# Patient Record
Sex: Female | Born: 1990
Health system: Southern US, Community
[De-identification: ages and names within clinical notes are randomized; demographics above are authoritative.]

## PROBLEM LIST (undated history)

## (undated) ENCOUNTER — Inpatient Hospital Stay (HOSPITAL_COMMUNITY): Payer: Self-pay

## (undated) DIAGNOSIS — B379 Candidiasis, unspecified: Secondary | ICD-10-CM

## (undated) DIAGNOSIS — E7800 Pure hypercholesterolemia, unspecified: Secondary | ICD-10-CM

## (undated) DIAGNOSIS — R011 Cardiac murmur, unspecified: Secondary | ICD-10-CM

## (undated) DIAGNOSIS — I10 Essential (primary) hypertension: Secondary | ICD-10-CM

## (undated) DIAGNOSIS — D573 Sickle-cell trait: Secondary | ICD-10-CM

## (undated) DIAGNOSIS — IMO0001 Reserved for inherently not codable concepts without codable children: Secondary | ICD-10-CM

## (undated) DIAGNOSIS — M419 Scoliosis, unspecified: Secondary | ICD-10-CM

## (undated) DIAGNOSIS — D649 Anemia, unspecified: Secondary | ICD-10-CM

## (undated) HISTORY — DX: Candidiasis, unspecified: B37.9

## (undated) HISTORY — DX: Scoliosis, unspecified: M41.9

## (undated) HISTORY — PX: NO PAST SURGERIES: SHX2092

## (undated) HISTORY — DX: Reserved for inherently not codable concepts without codable children: IMO0001

---
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
## 2008-02-15 ENCOUNTER — Ambulatory Visit (HOSPITAL_COMMUNITY): Admission: RE | Admit: 2008-02-15 | Discharge: 2008-02-15 | Payer: Self-pay | Admitting: Family Medicine

## 2008-02-18 ENCOUNTER — Ambulatory Visit: Payer: Self-pay | Admitting: Obstetrics and Gynecology

## 2008-02-18 ENCOUNTER — Inpatient Hospital Stay (HOSPITAL_COMMUNITY): Admission: AD | Admit: 2008-02-18 | Discharge: 2008-02-18 | Payer: Self-pay | Admitting: Family Medicine

## 2008-08-20 ENCOUNTER — Inpatient Hospital Stay (HOSPITAL_COMMUNITY): Admission: AD | Admit: 2008-08-20 | Discharge: 2008-08-22 | Payer: Self-pay | Admitting: Obstetrics and Gynecology

## 2009-01-11 ENCOUNTER — Inpatient Hospital Stay (HOSPITAL_COMMUNITY): Admission: AD | Admit: 2009-01-11 | Discharge: 2009-01-11 | Payer: Self-pay | Admitting: Obstetrics and Gynecology

## 2009-03-10 ENCOUNTER — Inpatient Hospital Stay (HOSPITAL_COMMUNITY): Admission: AD | Admit: 2009-03-10 | Discharge: 2009-03-10 | Payer: Self-pay | Admitting: Obstetrics and Gynecology

## 2009-07-18 ENCOUNTER — Inpatient Hospital Stay (HOSPITAL_COMMUNITY): Admission: AD | Admit: 2009-07-18 | Discharge: 2009-07-18 | Payer: Self-pay | Admitting: Obstetrics & Gynecology

## 2009-07-19 ENCOUNTER — Inpatient Hospital Stay (HOSPITAL_COMMUNITY): Admission: AD | Admit: 2009-07-19 | Discharge: 2009-07-20 | Payer: Self-pay | Admitting: Obstetrics and Gynecology

## 2009-08-05 ENCOUNTER — Inpatient Hospital Stay (HOSPITAL_COMMUNITY): Admission: AD | Admit: 2009-08-05 | Discharge: 2009-08-05 | Payer: Self-pay | Admitting: Obstetrics & Gynecology

## 2009-08-05 ENCOUNTER — Emergency Department (HOSPITAL_COMMUNITY): Admission: EM | Admit: 2009-08-05 | Discharge: 2009-08-05 | Payer: Self-pay | Admitting: Emergency Medicine

## 2009-12-19 ENCOUNTER — Inpatient Hospital Stay (HOSPITAL_COMMUNITY): Admission: AD | Admit: 2009-12-19 | Discharge: 2009-12-20 | Payer: Self-pay | Admitting: Obstetrics and Gynecology

## 2009-12-19 ENCOUNTER — Ambulatory Visit: Payer: Self-pay | Admitting: Nurse Practitioner

## 2010-06-20 LAB — URINALYSIS, ROUTINE W REFLEX MICROSCOPIC
Nitrite: NEGATIVE
Specific Gravity, Urine: 1.02 (ref 1.005–1.030)
pH: 7 (ref 5.0–8.0)

## 2010-06-20 LAB — CBC
HCT: 40.8 % (ref 36.0–46.0)
Hemoglobin: 13.2 g/dL (ref 12.0–15.0)
RDW: 12.9 % (ref 11.5–15.5)
WBC: 12.4 10*3/uL — ABNORMAL HIGH (ref 4.0–10.5)

## 2010-06-20 LAB — POCT PREGNANCY, URINE: Preg Test, Ur: NEGATIVE

## 2010-06-20 LAB — URINE MICROSCOPIC-ADD ON

## 2010-06-20 LAB — WET PREP, GENITAL: Yeast Wet Prep HPF POC: NONE SEEN

## 2010-06-26 LAB — URINALYSIS, ROUTINE W REFLEX MICROSCOPIC
Bilirubin Urine: NEGATIVE
Hgb urine dipstick: NEGATIVE
Specific Gravity, Urine: 1.02 (ref 1.005–1.030)
Urobilinogen, UA: 0.2 mg/dL (ref 0.0–1.0)

## 2010-06-26 LAB — WET PREP, GENITAL: Trich, Wet Prep: NONE SEEN

## 2010-06-26 LAB — GC/CHLAMYDIA PROBE AMP, GENITAL: GC Probe Amp, Genital: NEGATIVE

## 2010-07-09 LAB — CBC
HCT: 40.3 % (ref 36.0–46.0)
Hemoglobin: 13.7 g/dL (ref 12.0–15.0)
MCHC: 33.9 g/dL (ref 30.0–36.0)
MCV: 92 fL (ref 78.0–100.0)
RBC: 4.38 MIL/uL (ref 3.87–5.11)

## 2010-07-09 LAB — WET PREP, GENITAL: Clue Cells Wet Prep HPF POC: NONE SEEN

## 2010-07-09 LAB — URINALYSIS, ROUTINE W REFLEX MICROSCOPIC
Glucose, UA: NEGATIVE mg/dL
Hgb urine dipstick: NEGATIVE
Protein, ur: NEGATIVE mg/dL
Specific Gravity, Urine: 1.02 (ref 1.005–1.030)
pH: 7.5 (ref 5.0–8.0)

## 2010-07-09 LAB — DIFFERENTIAL
Basophils Relative: 0 % (ref 0–1)
Eosinophils Absolute: 0.1 10*3/uL (ref 0.0–0.7)
Lymphs Abs: 1.7 10*3/uL (ref 0.7–4.0)
Monocytes Absolute: 0.6 10*3/uL (ref 0.1–1.0)
Monocytes Relative: 10 % (ref 3–12)

## 2010-07-09 LAB — URINE MICROSCOPIC-ADD ON

## 2010-07-09 LAB — URINE CULTURE

## 2010-07-11 LAB — URINALYSIS, ROUTINE W REFLEX MICROSCOPIC
Ketones, ur: NEGATIVE mg/dL
Leukocytes, UA: NEGATIVE
Nitrite: POSITIVE — AB
Specific Gravity, Urine: >1.03 — ABNORMAL HIGH (ref 1.005–1.030)
Urobilinogen, UA: 0.2 mg/dL (ref 0.0–1.0)
pH: 6 (ref 5.0–8.0)

## 2010-07-11 LAB — URINE CULTURE: Colony Count: >=100000

## 2010-07-11 LAB — POCT PREGNANCY, URINE: Preg Test, Ur: NEGATIVE

## 2010-07-11 LAB — URINE MICROSCOPIC-ADD ON: RBC / HPF: NONE SEEN RBC/hpf (ref <3)

## 2010-07-16 LAB — CBC
Hemoglobin: 14.2 g/dL (ref 12.0–15.0)
MCHC: 35.8 g/dL (ref 30.0–36.0)
MCV: 95.7 fL (ref 78.0–100.0)
Platelets: 133 10*3/uL — ABNORMAL LOW (ref 150–400)
Platelets: 162 10*3/uL (ref 150–400)
RBC: 3.23 MIL/uL — ABNORMAL LOW (ref 3.87–5.11)
RDW: 12.7 % (ref 11.5–15.5)
WBC: 14.4 10*3/uL — ABNORMAL HIGH (ref 4.0–10.5)

## 2010-07-16 LAB — RPR
RPR Ser Ql: NONREACTIVE
RPR Ser Ql: NONREACTIVE

## 2010-08-20 NOTE — H&P (Signed)
NAME:  Allison Mathews, Allison Mathews            ACCOUNT NO.:  [id number]   MEDICAL RECORD NO.:  [id number]          PATIENT TYPE:  INP   LOCATION:  9165                          F[facility]ILITY:  [facility]   PHYSICIAN:  Hal Morales, M.D.DATE OF BIRTH:  1990-11-03   DATE OF ADMISSION:  08/20/2008  DATE OF DISCHARGE:                              HISTORY & PHYSICAL   Patient is a 20 year old, single, black female, gravida 2, para 0-0-1-0  at 39-6/7 weeks per an [facility] of Aug 21, 2008.  Presents in active labor.  Denies leakage of fluid.  Reports good fetal movement.  Irregular  contractions since last night before going to bed with a worsening pain  around 5 a.m., was able to sleep some during the night.  She was 2 cm  and 100% effaced in the office at the end of the week.  Followed by CNM  service at [facility].   HISTORY:  Remarkable for:  1. Recurrent UTIs.  2. GBS positive.  3. Mild scoliosis.  4. Questionable support and psychosocial issues.   PRENATAL LABS:  Blood type O+.  Rh antibody screen negative.  Sickle  cell screen negative.  RPR nonreactive.  Rubella titer immune.  Hepatitis surface antigen nonreactive.  HIV nonreactive.  Hepatitis C  was negative.  Urine culture March 18, 2008, was positive for staph.  HSV II is negative.  GC and chlamydia negative.  Quad screen was done  March 08, 2008, and was within normal limits.  Hemoglobin March 18, 2008, 12.5, hematocrit 32.2, platelets were 220.   ALLERGIES:  DENIES MEDICATION OR LATEX ALLERGIES.   MENSTRUAL HISTORY:  Menarche at age 81.  Has irregular cycles, usually  about 60 days in between periods with 5 days of flow when they do come.  Reported an LMP of November 15, 2007, gave her an [facility] of Aug 23, 2008, by  ultrasound, was given due date of Aug 21, 2008.   OBSTETRICAL HISTORY:  Gravida 1.  Spontaneous abortion at 6 to 8 weeks  that was in 2008, passed naturally, no complications.  Gravida 2 is  current pregnancy.   PAST MEDICAL  HISTORY:  For contraception, she has used condoms in the  past.  Prior to this pregnancy, she had never had a Pap smear.  Reports  varicella as a child.  Reported diagnosed with a heart murmur in 2009,  had an echocardiogram around 13 weeks.   GENETIC HISTORY:  Unremarkable.   FAMILY HISTORY:  Paternal aunt, high blood pressure and on medication.  Sister, anemia.  Dad, paternal grandmother, and paternal aunt, diabetic.   SOCIAL HISTORY:  She is a single black female.  She is of [place]  faith.  The father of the baby's name is Barrie Lyme.  Patient has had a  high school education and works part time at [employer].  Father of the baby  is unemployed.  Denied alcohol, tobacco, or illicit drug use.   HISTORY OF PRESENT PREGNANCY:  She had her new OB interview around 69-  1/7 weeks on March 07, 2008.  Returned on March 08, 2008, for her  workup.  She reported a pregravid weight of 115.  She had a normal BMI.  Was treated for a UTI after being seen at [facility].  She did  receive H1N1 on March 08, 2008.  Also treated for a yeast infection.  Had Pap smear on March 13, 2008, Pap was within normal limits.  Had  anatomy ultrasound at 18-3/7 weeks on March 23, 2008, and size was  equal to dates.  Cervix was 3.13 cm.  Anatomy within normal limits and a  posterior placenta.  Did have episode of diarrhea April 03, 2008.  Had her Glucola at 27-3/7 weeks and it was within normal limits, equal  to 106.  Hemoglobin was 12.3.  RPR was negative.  She had some  intermittent contractions around 28 weeks.  She had a fetal fibronectin  that was negative at that time.  Gonorrhea and chlamydia cultures were  negative and her cervix was closed and long.  Had an ultrasound at 32  and 3 for decreased weight gain.  AFI was 40th percentile, weight was 4  pounds, and cervix was 3.62.  had some issues with her appetite in 3rd  trimester.  She did measure size equal to dates.  Her prenatal record   available ends at 34-3/7 weeks.  Her weight gain at that point was 17  pounds overall.  GBS is positive in 3rd trimester.   OBJECTIVE:  VITAL SIGNS:  On admission, 128/76, heart rate 85,  respirations 20, temperature 98.4.  Fetal heart rate 140, reactive,  occasional variable.  TOCO.  Uterine contractions every 1 to 3 minutes.  Moderate on palpation.   PHYSICAL EXAM:  GENERAL:  She did have some labored breathing but was  coping well with her contractions, was alert and oriented x3.  HEENT:  Within normal limits and grossly intact.  CARDIOVASCULAR:  Regular rate and rhythm.  LUNGS:  Clear to auscultation bilaterally.  ABDOMEN:  Soft, nontender, and gravid.  Cervix per an RN exam was 8,  100%, -1 to 0 station.  EXTREMITIES:  No edema, no clonus, and DTRs were 1 to 2+.   IMPRESSION:  1. Intrauterine pregnancy at 39-6/7 weeks.  2. GBS positive.  3. Active labor.  4. Desires epidural.   PLAN:  1. Admitted to birthing suites with Dr. Pennie Rushing as attending      physician.  2. Routine L and D orders.  3. Ampicillin IV per GBS protocol for advanced dilatation.  4. Epidural as soon as possible.  5. We will perform artificial rupture of membranes when comfortable      after epidural.      Candice Denny Levy, CNM      Hal Morales, M.D.  Electronically Signed    CHS/MEDQ  D:  08/20/2008  T:  08/20/2008  Job:  [id number]

## 2010-08-20 NOTE — Op Note (Signed)
NAME:  Allison Mathews, Allison Mathews            ACCOUNT NO.:  [id number]   MEDICAL RECORD NO.:  [id number]          PATIENT TYPE:  INP   LOCATION:  9106                          [facility]  [facility]   PHYSICIAN:  Hal Morales, M.D.DATE OF BIRTH:  [birth year]   DATE OF PROCEDURE:  08/20/2008  DATE OF DISCHARGE:                               OPERATIVE REPORT   PREOPERATIVE DIAGNOSES:  Intrauterine pregnancy at term, prolonged  second stage of labor, terminal bradycardia.   POSTOPERATIVE DIAGNOSES:  Intrauterine pregnancy at term, prolonged  second stage of labor, terminal bradycardia, double foot umbilical cord.   OPERATION:  Vacuum-assisted vaginal delivery, repair of second-degree  labial laceration.   ANESTHESIA:  Epidural and local.   ESTIMATED BLOOD LOSS:  Less than 500 mL.   COMPLICATIONS:  None.   FINDINGS:  The patient was delivered of a female infant whose name is  [name], weighing 6 pounds 8 ounces with Apgars of 8 and 9 and 1 and 5  minutes respectively.   PROCEDURE IN DETAIL:  The patient had been pushing initially for  approximately 1 hour and then requested resting.  She began pushing  again and after about 30 minutes of intermittent variable decelerations  began to have persistent decrease in the fetal heart rate baseline to  the 100-110 baseline with decreases down as low as the 70s.  At this  point, the fetus was in the ROA position and was at +3 station.  A  discussion had been held with the parents, concerning options for  management of either prolonged second stage or nonreassuring fetal heart  rate tracing and that those options included vacuum-assisted vaginal  delivery and cesarean section.  The risks of each had been explained to  them and they had decided that should that situational occur, they  wanted to start with an attempted vacuum-assisted vaginal delivery.  It  was thus advised that the patient be prepared for vacuum and she agreed.  Her Foley catheter had been  in place during her pushing and was removed  just before her delivery.  Once the Foley catheter had been removed with  the patient in lithotomy position, the Kiwi vacuum extractor was placed  over the fetal vertex and with a single pole.  The vertex was brought  over the perineum.  The vacuum was released and the remainder of the  infant delivered with a combination of maternal expulsive efforts and  gentle traction.  There was noted to be a double loop of umbilical cord  tightly around the infant's left button.  This was reduced and the  infant suctioned.  The cord was then clamped and was cut by the father  of the baby.  The infant was placed in the isolette for evaluation by  the attending pediatricians.  The placenta was then delivered with a  combination of maternal expulsive efforts and gentle traction, and given  to the employees of [employer] for cord blood  storage.  There was a small second-degree laceration of the left labium  that was repaired in a running fashion with a suture of 3-0 Vicryl.  An  ice pack was then applied to the perineum.  The mother was left to  recover in the LDR area and the infant was left for initial infant  bonding with expected admission to the full-term nursery.       Hal Morales, M.D.  Electronically Signed     VPH/MEDQ  D:  08/20/2008  T:  08/21/2008  Job:  [id number]

## 2011-01-02 LAB — URINALYSIS, ROUTINE W REFLEX MICROSCOPIC
Bilirubin Urine: NEGATIVE
Nitrite: NEGATIVE
Specific Gravity, Urine: 1.02
Urobilinogen, UA: 0.2
pH: 6

## 2011-01-03 LAB — HCG, SERUM, QUALITATIVE: Preg, Serum: NEGATIVE

## 2011-01-07 LAB — URINE MICROSCOPIC-ADD ON

## 2011-01-07 LAB — URINALYSIS, ROUTINE W REFLEX MICROSCOPIC
Leukocytes, UA: NEGATIVE
Nitrite: POSITIVE — AB
Specific Gravity, Urine: >1.03 — ABNORMAL HIGH
pH: 5.5

## 2011-01-07 LAB — CBC
Hemoglobin: 12.6
Platelets: 203
RDW: 12.7

## 2011-01-07 LAB — URINE CULTURE: Colony Count: >=100000

## 2011-01-13 LAB — HCG, QUANTITATIVE, PREGNANCY: hCG, Beta Chain, Quant, S: 630 — ABNORMAL HIGH

## 2011-01-13 LAB — GC/CHLAMYDIA PROBE AMP, GENITAL
Chlamydia, DNA Probe: NEGATIVE
GC Probe Amp, Genital: NEGATIVE

## 2011-01-13 LAB — WET PREP, GENITAL
Trich, Wet Prep: NONE SEEN
WBC, Wet Prep HPF POC: NONE SEEN
Yeast Wet Prep HPF POC: NONE SEEN

## 2011-01-13 LAB — ABO/RH: ABO/RH(D): O POS

## 2011-01-13 LAB — CBC
MCHC: 35.2
Platelets: 236
RDW: 12.6

## 2011-03-01 ENCOUNTER — Inpatient Hospital Stay (HOSPITAL_COMMUNITY)
Admission: AD | Admit: 2011-03-01 | Discharge: 2011-03-01 | Disposition: A | Discharge status: Home / Self Care | Payer: Self-pay | Source: Ambulatory Visit | Attending: Obstetrics and Gynecology | Admitting: Obstetrics and Gynecology

## 2011-03-01 ENCOUNTER — Encounter (HOSPITAL_COMMUNITY): Payer: Self-pay | Admitting: *Deleted

## 2011-03-01 DIAGNOSIS — M549 Dorsalgia, unspecified: Secondary | ICD-10-CM | POA: Insufficient documentation

## 2011-03-01 DIAGNOSIS — R109 Unspecified abdominal pain: Secondary | ICD-10-CM | POA: Insufficient documentation

## 2011-03-01 DIAGNOSIS — N946 Dysmenorrhea, unspecified: Secondary | ICD-10-CM | POA: Insufficient documentation

## 2011-03-01 HISTORY — DX: Cardiac murmur, unspecified: R01.1

## 2011-03-01 HISTORY — DX: Anemia, unspecified: D64.9

## 2011-03-01 LAB — WET PREP, GENITAL
Clue Cells Wet Prep HPF POC: NONE SEEN
Yeast Wet Prep HPF POC: NONE SEEN

## 2011-03-01 LAB — URINE MICROSCOPIC-ADD ON

## 2011-03-01 LAB — URINALYSIS, ROUTINE W REFLEX MICROSCOPIC
Bilirubin Urine: NEGATIVE
Glucose, UA: NEGATIVE mg/dL
Protein, ur: NEGATIVE mg/dL
Urobilinogen, UA: 0.2 mg/dL (ref 0.0–1.0)

## 2011-03-01 LAB — POCT PREGNANCY, URINE: Preg Test, Ur: NEGATIVE

## 2011-03-01 MED ORDER — IBUPROFEN 400 MG PO TABS
400.0000 mg | ORAL_TABLET | Freq: Once | ORAL | Status: AC
  Administered 2011-03-01: 400 mg via ORAL
  Filled 2011-03-01: qty 1

## 2011-03-01 NOTE — Progress Notes (Signed)
Patient presents wit onset of sharp pains around her back to her stomach since yesterday thinks started her period today was late her period.

## 2011-03-01 NOTE — ED Notes (Signed)
Pamelia Hoit RNP in with pt to discuss plan of care

## 2011-04-08 NOTE — L&D Delivery Note (Signed)
Delivery Note Pt complete at 1754, and with urge to push.  Pushing started at around 1816.  FHT with some variables and brief bradycardia while pushing, but good recovery w/ position changes and moderate variability throughout.  At 7:04 PM a viable female "Allison Mathews" was delivered via Vaginal, Spontaneous Delivery (Presentation: ; Occiput Anterior).  APGAR: 8, 9; weight-not done at time of this note.  Newborn w/ spontaneous, lusty cry as body delivered.     Placenta status: Intact, Spontaneous, Schultz.  Cord: 3 vessels with the following complications: None.  Cord pH: not collected  Anesthesia: Epidural  Episiotomy: None Lacerations: None Suture Repair: n/a Est. Blood Loss (mL): 150  Mom to postpartum.  Baby to nursery-stable. Plans outpt circ and to breastfeed.  [name] H 12/29/2011, 7:30 PM

## 2011-05-23 ENCOUNTER — Inpatient Hospital Stay (HOSPITAL_COMMUNITY)
Admission: AD | Admit: 2011-05-23 | Discharge: 2011-05-24 | Disposition: A | Discharge status: Home / Self Care | Payer: Self-pay | Source: Ambulatory Visit | Attending: Obstetrics & Gynecology | Admitting: Obstetrics & Gynecology

## 2011-05-23 ENCOUNTER — Encounter (HOSPITAL_COMMUNITY): Payer: Self-pay | Admitting: Obstetrics and Gynecology

## 2011-05-23 DIAGNOSIS — O219 Vomiting of pregnancy, unspecified: Secondary | ICD-10-CM

## 2011-05-23 DIAGNOSIS — K3189 Other diseases of stomach and duodenum: Secondary | ICD-10-CM | POA: Insufficient documentation

## 2011-05-23 DIAGNOSIS — O210 Mild hyperemesis gravidarum: Secondary | ICD-10-CM | POA: Insufficient documentation

## 2011-05-23 DIAGNOSIS — Z3490 Encounter for supervision of normal pregnancy, unspecified, unspecified trimester: Secondary | ICD-10-CM

## 2011-05-23 DIAGNOSIS — Z1389 Encounter for screening for other disorder: Secondary | ICD-10-CM

## 2011-05-23 DIAGNOSIS — K30 Functional dyspepsia: Secondary | ICD-10-CM

## 2011-05-23 DIAGNOSIS — R1013 Epigastric pain: Secondary | ICD-10-CM | POA: Insufficient documentation

## 2011-05-23 LAB — POCT PREGNANCY, URINE: Preg Test, Ur: POSITIVE — AB

## 2011-05-23 MED ORDER — FAMOTIDINE 20 MG PO TABS
20.0000 mg | ORAL_TABLET | Freq: Once | ORAL | Status: AC
  Administered 2011-05-24: 20 mg via ORAL
  Filled 2011-05-23: qty 1

## 2011-05-23 NOTE — Progress Notes (Signed)
Pt  States, " I've had nausea and acid reflux for a couple of weeks. I missed my period  And had a positive HPT."

## 2011-05-23 NOTE — ED Provider Notes (Signed)
Chief Complaint:  Nausea and Possible Pregnancy   Allison Mathews is  21 y.o. G2P1001.  Patient's last menstrual period was 03/31/2011.Marland Kitchen  Her pregnancy status is positive. [redacted]w[redacted]d by LMP  She presents complaining of Nausea and Possible Pregnancy  Reports nausea and heartburn x 1 week. +UPT at home uncertain what to take. Desires confirmation of pregnancy and medication for nausea. Denies vaginal bleeding, discharge, abd pain, or urinary symptoms  Obstetrical/Gynecological History: OB History    Grav Para Term Preterm Abortions TAB SAB Ect Mult Living   2 1 1       1       Past Medical History: Past Medical History  Diagnosis Date  . Anemia   . Heart murmur     Past Surgical History: Past Surgical History  Procedure Date  . No past surgeries     Family History: Family History  Problem Relation Age of Onset  . Anesthesia problems Neg Hx   . Hypotension Neg Hx   . Malignant hyperthermia Neg Hx   . Pseudochol deficiency Neg Hx   . Diabetes Father   . Hypertension Paternal Aunt   . Diabetes Paternal Grandmother     Social History: History  Substance Use Topics  . Smoking status: Never Smoker   . Smokeless tobacco: Never Used  . Alcohol Use: No    Allergies: No Known Allergies  No prescriptions prior to admission    Review of Systems - Negative except what has been reviewed in HPI  Physical Exam   Blood pressure 124/76, pulse 56, temperature 98.7 F (37.1 C), temperature source Oral, resp. rate 16, height 4\' 11"  (1.499 m), weight 138 lb (62.596 kg), last menstrual period 03/31/2011.  General: General appearance - alert, well appearing, and in no distress, oriented to person, place, and time and normal appearing weight Mental status - alert, oriented to person, place, and time, normal mood, behavior, speech, dress, motor activity, and thought processes, affect appropriate to mood Abdomen - soft, nontender, nondistended, no masses or organomegaly no CVA  tenderness Focused Gynecological Exam: examination not indicated  Labs: Recent Results (from the past 24 hour(s))  POCT PREGNANCY, URINE   Collection Time   05/23/11 11:20 PM      Component Value Range   Preg Test, Ur POSITIVE (*) NEGATIVE    Imaging Studies:  Bedside US: IUP + yolk sac, + embryo with cardiac activity. CRL: [redacted]w[redacted]d. Images archived c/w LMP dating.    Assessment: IUP Nausea Indigestion  Plan: Discharge home Pregnancy verification letter, MCD info, and provider list given Rx Phenergan and pepcid sent to pharmacy Precautions reviewed  Monty Spicher E. 05/23/2011,11:57 PM

## 2011-05-24 ENCOUNTER — Encounter (HOSPITAL_COMMUNITY): Payer: Self-pay | Admitting: Physician Assistant

## 2011-05-24 MED ORDER — PROMETHAZINE HCL 25 MG PO TABS: 25.0000 mg | ORAL_TABLET | Freq: Four times a day (QID) | ORAL | Status: DC | PRN

## 2011-05-24 MED ORDER — FAMOTIDINE 20 MG PO TABS: 20.0000 mg | ORAL_TABLET | Freq: Two times a day (BID) | ORAL | Status: DC

## 2011-05-24 NOTE — Discharge Instructions (Signed)
Indigestion  Indigestion is an upset stomach. It can happen when you eat too much, too fast, or when you eat a type of food your body does not react well to. HOME CARE  Sit upright when you have indigestion.   Avoid foods that have caused indigestion in the past.   Eat small meals.   Do not eat within 3 hours of going to bed.   Avoid wearing clothes that are tight around your chest or belly (abdomen).   Stop smoking if you smoke.   Limit stress.   Eat a healthy diet. Avoid spicy and fatty foods, peppermint, tomato products, and citrus or fruit juices.   Limit alcohol and caffeine.   Avoid aspirin and other anti-inflammatory drugs.  GET HELP RIGHT AWAY IF:  You have chest pressure.   You have pain that spreads into your neck, arms, back, or upper belly.   You have trouble swallowing or breathing.   You keep throwing up (vomiting) or have blood in your poop (stool).   You have a temperature by mouth above 102 F (38.9 C), not controlled by medicine.   You become dizzy or pass out (faint).  MAKE SURE YOU:  Understand these instructions.   Will watch your condition.   Will get help right away if you are not doing well or get worse.  Document Released: 04/26/2010 Document Revised: 10/07/2010 Document Reviewed: 04/26/2010 [facility] Patient Information 2012 [place], [place].Morning Sickness Morning sickness is when you feel sick to your stomach (nauseous) during pregnancy. This nauseous feeling may or may not come with throwing up (vomiting). It often occurs in the morning, but can be a problem any time of day. While morning sickness is unpleasant, it is usually harmless unless you develop severe and continual vomiting (hyperemesis gravidarum). This condition requires more intense treatment. CAUSES  The cause of morning sickness is not completely known but seems to be related to a sudden increase of two hormones:   Human chorionic gonadotropin (hCG).   Estrogen hormone.    These are elevated in the first part of the pregnancy. TREATMENT  Do not use any medicines (prescription, over-the-counter, or herbal) for morning sickness without first talking to your caregiver. Some patients are helped by the following:  Vitamin B6 (25mg  every 8 hours) or vitamin B6 shots.   An antihistamine called doxylamine (10mg  every 8 hours).   The herbal medication ginger.  HOME CARE INSTRUCTIONS   Taking multivitamins before getting pregnant can prevent or decrease the severity of morning sickness in most women.   Eat a piece of dry toast or unsalted crackers before getting out of bed in the morning.   Eat 5 or 6 small meals a day.   Eat dry and bland foods (rice, baked potato).   Do not drink liquids with your meals. Drink liquids between meals.   Avoid greasy, fatty, and spicy foods.   Get someone to cook for you if the smell of any food causes nausea and vomiting.   Avoid vitamin pills with iron because iron can cause nausea.   Snack on protein foods between meals if you are hungry.   Eat unsweetened gelatins for deserts.   Wear an acupressure wristband (worn for sea sickness) may be helpful.   Acupuncture may be helpful.   Do not smoke.   Get a humidifier to keep the air in your house free of odors.  SEEK MEDICAL CARE IF:   Your home remedies are not working and you need medication.  You feel dizzy or lightheaded.   You are losing weight.   You need help with your diet.  SEEK IMMEDIATE MEDICAL CARE IF:   You have persistent and uncontrolled nausea and vomiting.   You pass out (faint).   You have a fever.  MAKE SURE YOU:   Understand these instructions.   Will watch your condition.   Will get help right away if you are not doing well or get worse.  Document Released: 05/15/2006 Document Revised: 12/04/2010 Document Reviewed: 03/12/2007 [employer] Patient Information 2012 [place].   ________________________________________     To schedule your Maternity Eligibility Appointment, please call [phone].  When you arrive for your appointment you must bring the following items or information listed below.  Your appointment will be rescheduled if you do not have these items or are 15 minutes late. If currently receiving Medicaid, you MUST bring: 1. Medicaid Card 2. Social Security Card 3. Picture ID 4. Proof of Pregnancy 5. Verification of current address if the address on Medicaid card is incorrect "postmarked mail" If not receiving Medicaid, you MUST bring: 1. Social Security Card 2. Picture ID 3. Birth Certificate (if available) Passport or *Green Card 4. Proof of Pregnancy 5. Verification of current address "postmarked mail" for each income presented. 6. Verification of insurance coverage, if any 7. Check stubs from each employer for the previous month (if unable to present check stub  for each week, we will accept check stub for the first and last week ill the same month.) If you can't locate check stubs, you must bring a letter from the employer(s) and it must have the following information on letterhead, typed, in English: o name of company o company telephone number o how long been with the company, if less than one month o how much person earns per hour o how many hours per week work o the gross pay the person earned for the previous month If you are 23 years old or less, you do not have to bring proof of income unless you work or live with the father of the baby and at that time we will need proof of income from you and/or the father of the baby. Green Card recipients are eligible for Medicaid for Pregnant Women (MPW)   Prenatal Care Providers [facility]    [facility]  [phone]     Phone: [phone]          [facility]                      [facility]  @[place]     Phone: [phone]  Phone: [phone]         [facility]     Phone: [phone]  Phone: [phone]           [facility] [place]                hone: [phone]  Phone: [phone]         [facility]      Phone: [phone]  Phone: [phone]         [facility]                Phone: [phone]  [facility]   Phone:[phone]    [facility] [address] [place])  Phone: [phone] [facility]   Phone: [phone]

## 2011-06-28 NOTE — ED Provider Notes (Signed)
Medical Screening exam and patient care preformed by advanced practice provider.  Agree with the above management.  

## 2011-07-03 ENCOUNTER — Encounter (HOSPITAL_COMMUNITY): Payer: Self-pay | Admitting: *Deleted

## 2011-07-03 ENCOUNTER — Inpatient Hospital Stay (HOSPITAL_COMMUNITY)
Admission: AD | Admit: 2011-07-03 | Discharge: 2011-07-03 | Disposition: A | Discharge status: Home / Self Care | Payer: Medicaid Other | Source: Ambulatory Visit | Attending: Family Medicine | Admitting: Family Medicine

## 2011-07-03 DIAGNOSIS — R1013 Epigastric pain: Secondary | ICD-10-CM | POA: Insufficient documentation

## 2011-07-03 DIAGNOSIS — O99891 Other specified diseases and conditions complicating pregnancy: Secondary | ICD-10-CM | POA: Insufficient documentation

## 2011-07-03 DIAGNOSIS — R0602 Shortness of breath: Secondary | ICD-10-CM | POA: Insufficient documentation

## 2011-07-03 DIAGNOSIS — J069 Acute upper respiratory infection, unspecified: Secondary | ICD-10-CM | POA: Insufficient documentation

## 2011-07-03 DIAGNOSIS — O219 Vomiting of pregnancy, unspecified: Secondary | ICD-10-CM

## 2011-07-03 HISTORY — DX: Sickle-cell trait: D57.3

## 2011-07-03 LAB — URINALYSIS, ROUTINE W REFLEX MICROSCOPIC
Bilirubin Urine: NEGATIVE
Glucose, UA: NEGATIVE mg/dL
Hgb urine dipstick: NEGATIVE
Protein, ur: NEGATIVE mg/dL
Specific Gravity, Urine: 1.025 (ref 1.005–1.030)
Urobilinogen, UA: 0.2 mg/dL (ref 0.0–1.0)

## 2011-07-03 MED ORDER — IPRATROPIUM BROMIDE 0.02 % IN SOLN
0.5000 mg | Freq: Once | RESPIRATORY_TRACT | Status: AC
  Administered 2011-07-03: 0.5 mg via RESPIRATORY_TRACT
  Filled 2011-07-03: qty 2.5

## 2011-07-03 MED ORDER — ONDANSETRON 8 MG PO TBDP: 8.0000 mg | ORAL_TABLET | Freq: Three times a day (TID) | ORAL | Status: AC | PRN

## 2011-07-03 MED ORDER — RANITIDINE HCL 150 MG PO CAPS: 150.0000 mg | ORAL_CAPSULE | Freq: Every day | ORAL | Status: DC

## 2011-07-03 MED ORDER — ALBUTEROL SULFATE (5 MG/ML) 0.5% IN NEBU
2.5000 mg | INHALATION_SOLUTION | Freq: Once | RESPIRATORY_TRACT | Status: AC
  Administered 2011-07-03: 2.5 mg via RESPIRATORY_TRACT
  Filled 2011-07-03: qty 0.5

## 2011-07-03 NOTE — MAU Note (Signed)
Denies urinary symptoms

## 2011-07-03 NOTE — MAU Provider Note (Signed)
Chart reviewed and agree with management and plan.  

## 2011-07-03 NOTE — MAU Note (Signed)
Pt brought back directly.  No apparent distress.  Breathing with mouth closed. Did not need to pause when speaking.  Cramping for about 2 wks, upper abd.  Breathing problem since beginning of preg, hx of allergies, thinks it might be asthma.

## 2011-07-03 NOTE — MAU Provider Note (Signed)
History     CSN: [id number]  Arrival date & time 07/03/11  4098   None     Chief Complaint  Patient presents with  . Shortness of Breath   HPI Allison Mathews is a 21 y.o. female @ [redacted]w[redacted]d gestation who presents to MAU for feeling like wheezing.  No history of asthma but has allergies and afraid she may be developing asthma. She states that SOB comes and goes as random and is worse after eating and when lying down to sleep for approximately the past 2 weeks. SOB is accompanied by my pressure in lower sternal/epigastric area which is improved when patient lifts breasts off abdomen with hands.  In addition patient c/o of intermittent diffuse abdominal cramping.  Reports hx of heart murmur she states was previously evaluated by cardiology without any indicated intervention.  She denies CP, leg pain or swelling, dysuria, hematuria, vaginal bleeding or discharge.  Previously prescribed phenergen and pepcid for indigestion by MAU provided but decided to discontinue use 2 weeks ago because she felt that medications made her drowsy.  Past Medical History  Diagnosis Date  . Anemia   . Heart murmur   . Sickle cell trait     Past Surgical History  Procedure Date  . No past surgeries     Family History  Problem Relation Age of Onset  . Anesthesia problems Neg Hx   . Hypotension Neg Hx   . Malignant hyperthermia Neg Hx   . Pseudochol deficiency Neg Hx   . Diabetes Father   . Hypertension Paternal Aunt   . Diabetes Paternal Grandmother     History  Substance Use Topics  . Smoking status: Never Smoker   . Smokeless tobacco: Never Used  . Alcohol Use: No    OB History    Grav Para Term Preterm Abortions TAB SAB Ect Mult Living   2 1 1       1       Review of Systems  Constitutional: Positive for fatigue (patient reports mild decrease in energy level). Negative for fever, chills, diaphoresis, activity change and appetite change.  HENT: Negative for congestion, facial swelling,  rhinorrhea, neck pain and neck stiffness.   Eyes: Negative for visual disturbance.  Respiratory: Positive for chest tightness (mild lower sternal/epigastric intermittent pressure), shortness of breath and wheezing. Negative for apnea, cough, choking and stridor.   Cardiovascular: Negative for chest pain, palpitations and leg swelling.  Gastrointestinal: Negative for abdominal distention.  Genitourinary: Positive for frequency. Negative for dysuria, urgency, hematuria, flank pain, vaginal bleeding, vaginal discharge, difficulty urinating, vaginal pain and pelvic pain.  Musculoskeletal: Negative for joint swelling and gait problem.  Neurological: Negative for dizziness, tremors, seizures, syncope, facial asymmetry, speech difficulty, weakness, light-headedness, numbness and headaches.  Hematological: Negative for adenopathy.  Psychiatric/Behavioral: Negative for hallucinations, behavioral problems, confusion, dysphoric mood, decreased concentration and agitation.    Allergies  Review of patient's allergies indicates no known allergies.  Home Medications   Current Outpatient Rx  Name Route Sig Dispense Refill  . PROMETHAZINE HCL 25 MG PO TABS Oral Take 1 tablet (25 mg total) by mouth every 6 (six) hours as needed for nausea. 30 tablet 3    BP 127/65  Pulse 75  Temp(Src) 98.1 F (36.7 C) (Oral)  Resp 18  Ht 4\' 11"  (1.499 m)  Wt 137 lb (62.143 kg)  BMI 27.67 kg/m2  SpO2 100%  LMP 03/31/2011  Physical Exam  Constitutional: She is oriented to person, place, and time.  She appears well-developed and well-nourished. No distress.  HENT:  Head: Normocephalic and atraumatic.  Eyes: Pupils are equal, round, and reactive to light.  Neck: Normal range of motion. Neck supple. No JVD present. No tracheal deviation present.  Cardiovascular: Normal rate, regular rhythm and normal heart sounds.   Pulmonary/Chest: No stridor. No respiratory distress (shallow respirations, breath sounds diminished  bilaterally, pt able to speak full sentences, no acessory muscle use, pt laughing when using peak flow meter). She has no wheezes. She has no rales. She exhibits no tenderness.  Abdominal: Soft. Bowel sounds are normal. She exhibits no distension. There is tenderness (mild epigastric tenderness). There is no rebound and no guarding.  Musculoskeletal: Normal range of motion.  Lymphadenopathy:    She has no cervical adenopathy.  Neurological: She is alert and oriented to person, place, and time.  Skin: Skin is warm and dry. She is not diaphoretic.  Psychiatric: She has a normal mood and affect. Her behavior is normal. Judgment and thought content normal.   Doppler FHT 163  Results for orders placed during the hospital encounter of 07/03/11 (from the past 24 hour(s))  URINALYSIS, ROUTINE W REFLEX MICROSCOPIC     Status: Abnormal   Collection Time   07/03/11  9:20 AM      Component Value Range   Color, Urine YELLOW  YELLOW    APPearance CLEAR  CLEAR    Specific Gravity, Urine 1.025  1.005 - 1.030    pH 6.0  5.0 - 8.0    Glucose, UA NEGATIVE  NEGATIVE (mg/dL)   Hgb urine dipstick NEGATIVE  NEGATIVE    Bilirubin Urine NEGATIVE  NEGATIVE    Ketones, ur 15 (*) NEGATIVE (mg/dL)   Protein, ur NEGATIVE  NEGATIVE (mg/dL)   Urobilinogen, UA 0.2  0.0 - 1.0 (mg/dL)   Nitrite NEGATIVE  NEGATIVE    Leukocytes, UA NEGATIVE  NEGATIVE    ED Course  Procedures UA  MDM  Plans prenatal care with [facility].  Assessment: Epigastric pain   URI  Plan:  Albuterol/atrovent tx and patient states helped a lot   Peak Flow 400   Rx Zofran   Rx Zantac   Follow up with [facility] as planned   Return here as needed.

## 2011-07-03 NOTE — Discharge Instructions (Signed)
Morning Sickness Morning sickness is when you feel sick to your stomach (nauseous) during pregnancy. You may feel sick to your stomach and throw up (vomit). You may feel sick in the morning, but you can feel this way any time of day. Some women feel very sick to their stomach and cannot stop throwing up (hyperemesis gravidarum). HOME CARE  Take multivitamins as told by your doctor. Taking multivitamins before getting pregnant can stop or lessen the harshness of morning sickness.   Eat dry toast or unsalted crackers before getting out of bed.   Eat 5 to 6 small meals a day.   Eat dry and bland foods like rice and baked potatoes.   Do not drink liquids with meals. Drink between meals.   Do not eat greasy, fatty, or spicy foods.   Have someone cook for you if the smell of food causes you to feel sick or throw up.   Do not take vitamins with iron, or as told by your doctor.   Eat protein when you need a snack (nuts, yogurt, cheese).   Eat unsweetened gelatins for dessert.   Wear a bracelet used for sea sickness (acupressure wristband).   Go to a doctor that puts thin needles into certain body points (acupuncture) to improve how you feel.   Do not smoke.   Use a humidifier to keep the air in your house free of odors.  GET HELP RIGHT AWAY IF:   You feel very sick to your stomach and cannot stop throwing up.   You pass out (faint).   You have a fever.   You need medicine to feel better.   You feel dizzy or lightheaded.   You are losing weight.   You need help knowing what to eat and what not to eat.  MAKE SURE YOU:   Understand these instructions.   Will watch your condition.   Will get help right away if you are not doing well or get worse.  Document Released: 05/01/2004 Document Revised: 03/13/2011 Document Reviewed: 06/21/2009 [facility] Patient Information 2012 [place], [place].Upper Respiratory Infection, Adult An upper respiratory infection (URI) is also sometimes  known as the common cold. The upper respiratory tract includes the nose, sinuses, throat, trachea, and bronchi. Bronchi are the airways leading to the lungs. Most people improve within 1 week, but symptoms can last up to 2 weeks. A residual cough may last even longer.  CAUSES Many different viruses can infect the tissues lining the upper respiratory tract. The tissues become irritated and inflamed and often become very moist. Mucus production is also common. A cold is contagious. You can easily spread the virus to others by oral contact. This includes kissing, sharing a glass, coughing, or sneezing. Touching your mouth or nose and then touching a surface, which is then touched by another person, can also spread the virus. SYMPTOMS  Symptoms typically develop 1 to 3 days after you come in contact with a cold virus. Symptoms vary from person to person. They may include:  Runny nose.   Sneezing.   Nasal congestion.   Sinus irritation.   Sore throat.   Loss of voice (laryngitis).   Cough.   Fatigue.   Muscle aches.   Loss of appetite.   Headache.   Low-grade fever.  DIAGNOSIS  You might diagnose your own cold based on familiar symptoms, since most people get a cold 2 to 3 times a year. Your caregiver can confirm this based on your exam. Most importantly, your  caregiver can check that your symptoms are not due to another disease such as strep throat, sinusitis, pneumonia, asthma, or epiglottitis. Blood tests, throat tests, and X-rays are not necessary to diagnose a common cold, but they may sometimes be helpful in excluding other more serious diseases. Your caregiver will decide if any further tests are required. RISKS AND COMPLICATIONS  You may be at risk for a more severe case of the common cold if you smoke cigarettes, have chronic heart disease (such as heart failure) or lung disease (such as asthma), or if you have a weakened immune system. The very young and very old are also at risk  for more serious infections. Bacterial sinusitis, middle ear infections, and bacterial pneumonia can complicate the common cold. The common cold can worsen asthma and chronic obstructive pulmonary disease (COPD). Sometimes, these complications can require emergency medical care and may be life-threatening. PREVENTION  The best way to protect against getting a cold is to practice good hygiene. Avoid oral or hand contact with people with cold symptoms. Wash your hands often if contact occurs. There is no clear evidence that vitamin C, vitamin E, echinacea, or exercise reduces the chance of developing a cold. However, it is always recommended to get plenty of rest and practice good nutrition. TREATMENT  Treatment is directed at relieving symptoms. There is no cure. Antibiotics are not effective, because the infection is caused by a virus, not by bacteria. Treatment may include:  Increased fluid intake. Sports drinks offer valuable electrolytes, sugars, and fluids.   Breathing heated mist or steam (vaporizer or shower).   Eating chicken soup or other clear broths, and maintaining good nutrition.   Getting plenty of rest.   Using gargles or lozenges for comfort.   Controlling fevers with ibuprofen or acetaminophen as directed by your caregiver.   Increasing usage of your inhaler if you have asthma.  Zinc gel and zinc lozenges, taken in the first 24 hours of the common cold, can shorten the duration and lessen the severity of symptoms. Pain medicines may help with fever, muscle aches, and throat pain. A variety of non-prescription medicines are available to treat congestion and runny nose. Your caregiver can make recommendations and may suggest nasal or lung inhalers for other symptoms.  HOME CARE INSTRUCTIONS   Only take over-the-counter or prescription medicines for pain, discomfort, or fever as directed by your caregiver.   Use a warm mist humidifier or inhale steam from a shower to increase air  moisture. This may keep secretions moist and make it easier to breathe.   Drink enough water and fluids to keep your urine clear or pale yellow.   Rest as needed.   Return to work when your temperature has returned to normal or as your caregiver advises. You may need to stay home longer to avoid infecting others. You can also use a face mask and careful hand washing to prevent spread of the virus.  SEEK MEDICAL CARE IF:   After the first few days, you feel you are getting worse rather than better.   You need your caregiver's advice about medicines to control symptoms.   You develop chills, worsening shortness of breath, or brown or red sputum. These may be signs of pneumonia.   You develop yellow or brown nasal discharge or pain in the face, especially when you bend forward. These may be signs of sinusitis.   You develop a fever, swollen neck glands, pain with swallowing, or white areas in the back  of your throat. These may be signs of strep throat.  SEEK IMMEDIATE MEDICAL CARE IF:   You have a fever.   You develop severe or persistent headache, ear pain, sinus pain, or chest pain.   You develop wheezing, a prolonged cough, cough up blood, or have a change in your usual mucus (if you have chronic lung disease).   You develop sore muscles or a stiff neck.  Document Released: 09/17/2000 Document Revised: 03/13/2011 Document Reviewed: 07/26/2010 [facility] Patient Information 2012 [place], [place].Upper Respiratory Infection, Adult An upper respiratory infection (URI) is also sometimes known as the common cold. The upper respiratory tract includes the nose, sinuses, throat, trachea, and bronchi. Bronchi are the airways leading to the lungs. Most people improve within 1 week, but symptoms can last up to 2 weeks. A residual cough may last even longer.  CAUSES Many different viruses can infect the tissues lining the upper respiratory tract. The tissues become irritated and inflamed and often  become very moist. Mucus production is also common. A cold is contagious. You can easily spread the virus to others by oral contact. This includes kissing, sharing a glass, coughing, or sneezing. Touching your mouth or nose and then touching a surface, which is then touched by another person, can also spread the virus. SYMPTOMS  Symptoms typically develop 1 to 3 days after you come in contact with a cold virus. Symptoms vary from person to person. They may include:  Runny nose.   Sneezing.   Nasal congestion.   Sinus irritation.   Sore throat.   Loss of voice (laryngitis).   Cough.   Fatigue.   Muscle aches.   Loss of appetite.   Headache.   Low-grade fever.  DIAGNOSIS  You might diagnose your own cold based on familiar symptoms, since most people get a cold 2 to 3 times a year. Your caregiver can confirm this based on your exam. Most importantly, your caregiver can check that your symptoms are not due to another disease such as strep throat, sinusitis, pneumonia, asthma, or epiglottitis. Blood tests, throat tests, and X-rays are not necessary to diagnose a common cold, but they may sometimes be helpful in excluding other more serious diseases. Your caregiver will decide if any further tests are required. RISKS AND COMPLICATIONS  You may be at risk for a more severe case of the common cold if you smoke cigarettes, have chronic heart disease (such as heart failure) or lung disease (such as asthma), or if you have a weakened immune system. The very young and very old are also at risk for more serious infections. Bacterial sinusitis, middle ear infections, and bacterial pneumonia can complicate the common cold. The common cold can worsen asthma and chronic obstructive pulmonary disease (COPD). Sometimes, these complications can require emergency medical care and may be life-threatening. PREVENTION  The best way to protect against getting a cold is to practice good hygiene. Avoid oral or  hand contact with people with cold symptoms. Wash your hands often if contact occurs. There is no clear evidence that vitamin C, vitamin E, echinacea, or exercise reduces the chance of developing a cold. However, it is always recommended to get plenty of rest and practice good nutrition. TREATMENT  Treatment is directed at relieving symptoms. There is no cure. Antibiotics are not effective, because the infection is caused by a virus, not by bacteria. Treatment may include:  Increased fluid intake. Sports drinks offer valuable electrolytes, sugars, and fluids.   Breathing heated mist or  steam (vaporizer or shower).   Eating chicken soup or other clear broths, and maintaining good nutrition.   Getting plenty of rest.   Using gargles or lozenges for comfort.   Controlling fevers with ibuprofen or acetaminophen as directed by your caregiver.   Increasing usage of your inhaler if you have asthma.  Zinc gel and zinc lozenges, taken in the first 24 hours of the common cold, can shorten the duration and lessen the severity of symptoms. Pain medicines may help with fever, muscle aches, and throat pain. A variety of non-prescription medicines are available to treat congestion and runny nose. Your caregiver can make recommendations and may suggest nasal or lung inhalers for other symptoms.  HOME CARE INSTRUCTIONS   Only take over-the-counter or prescription medicines for pain, discomfort, or fever as directed by your caregiver.   Use a warm mist humidifier or inhale steam from a shower to increase air moisture. This may keep secretions moist and make it easier to breathe.   Drink enough water and fluids to keep your urine clear or pale yellow.   Rest as needed.   Return to work when your temperature has returned to normal or as your caregiver advises. You may need to stay home longer to avoid infecting others. You can also use a face mask and careful hand washing to prevent spread of the virus.    SEEK MEDICAL CARE IF:   After the first few days, you feel you are getting worse rather than better.   You need your caregiver's advice about medicines to control symptoms.   You develop chills, worsening shortness of breath, or brown or red sputum. These may be signs of pneumonia.   You develop yellow or brown nasal discharge or pain in the face, especially when you bend forward. These may be signs of sinusitis.   You develop a fever, swollen neck glands, pain with swallowing, or white areas in the back of your throat. These may be signs of strep throat.  SEEK IMMEDIATE MEDICAL CARE IF:   You have a fever.   You develop severe or persistent headache, ear pain, sinus pain, or chest pain.   You develop wheezing, a prolonged cough, cough up blood, or have a change in your usual mucus (if you have chronic lung disease).   You develop sore muscles or a stiff neck.  Document Released: 09/17/2000 Document Revised: 03/13/2011 Document Reviewed: 07/26/2010 [facility] Patient Information 2012 [place], [place].

## 2011-07-15 ENCOUNTER — Telehealth: Payer: Self-pay | Admitting: Obstetrics and Gynecology

## 2011-07-16 ENCOUNTER — Ambulatory Visit (INDEPENDENT_AMBULATORY_CARE_PROVIDER_SITE_OTHER): Payer: Medicaid Other | Admitting: Registered Nurse

## 2011-07-16 ENCOUNTER — Encounter: Payer: Self-pay | Admitting: Registered Nurse

## 2011-07-16 VITALS — BP 110/80 | Temp 98.4°F | Wt 136.0 lb

## 2011-07-16 DIAGNOSIS — R112 Nausea with vomiting, unspecified: Secondary | ICD-10-CM

## 2011-07-16 DIAGNOSIS — R197 Diarrhea, unspecified: Secondary | ICD-10-CM | POA: Insufficient documentation

## 2011-07-16 NOTE — Progress Notes (Signed)
Abdomen soft. Non distended. [redacted]w[redacted]d IUD G2P1 with nausea, vomiting, and an episode of diarrhea. Uterus measures approximately 16w. Decreased appetite.   P: Has [place] yet to pick up at Pharmacy per patient. Advised bland foods, info given. Advised increasing H2O intake to 8-10 glassses daily. Also advised small frequent meals. Rev'd signs and sx to report. Has NOB interview 07/28/2011.

## 2011-07-17 NOTE — Telephone Encounter (Signed)
Spoke with pt

## 2011-07-28 ENCOUNTER — Ambulatory Visit (INDEPENDENT_AMBULATORY_CARE_PROVIDER_SITE_OTHER): Payer: Medicaid Other | Admitting: Obstetrics and Gynecology

## 2011-07-28 DIAGNOSIS — Z331 Pregnant state, incidental: Secondary | ICD-10-CM

## 2011-07-28 DIAGNOSIS — M419 Scoliosis, unspecified: Secondary | ICD-10-CM | POA: Insufficient documentation

## 2011-07-28 LAB — POCT URINALYSIS DIPSTICK
Bilirubin, UA: NEGATIVE
Blood, UA: NEGATIVE
Ketones, UA: 1
Leukocytes, UA: NEGATIVE
Protein, UA: NEGATIVE
Spec Grav, UA: 1.025
pH, UA: 5

## 2011-07-29 ENCOUNTER — Ambulatory Visit (INDEPENDENT_AMBULATORY_CARE_PROVIDER_SITE_OTHER): Payer: Medicaid Other | Admitting: Obstetrics and Gynecology

## 2011-07-29 ENCOUNTER — Encounter: Payer: Self-pay | Admitting: Obstetrics and Gynecology

## 2011-07-29 VITALS — BP 110/70 | Ht 60.0 in | Wt 137.0 lb

## 2011-07-29 DIAGNOSIS — Z9109 Other allergy status, other than to drugs and biological substances: Secondary | ICD-10-CM | POA: Insufficient documentation

## 2011-07-29 DIAGNOSIS — J301 Allergic rhinitis due to pollen: Secondary | ICD-10-CM

## 2011-07-29 DIAGNOSIS — Z331 Pregnant state, incidental: Secondary | ICD-10-CM | POA: Insufficient documentation

## 2011-07-29 LAB — PRENATAL PANEL VII
Antibody Screen: NEGATIVE
Eosinophils Absolute: 0.1 10*3/uL (ref 0.0–0.7)
Eosinophils Relative: 1 % (ref 0–5)
HCT: 34.4 % — ABNORMAL LOW (ref 36.0–46.0)
Lymphocytes Relative: 14 % (ref 12–46)
Lymphs Abs: 1.2 10*3/uL (ref 0.7–4.0)
MCH: 31 pg (ref 26.0–34.0)
MCV: 84.7 fL (ref 78.0–100.0)
Monocytes Absolute: 1.2 10*3/uL — ABNORMAL HIGH (ref 0.1–1.0)
Monocytes Relative: 13 % — ABNORMAL HIGH (ref 3–12)
Platelets: 215 10*3/uL (ref 150–400)
RBC: 4.06 MIL/uL (ref 3.87–5.11)
Rh Type: POSITIVE
Rubella: 11.2 IU/mL — ABNORMAL HIGH
WBC: 9 10*3/uL (ref 4.0–10.5)

## 2011-07-29 NOTE — Progress Notes (Signed)
   Subjective:    Allison Mathews is a [id number] [redacted]w[redacted]d being seen today for her first obstetrical visit.  Her obstetrical history is significant for scoliosis, and late to care.. Patient does intend to breast feed. Pregnancy history fully reviewed.  Patient reports pressure.Ceasar Mons Vitals:   07/29/11 1521  BP: 110/70  Weight: 137 lb (62.143 kg)    HISTORY: OB History    Grav Para Term Preterm Abortions TAB SAB Ect Mult Living   3 1 1  1  1   1      # Outc Date GA Lbr Len/2nd Wgt Sex Del Anes PTL Lv   1 TRM 5/10 [redacted]w[redacted]d 12:00 6lb9oz(2.977kg) M VAC EPI No Yes   Comments: NO COMPLICATIONS, GBS POS   2 SAB            3 CUR              Past Medical History  Diagnosis Date  . Heart murmur   . Sickle cell trait   . Anemia     FeSO4 taken  . Scoliosis   . Yeast infection     NOT FREQUENT   Past Surgical History  Procedure Date  . No past surgeries    Family History  Problem Relation Age of Onset  . Anesthesia problems Neg Hx   . Hypotension Neg Hx   . Malignant hyperthermia Neg Hx   . Pseudochol deficiency Neg Hx   . Diabetes Father   . Hypertension Paternal Aunt   . Diabetes Paternal Grandmother      Exam    Uterus:  Fundal Height: 18 cm  Pelvic Exam:    Perineum: No Hemorrhoids   Vulva: normal   Vagina:  normal mucosa   pH: 4.5   Cervix: no lesions   Adnexa: normal adnexa   Bony Pelvis: gynecoid  System: Breast:  normal appearance, no masses or tenderness   Skin: normal coloration and turgor, no rashes    Neurologic: normal   Extremities: normal strength, tone, and muscle mass   HEENT PERRLA and thyroid without masses   Mouth/Teeth mucous membranes moist, pharynx normal without lesions   Neck no masses   Cardiovascular: regular rate and rhythm, 2/6 SEM   Respiratory:  appears well, vitals normal, no respiratory distress, acyanotic, normal RR, ear and throat exam is normal   Abdomen: Gravid, FHT 156.   Urinary: urethral meatus normal   Wet Prep:  neg   Assessment:    Pregnancy: G3P1011 Patient Active Problem List  Diagnoses  . Nausea & vomiting  . Diarrhea  . Scoliosis  . Pregnant state, incidental  . Pollen allergies  Pt does not have sickle trait.      Plan:    GC, Chl sent. Initial labs drawn. Prenatal vitamins. Problem list reviewed and updated. Genetic Screening discussed Quad Screen: ordered.  Ultrasound discussed; fetal survey: ordered.  Follow up in 2 weeks. 60% of 50 min visit spent on counseling and coordination of care.  Fish, cats, soft cheese discussed.  Ob procedures reviewed.  Janine Limbo 07/29/2011

## 2011-07-29 NOTE — Progress Notes (Signed)
Pt was having Increased amount of pain "cramping"  and pressure she was informed by a nurse yesterday of things to do to help with the pain. Pt declines Flu shot  Pt wants Quad screen. Pt states no other complaints. Pt brought recorded blood sugars to today's visit a copy was made. I did not see blood sugars (AVS).

## 2011-07-30 LAB — GC/CHLAMYDIA PROBE AMP, GENITAL: GC Probe Amp, Genital: NEGATIVE

## 2011-07-30 LAB — AFP, QUAD SCREEN
AFP: 36.1 IU/mL
Curr Gest Age: 17.1 wks.days
INH: 242.6 pg/mL
Interpretation-AFP: NEGATIVE
MoM for INH: 1.3
Osb Risk: 1:36400 {titer}
Trisomy 18 (Edward) Syndrome Interp.: 1:28300 {titer}
uE3 Value: 0.6 ng/mL

## 2011-07-30 LAB — CULTURE, OB URINE

## 2011-07-30 LAB — HEMOGLOBINOPATHY EVALUATION
Hemoglobin Other: 0 %
Hgb F Quant: 0.2 % (ref 0.0–2.0)
Hgb S Quant: 0 %

## 2011-07-30 NOTE — Progress Notes (Signed)
Quick Note:  Please pull patient's chart and put on my desk. Mark chart "DO NOT REMOVE FROM DR Amarii Bordas'S DESK". Send me confirmation that the chart is on my desk. Thank you. ______ 

## 2011-07-31 ENCOUNTER — Telehealth: Payer: Self-pay

## 2011-08-04 ENCOUNTER — Telehealth: Payer: Self-pay

## 2011-08-04 LAB — HGB ELECTROPHORESIS REFLEXED REPORT
Hemoglobin A2 - HGBRFX: 3.3 % (ref 1.8–3.5)
Hemoglobin Elect C: 41.5 % — ABNORMAL HIGH
Hemoglobin F - HGBRFX: 1.6 % (ref <2.0)

## 2011-08-04 NOTE — Telephone Encounter (Signed)
LMTC  ld 

## 2011-08-04 NOTE — Telephone Encounter (Signed)
LMTC @ 11:00  ld 

## 2011-08-04 NOTE — Telephone Encounter (Signed)
Message copied by Larwance Rote on Mon Aug 04, 2011  5:00 PM ------      Message from: Silverio Lay      Created: Wed Jul 30, 2011  3:40 PM       Please call in prescription for Septra DS BID for 7 days  #14 / 0 RF      Will need TOC

## 2011-08-05 NOTE — Telephone Encounter (Signed)
LMTC @5 ld 

## 2011-08-12 NOTE — Telephone Encounter (Signed)
appt tom./given to chk in

## 2011-08-13 ENCOUNTER — Ambulatory Visit (INDEPENDENT_AMBULATORY_CARE_PROVIDER_SITE_OTHER): Payer: Medicaid Other

## 2011-08-13 ENCOUNTER — Ambulatory Visit: Payer: Medicaid Other

## 2011-08-13 VITALS — BP 110/70 | Wt 140.0 lb

## 2011-08-13 DIAGNOSIS — O2390 Unspecified genitourinary tract infection in pregnancy, unspecified trimester: Secondary | ICD-10-CM

## 2011-08-13 DIAGNOSIS — O2342 Unspecified infection of urinary tract in pregnancy, second trimester: Secondary | ICD-10-CM

## 2011-08-13 DIAGNOSIS — N390 Urinary tract infection, site not specified: Secondary | ICD-10-CM | POA: Insufficient documentation

## 2011-08-13 DIAGNOSIS — Z331 Pregnant state, incidental: Secondary | ICD-10-CM

## 2011-08-13 MED ORDER — SULFAMETHOXAZOLE-TRIMETHOPRIM 800-160 MG PO TABS: 1.0000 | ORAL_TABLET | Freq: Two times a day (BID) | ORAL | Status: AC

## 2011-08-13 NOTE — Progress Notes (Signed)
Pt is not diabetic, so unsure r/e CMA's note r/e CBG's.  Rev'd PNL's and positive urine cx.  Per c/w dr. Su Hilt, will treat w/ Bactrim b/c resistant to Keflex and intermediate for Macrobid.  N/v resolved.  Husband had to leave and wanted to reschedule anatomy when he is present--scheduled for 5/10.  Son w/ her at appt.  No other c/o's except about wait time to be seen.

## 2011-08-13 NOTE — Progress Notes (Signed)
CBG's not done per pt Anatomy r/s'd for 5/10 @10am  U/S ran behind due to an emergency work in, husband had to leave & pt doesn't want u/s done without husband Pt will stay for visit today

## 2011-08-15 ENCOUNTER — Ambulatory Visit (INDEPENDENT_AMBULATORY_CARE_PROVIDER_SITE_OTHER): Payer: Medicaid Other

## 2011-08-15 DIAGNOSIS — Z331 Pregnant state, incidental: Secondary | ICD-10-CM

## 2011-08-15 LAB — US OB COMP + 14 WK

## 2011-09-08 ENCOUNTER — Encounter: Payer: Self-pay | Admitting: Obstetrics and Gynecology

## 2011-09-10 ENCOUNTER — Ambulatory Visit (INDEPENDENT_AMBULATORY_CARE_PROVIDER_SITE_OTHER): Payer: Medicaid Other | Admitting: Obstetrics and Gynecology

## 2011-09-10 ENCOUNTER — Encounter: Payer: Self-pay | Admitting: Obstetrics and Gynecology

## 2011-09-10 VITALS — BP 110/60 | Ht 60.0 in | Wt 143.0 lb

## 2011-09-10 DIAGNOSIS — N390 Urinary tract infection, site not specified: Secondary | ICD-10-CM

## 2011-09-10 DIAGNOSIS — Z331 Pregnant state, incidental: Secondary | ICD-10-CM

## 2011-09-10 LAB — POCT WET PREP (WET MOUNT)
Clue Cells Wet Prep Whiff POC: NEGATIVE
Trichomonas Wet Prep HPF POC: NEGATIVE
pH: 4.5

## 2011-09-10 MED ORDER — TERCONAZOLE 0.4 % VA CREA: TOPICAL_CREAM | VAGINAL | Status: DC

## 2011-09-10 NOTE — Progress Notes (Signed)
C/o possible yeast infection. States that vaginal area is very irritated. "feels swollen". Test of cure urine culture done today Results for orders placed in visit on 09/10/11  POCT WET PREP (WET MOUNT)      Component Value Range   Source Wet Prep POC       WBC, Wet Prep HPF POC       Bacteria Wet Prep HPF POC       BACTERIA WET PREP MORPHOLOGY POC       Clue Cells Wet Prep HPF POC None     CLUE CELLS WET PREP WHIFF POC Negative Whiff     Yeast Wet Prep HPF POC Many     KOH Wet Prep POC       Trichomonas Wet Prep HPF POC neg     pH 4.5     Impression: Yeast vaginitis Recommendation: Terazol 7

## 2011-09-11 LAB — URINE CULTURE
Colony Count: NO GROWTH
Organism ID, Bacteria: NO GROWTH

## 2011-10-06 ENCOUNTER — Other Ambulatory Visit: Payer: Medicaid Other

## 2011-10-06 ENCOUNTER — Encounter: Payer: Self-pay | Admitting: Obstetrics and Gynecology

## 2011-10-06 ENCOUNTER — Ambulatory Visit (INDEPENDENT_AMBULATORY_CARE_PROVIDER_SITE_OTHER): Payer: Medicaid Other | Admitting: Obstetrics and Gynecology

## 2011-10-06 VITALS — BP 110/58 | Wt 142.0 lb

## 2011-10-06 DIAGNOSIS — Z331 Pregnant state, incidental: Secondary | ICD-10-CM

## 2011-10-06 NOTE — Progress Notes (Signed)
Pt had honey smacks cereal @ 10 this morning for breakfast Blood glucose- 110 Glucose drink not given C/O of sore throat x 2 days & earache x 1 month Pt's father just passed and has been very stressed

## 2011-10-06 NOTE — Progress Notes (Signed)
Glucosuria noted.  Glucola next visit. Return office in 3 weeks. Throat clear.  Nose congested.  History of pollen allergy.   Try Sudafed or Claritin. Dr. Stefano Gaul

## 2011-10-07 ENCOUNTER — Encounter (HOSPITAL_COMMUNITY): Payer: Self-pay | Admitting: *Deleted

## 2011-10-07 ENCOUNTER — Inpatient Hospital Stay (HOSPITAL_COMMUNITY)
Admission: AD | Admit: 2011-10-07 | Discharge: 2011-10-07 | Disposition: A | Discharge status: Home / Self Care | Payer: Medicaid Other | Source: Ambulatory Visit | Attending: Obstetrics and Gynecology | Admitting: Obstetrics and Gynecology

## 2011-10-07 ENCOUNTER — Other Ambulatory Visit: Payer: Self-pay | Admitting: Obstetrics and Gynecology

## 2011-10-07 ENCOUNTER — Telehealth: Payer: Self-pay | Admitting: Obstetrics and Gynecology

## 2011-10-07 DIAGNOSIS — O4700 False labor before 37 completed weeks of gestation, unspecified trimester: Secondary | ICD-10-CM

## 2011-10-07 DIAGNOSIS — O479 False labor, unspecified: Secondary | ICD-10-CM

## 2011-10-07 DIAGNOSIS — N390 Urinary tract infection, site not specified: Secondary | ICD-10-CM

## 2011-10-07 LAB — WET PREP, GENITAL
Trich, Wet Prep: NONE SEEN
Yeast Wet Prep HPF POC: NONE SEEN

## 2011-10-07 LAB — URINALYSIS, ROUTINE W REFLEX MICROSCOPIC
Bilirubin Urine: NEGATIVE
Glucose, UA: NEGATIVE mg/dL
Hgb urine dipstick: NEGATIVE
Ketones, ur: >80 mg/dL — AB
Protein, ur: NEGATIVE mg/dL

## 2011-10-07 MED ORDER — NIFEDIPINE 10 MG PO CAPS: 10.0000 mg | ORAL_CAPSULE | Freq: Four times a day (QID) | ORAL | Status: DC | PRN

## 2011-10-07 MED ORDER — PRENATAL MULTIVITAMIN CH: 1.0000 | ORAL_TABLET | Freq: Every day | ORAL | Status: DC

## 2011-10-07 MED ORDER — BETAMETHASONE SOD PHOS & ACET 6 (3-3) MG/ML IJ SUSP
12.0000 mg | Freq: Once | INTRAMUSCULAR | Status: AC
  Administered 2011-10-07: 12 mg via INTRAMUSCULAR
  Filled 2011-10-07: qty 2

## 2011-10-07 NOTE — Telephone Encounter (Signed)
TRIAGE

## 2011-10-07 NOTE — Progress Notes (Signed)
History   g3p1 presents with c/o of uc while at work, feeling few contractions now that I am here, denies srom, vag bleeding, with +FM, no hx of PTL. Pregnancy hx Late prenatal care  Chief Complaint  Patient presents with  . Preterm contractions    @SFHPI @  OB History    Grav Para Term Preterm Abortions TAB SAB Ect Mult Living   3 1 1  1  1   1       Past Medical History  Diagnosis Date  . Heart murmur   . Sickle cell trait   . Anemia     FeSO4 taken  . Scoliosis   . Yeast infection     NOT FREQUENT    Past Surgical History  Procedure Date  . No past surgeries     Family History  Problem Relation Age of Onset  . Anesthesia problems Neg Hx   . Hypotension Neg Hx   . Malignant hyperthermia Neg Hx   . Pseudochol deficiency Neg Hx   . Diabetes Father   . Hypertension Paternal Aunt   . Diabetes Paternal Grandmother     History  Substance Use Topics  . Smoking status: Never Smoker   . Smokeless tobacco: Never Used  . Alcohol Use: No    Allergies: No Known Allergies  Prescriptions prior to admission  Medication Sig Dispense Refill  . Prenatal Vit-Fe Fumarate-FA (PRENATAL MULTIVITAMIN) TABS Take 1 tablet by mouth daily.      Marland Kitchen loratadine (CLARITIN) 10 MG tablet Take 10 mg by mouth as needed.        @ROS @ Physical Exam  Calm, no distress, HEENT WNL exception nasal turbinates edematous no exudate, lungs clear bilaterally, AP RRR, abd soft nt,no masses, not tympanic bowel sounds active, abdomen nontender, Normal hair distrubition mons pubis,  EGBUS WNL,  sterile speculum exam  vagina pink, moist normal rugae,  normal appearing cervix without discharge or lesions, cervical discharge present - white No adnexal masses or tenderness thick none digital exam LTC  edema to lower extremities Pending: GC/CHL  WET PREP  FFN GBS A 27 17 week IUP    Preterm contractions  Blood pressure 112/55, pulse 84, temperature 98.7 F (37.1 C), temperature source Oral,  resp. rate 16, height 4' 11.5" (1.511 m), weight 143 lb 6 oz (65.034 kg), last menstrual period 03/31/2011. P encouraged po fluids, frequent voids, Lavera Guise, CNM

## 2011-10-07 NOTE — MAU Note (Signed)
Pt states contractions started around at 1900 yesterday, continues today, Pt states ctx's seem to be slowing down some. Denies bleeding or vag d/c changes. Also lost voice partially yesterday.

## 2011-10-07 NOTE — Telephone Encounter (Signed)
TC from pt. States is having cramping,back pain,sharp abd pain and contractions every few minutes since last PM.  No improvement with increase in water.  Decreased fetal movement. No vag leakage.  Pt also has laryngitis.  Per VL pt to MAU.  MK notified.

## 2011-10-07 NOTE — Progress Notes (Signed)
Results for orders placed during the hospital encounter of 10/07/11 (from the past 24 hour(s))  URINALYSIS, ROUTINE W REFLEX MICROSCOPIC     Status: Abnormal   Collection Time   10/07/11 10:05 AM      Component Value Range   Color, Urine YELLOW  YELLOW   APPearance CLEAR  CLEAR   Specific Gravity, Urine 1.015  1.005 - 1.030   pH 6.0  5.0 - 8.0   Glucose, UA NEGATIVE  NEGATIVE mg/dL   Hgb urine dipstick NEGATIVE  NEGATIVE   Bilirubin Urine NEGATIVE  NEGATIVE   Ketones, ur >80 (*) NEGATIVE mg/dL   Protein, ur NEGATIVE  NEGATIVE mg/dL   Urobilinogen, UA 0.2  0.0 - 1.0 mg/dL   Nitrite NEGATIVE  NEGATIVE   Leukocytes, UA NEGATIVE  NEGATIVE  FETAL FIBRONECTIN     Status: Abnormal   Collection Time   10/07/11 10:45 AM      Component Value Range   Fetal Fibronectin POSITIVE (*) NEGATIVE  WET PREP, GENITAL     Status: Abnormal   Collection Time   10/07/11 10:45 AM      Component Value Range   Yeast Wet Prep HPF POC NONE SEEN  NONE SEEN   Trich, Wet Prep NONE SEEN  NONE SEEN   Clue Cells Wet Prep HPF POC NONE SEEN  NONE SEEN   WBC, Wet Prep HPF POC MANY (*) NONE SEEN  A +FFN risk for PTL     uc resolved P RX procardia prn discussed, ketonuria increase calories, f/o 2nd dose BMZ in 24 hours. Reviewed s/s preterm labor, srom, vag bleeding,daily kick counts to report, encouraged 8 water daily and frequent voids. F/o office 1 week, out of work until office f/o limit activity, collaboration with Dr. Stefano Gaul per telephone with assessment as documented. Discharge home. Lavera Guise, CNM

## 2011-10-08 ENCOUNTER — Inpatient Hospital Stay (HOSPITAL_COMMUNITY)
Admission: AD | Admit: 2011-10-08 | Discharge: 2011-10-08 | Disposition: A | Discharge status: Home / Self Care | Payer: Medicaid Other | Source: Ambulatory Visit | Attending: Obstetrics and Gynecology | Admitting: Obstetrics and Gynecology

## 2011-10-08 DIAGNOSIS — O4700 False labor before 37 completed weeks of gestation, unspecified trimester: Secondary | ICD-10-CM | POA: Insufficient documentation

## 2011-10-08 LAB — GC/CHLAMYDIA PROBE AMP, GENITAL: Chlamydia, DNA Probe: NEGATIVE

## 2011-10-08 MED ORDER — BETAMETHASONE SOD PHOS & ACET 6 (3-3) MG/ML IJ SUSP
12.0000 mg | Freq: Once | INTRAMUSCULAR | Status: AC
  Administered 2011-10-08: 12 mg via INTRAMUSCULAR
  Filled 2011-10-08: qty 2

## 2011-10-09 LAB — URINE CULTURE
Colony Count: NO GROWTH
Culture: NO GROWTH

## 2011-10-10 LAB — CULTURE, BETA STREP (GROUP B ONLY)

## 2011-10-11 ENCOUNTER — Encounter (HOSPITAL_COMMUNITY): Payer: Self-pay | Admitting: *Deleted

## 2011-10-11 ENCOUNTER — Inpatient Hospital Stay (HOSPITAL_COMMUNITY)
Admission: AD | Admit: 2011-10-11 | Discharge: 2011-10-11 | Disposition: A | Discharge status: Home / Self Care | Payer: Medicaid Other | Source: Ambulatory Visit | Attending: Obstetrics and Gynecology | Admitting: Obstetrics and Gynecology

## 2011-10-11 ENCOUNTER — Telehealth: Payer: Self-pay | Admitting: Obstetrics and Gynecology

## 2011-10-11 DIAGNOSIS — O4700 False labor before 37 completed weeks of gestation, unspecified trimester: Secondary | ICD-10-CM | POA: Insufficient documentation

## 2011-10-11 DIAGNOSIS — O99891 Other specified diseases and conditions complicating pregnancy: Secondary | ICD-10-CM | POA: Insufficient documentation

## 2011-10-11 DIAGNOSIS — J329 Chronic sinusitis, unspecified: Secondary | ICD-10-CM | POA: Insufficient documentation

## 2011-10-11 DIAGNOSIS — R05 Cough: Secondary | ICD-10-CM | POA: Insufficient documentation

## 2011-10-11 DIAGNOSIS — Z331 Pregnant state, incidental: Secondary | ICD-10-CM

## 2011-10-11 DIAGNOSIS — R059 Cough, unspecified: Secondary | ICD-10-CM | POA: Insufficient documentation

## 2011-10-11 DIAGNOSIS — J069 Acute upper respiratory infection, unspecified: Secondary | ICD-10-CM | POA: Insufficient documentation

## 2011-10-11 MED ORDER — AZITHROMYCIN 250 MG PO TABS: ORAL_TABLET | ORAL | Status: AC

## 2011-10-11 NOTE — MAU Provider Note (Signed)
History     CSN: [id number]  Arrival date and time: 10/11/11 1509   First Provider Initiated Contact with Patient 10/11/11 1559      Chief Complaint  Patient presents with  . Nasal Congestion  . Contractions  . Cough   HPI Comments: Pt is a G3P1 at [redacted]w[redacted]d that presents w c/o cough and congestion w green phlegm. She states she's has "cold symptoms" for about a week, but cough and phlegm started yesterday. She denies any fever, no sob, no wheezing. She denies any pain, ctx, VB or LOF, GFM.    Cough Pertinent negatives include no chills, ear pain, fever, sore throat, shortness of breath or wheezing.      Past Medical History  Diagnosis Date  . Heart murmur   . Sickle cell trait   . Anemia     FeSO4 taken  . Scoliosis   . Yeast infection     NOT FREQUENT    Past Surgical History  Procedure Date  . No past surgeries     Family History  Problem Relation Age of Onset  . Anesthesia problems Neg Hx   . Hypotension Neg Hx   . Malignant hyperthermia Neg Hx   . Pseudochol deficiency Neg Hx   . Diabetes Father   . Hypertension Paternal Aunt   . Diabetes Paternal Grandmother     History  Substance Use Topics  . Smoking status: Never Smoker   . Smokeless tobacco: Never Used  . Alcohol Use: No    Allergies: No Known Allergies  No prescriptions prior to admission    Review of Systems  Constitutional: Negative for fever, chills and malaise/fatigue.  HENT: Positive for congestion. Negative for ear pain, sore throat and ear discharge.   Respiratory: Positive for cough and sputum production. Negative for shortness of breath and wheezing.   All other systems reviewed and are negative.   Physical Exam   Blood pressure 110/64, pulse 85, temperature 97.3 F (36.3 C), temperature source Oral, resp. rate 20, height 5' (1.524 m), weight 142 lb 3.2 oz (64.501 kg), last menstrual period 03/31/2011.  Physical Exam  Nursing note and vitals reviewed. Constitutional: She is  oriented to person, place, and time. She appears well-developed and well-nourished. No distress.  HENT:  Head: Normocephalic.  Nose: Right sinus exhibits maxillary sinus tenderness. Left sinus exhibits maxillary sinus tenderness.  Mouth/Throat: Mucous membranes are normal. Posterior oropharyngeal erythema present.       Inflamed nasal turbinates  Neck: Normal range of motion.  Cardiovascular: Normal rate, regular rhythm and normal heart sounds.   Respiratory: Effort normal and breath sounds normal.  GI: Soft. Bowel sounds are normal.       AGA Visible fetal movements Uterus NT,   Genitourinary:       Deferred   Musculoskeletal: Normal range of motion. She exhibits no edema.  Lymphadenopathy:       Head (right side): Submandibular adenopathy present.       Head (left side): Submandibular adenopathy present.  Neurological: She is alert and oriented to person, place, and time.  Skin: Skin is warm and dry.  Psychiatric: She has a normal mood and affect. Her behavior is normal.   FHR 150 reactive for GA toco quiet   MAU Course  Procedures    Assessment and Plan  IUP at [redacted]w[redacted]d URI vs. Sinus infection/inflammation  D/c home rx: zpack rec sudafed/mucinex Inc PO hydration Pt has appt Monday Call if fever/aches/chills/SOB  Sarah Zerby M 10/11/2011, 8:12 PM

## 2011-10-11 NOTE — Telephone Encounter (Signed)
TC from pt c/o abd pain ?ctx, denies any VB or LOF, GFM, has rx for procardia and states she took it about 30 min ago, instructed to come to MAU.

## 2011-10-11 NOTE — MAU Note (Signed)
Spitting up green mucus since yesterday has sinus infection (from MAU) on Tuesday, irregular contractions , taking something for PTL

## 2011-10-11 NOTE — MAU Note (Signed)
Almond Lint CNM in with patient

## 2011-10-12 ENCOUNTER — Inpatient Hospital Stay (HOSPITAL_COMMUNITY)
Admission: AD | Admit: 2011-10-12 | Discharge: 2011-10-12 | Disposition: A | Discharge status: Home / Self Care | Payer: Medicaid Other | Source: Ambulatory Visit | Attending: Obstetrics and Gynecology | Admitting: Obstetrics and Gynecology

## 2011-10-12 ENCOUNTER — Encounter (HOSPITAL_COMMUNITY): Payer: Self-pay

## 2011-10-12 DIAGNOSIS — O4700 False labor before 37 completed weeks of gestation, unspecified trimester: Secondary | ICD-10-CM | POA: Insufficient documentation

## 2011-10-12 DIAGNOSIS — R109 Unspecified abdominal pain: Secondary | ICD-10-CM

## 2011-10-12 DIAGNOSIS — IMO0001 Reserved for inherently not codable concepts without codable children: Secondary | ICD-10-CM | POA: Diagnosis present

## 2011-10-12 DIAGNOSIS — K5900 Constipation, unspecified: Secondary | ICD-10-CM | POA: Diagnosis present

## 2011-10-12 DIAGNOSIS — N390 Urinary tract infection, site not specified: Secondary | ICD-10-CM

## 2011-10-12 DIAGNOSIS — Z331 Pregnant state, incidental: Secondary | ICD-10-CM

## 2011-10-12 LAB — WET PREP, GENITAL
Clue Cells Wet Prep HPF POC: NONE SEEN
Trich, Wet Prep: NONE SEEN

## 2011-10-12 LAB — URINALYSIS, ROUTINE W REFLEX MICROSCOPIC
Bilirubin Urine: NEGATIVE
Hgb urine dipstick: NEGATIVE
Ketones, ur: NEGATIVE mg/dL
Nitrite: NEGATIVE
Specific Gravity, Urine: 1.015 (ref 1.005–1.030)
pH: 7 (ref 5.0–8.0)

## 2011-10-12 MED ORDER — PANTOPRAZOLE SODIUM 40 MG PO TBEC
40.0000 mg | DELAYED_RELEASE_TABLET | Freq: Every day | ORAL | Status: DC
  Administered 2011-10-12: 40 mg via ORAL
  Filled 2011-10-12: qty 1

## 2011-10-12 MED ORDER — PANTOPRAZOLE SODIUM 40 MG PO TBEC: 40.0000 mg | DELAYED_RELEASE_TABLET | Freq: Every day | ORAL | Status: DC

## 2011-10-12 MED ORDER — SIMETHICONE 80 MG PO CHEW
80.0000 mg | CHEWABLE_TABLET | Freq: Once | ORAL | Status: AC
  Administered 2011-10-12: 80 mg via ORAL
  Filled 2011-10-12: qty 1

## 2011-10-12 MED ORDER — PROMETHAZINE HCL 25 MG PO TABS
25.0000 mg | ORAL_TABLET | Freq: Once | ORAL | Status: AC
  Administered 2011-10-12: 25 mg via ORAL
  Filled 2011-10-12: qty 1

## 2011-10-12 MED ORDER — PANTOPRAZOLE SODIUM 40 MG PO TBEC
40.0000 mg | DELAYED_RELEASE_TABLET | Freq: Every day | ORAL | Status: DC
  Filled 2011-10-12: qty 1

## 2011-10-12 MED ORDER — NIFEDIPINE 10 MG PO CAPS
10.0000 mg | ORAL_CAPSULE | Freq: Once | ORAL | Status: AC
  Administered 2011-10-12: 10 mg via ORAL
  Filled 2011-10-12: qty 1

## 2011-10-12 NOTE — Progress Notes (Addendum)
Feeling much better now, wants to go home. Thinks her pain was gas--has received Procardia x 1 for mild irritability, Protonix for reflex, simethicone for gas, Phenergan for nausea.  FHR with reactive segments, + FM. Occasional mild irritability.  Will observe till 8:30 to allow for my assessment and evaluation of response to meds. Anticipate sending patient home with instructions to continue Procardia, other measures for digestive issues.  Nigel Bridgeman, CNM, [place] 10/12/11 8:15am

## 2011-10-12 NOTE — MAU Provider Note (Signed)
History     CSN: [id number]  Arrival date and time: 10/12/11 0609   None     Chief Complaint  Patient presents with  . Contractions   HPI Comments: Pt is a 21yo G3P1 at [redacted]w[redacted]d that arrives unannounced c/o ?ctx/abd pain, (points to midepigastric region). Called me at 10pm w c/o "stomach pain" and instructed to come to MAU at that time. Was seen by me earlier in the day on 10-11-11 for c/o congestion and cough, did not have pain at that time. Has been on procardia q6h for +FFN on 7-3, rcvd BMZ x2 at that time. States she last took procardia about midnight. Reports +BM last evening, tho c/o "constipation" States she last ate about 2 hours ago, c/o some nausea.  Denies any VB, or leaking fluid, +FM.       Past Medical History  Diagnosis Date  . Heart murmur   . Sickle cell trait   . Anemia     FeSO4 taken  . Scoliosis   . Yeast infection     NOT FREQUENT    Past Surgical History  Procedure Date  . No past surgeries     Family History  Problem Relation Age of Onset  . Anesthesia problems Neg Hx   . Hypotension Neg Hx   . Malignant hyperthermia Neg Hx   . Pseudochol deficiency Neg Hx   . Diabetes Father   . Hypertension Paternal Aunt   . Diabetes Paternal Grandmother     History  Substance Use Topics  . Smoking status: Never Smoker   . Smokeless tobacco: Never Used  . Alcohol Use: No    Allergies: No Known Allergies  Prescriptions prior to admission  Medication Sig Dispense Refill  . azithromycin (ZITHROMAX Z-PAK) 250 MG tablet Take 2 tablets today, and 1 tablet daily for 4 days, complete all medication even if you feel better  6 each  0  . loratadine (CLARITIN) 10 MG tablet Take 10 mg by mouth as needed.      Marland Kitchen NIFEdipine (PROCARDIA) 10 MG capsule Take 1 capsule (10 mg total) by mouth every 6 (six) hours as needed.  90 capsule  1  . Prenatal Vit-Fe Fumarate-FA (PRENATAL MULTIVITAMIN) TABS Take 1 tablet by mouth daily.  30 tablet  11    Review of Systems    Respiratory: Positive for cough and sputum production.   Gastrointestinal: Positive for nausea, abdominal pain and constipation. Negative for vomiting and diarrhea.  Genitourinary: Positive for frequency. Negative for dysuria.  Musculoskeletal: Positive for back pain.  All other systems reviewed and are negative.   Physical Exam   Blood pressure 117/59, pulse 66, temperature 98.7 F (37.1 C), temperature source Oral, resp. rate 18, height 5' (1.524 m), weight 144 lb 6 oz (65.488 kg), last menstrual period 03/31/2011.  Physical Exam  Nursing note and vitals reviewed. Constitutional: She is oriented to person, place, and time. She appears well-developed and well-nourished.       Pt is grimacing   HENT:  Head: Normocephalic.  Neck: Normal range of motion.  Cardiovascular: Normal rate, regular rhythm and normal heart sounds.   Respiratory: Effort normal and breath sounds normal.  GI: Soft. Bowel sounds are normal. She exhibits no distension. There is no tenderness. There is no rebound and no guarding.       AGA  Genitourinary: Vaginal discharge found.       sm amt white discharge in vault,  cx - cl/th/high   Musculoskeletal: Normal range  of motion. She exhibits no edema.  Neurological: She is alert and oriented to person, place, and time.  Skin: Skin is warm and dry.  Psychiatric: She has a normal mood and affect. Her behavior is normal.   FHR 150 ?variable decel, mod variable, non-reactive at this time toco - irreg ctx,   MAU Course  Procedures    Assessment and Plan  IUP at [redacted]w[redacted]d  Preterm ctx without cervical change ?gas or ?reflux  Wet prep sent UA pending  Will order dose of procardia  Offer simethicone , phenergan and protonix  PO hydrate CTO FHR and ctx pattern   Hatim Homann M 10/12/2011, 7:13 AM

## 2011-10-12 NOTE — MAU Note (Addendum)
Patient is in with c/o constant contractions since 0200am. She denies any vaginal bleeding, lof or discharge. She reports good fetal movement. Patient states that she last took her procardia 10mg  at midnight. (1 tablet q6h prn contractions per patient)

## 2011-10-13 ENCOUNTER — Ambulatory Visit (INDEPENDENT_AMBULATORY_CARE_PROVIDER_SITE_OTHER): Payer: Medicaid Other | Admitting: Obstetrics and Gynecology

## 2011-10-13 DIAGNOSIS — Z331 Pregnant state, incidental: Secondary | ICD-10-CM

## 2011-10-13 DIAGNOSIS — N390 Urinary tract infection, site not specified: Secondary | ICD-10-CM

## 2011-10-13 NOTE — Progress Notes (Signed)
Pt stated no issues today/ Glucola given today @ 4:03 draw at 5:03

## 2011-10-13 NOTE — Progress Notes (Signed)
FFN + 10/07/11: Betamethasone   Currently on bed rest. Using Procardia every 6 hours: still contracting but not as strong, up to 6 in an hour No discharge. No bleeding. No LOF Glucola today Maintain bed rest and OOW. Plan repeat FFN 10/21/11

## 2011-10-13 NOTE — Patient Instructions (Signed)
Out of work note until further notice: preterm labor

## 2011-10-14 LAB — CBC
Hemoglobin: 12.7 g/dL (ref 12.0–15.0)
Platelets: 206 10*3/uL (ref 150–400)
RBC: 4.19 MIL/uL (ref 3.87–5.11)
WBC: 14.3 10*3/uL — ABNORMAL HIGH (ref 4.0–10.5)

## 2011-10-14 LAB — RPR

## 2011-10-16 LAB — GLUCOSE TOLERANCE, 1 HOUR (50G) W/O FASTING: Glucose, 1 Hour GTT: 126 mg/dL (ref 70–140)

## 2011-10-21 ENCOUNTER — Ambulatory Visit (INDEPENDENT_AMBULATORY_CARE_PROVIDER_SITE_OTHER): Payer: Medicaid Other | Admitting: Obstetrics and Gynecology

## 2011-10-21 ENCOUNTER — Encounter: Payer: Self-pay | Admitting: Obstetrics and Gynecology

## 2011-10-21 VITALS — BP 110/52 | Wt 145.0 lb

## 2011-10-21 DIAGNOSIS — O4700 False labor before 37 completed weeks of gestation, unspecified trimester: Secondary | ICD-10-CM

## 2011-10-21 DIAGNOSIS — O479 False labor, unspecified: Secondary | ICD-10-CM

## 2011-10-21 NOTE — Progress Notes (Signed)
A/P Glucola 126, hemoglobin 12.6  and RPR NR  Fetal kick counts reviewed All patients questions answered Return in two weekpt declined cervix exam Continue Prenatal vitamins

## 2011-10-21 NOTE — Progress Notes (Signed)
Pt. Stated no issues today.  

## 2011-10-28 ENCOUNTER — Encounter: Payer: Medicaid Other | Admitting: Obstetrics and Gynecology

## 2011-10-28 ENCOUNTER — Other Ambulatory Visit: Payer: Medicaid Other

## 2011-11-03 ENCOUNTER — Ambulatory Visit (INDEPENDENT_AMBULATORY_CARE_PROVIDER_SITE_OTHER): Payer: Medicaid Other

## 2011-11-03 ENCOUNTER — Other Ambulatory Visit: Payer: Self-pay | Admitting: Obstetrics and Gynecology

## 2011-11-03 ENCOUNTER — Encounter: Payer: Self-pay | Admitting: Obstetrics and Gynecology

## 2011-11-03 ENCOUNTER — Ambulatory Visit (INDEPENDENT_AMBULATORY_CARE_PROVIDER_SITE_OTHER): Payer: Medicaid Other | Admitting: Obstetrics and Gynecology

## 2011-11-03 VITALS — BP 104/60 | Wt 148.0 lb

## 2011-11-03 DIAGNOSIS — Z3490 Encounter for supervision of normal pregnancy, unspecified, unspecified trimester: Secondary | ICD-10-CM

## 2011-11-03 DIAGNOSIS — Z331 Pregnant state, incidental: Secondary | ICD-10-CM

## 2011-11-03 DIAGNOSIS — O4700 False labor before 37 completed weeks of gestation, unspecified trimester: Secondary | ICD-10-CM

## 2011-11-03 DIAGNOSIS — O26879 Cervical shortening, unspecified trimester: Secondary | ICD-10-CM

## 2011-11-03 DIAGNOSIS — O479 False labor, unspecified: Secondary | ICD-10-CM

## 2011-11-03 NOTE — Progress Notes (Signed)
No complaints Ultrasound shows:   SIUP  S=D     EFW=3LB 12OZ       [place] EDD: 01/05/2012            AFI: 17.5            Cervical length: 2.5 cm            Placenta localization: posterior            Fetal presentation: Vertex                   Anatomy survey is normal           Gender : female REC: Continue modified BR and prn procardia till 36 weeks

## 2011-11-04 LAB — US OB FOLLOW UP

## 2011-11-04 LAB — US OB TRANSVAGINAL

## 2011-11-09 ENCOUNTER — Encounter (HOSPITAL_COMMUNITY): Payer: Self-pay | Admitting: Obstetrics and Gynecology

## 2011-11-09 ENCOUNTER — Inpatient Hospital Stay (HOSPITAL_COMMUNITY)
Admission: AD | Admit: 2011-11-09 | Discharge: 2011-11-09 | Disposition: A | Discharge status: Home / Self Care | Payer: Medicaid Other | Source: Ambulatory Visit | Attending: Obstetrics and Gynecology | Admitting: Obstetrics and Gynecology

## 2011-11-09 DIAGNOSIS — IMO0001 Reserved for inherently not codable concepts without codable children: Secondary | ICD-10-CM

## 2011-11-09 DIAGNOSIS — R1031 Right lower quadrant pain: Secondary | ICD-10-CM | POA: Insufficient documentation

## 2011-11-09 DIAGNOSIS — N390 Urinary tract infection, site not specified: Secondary | ICD-10-CM

## 2011-11-09 DIAGNOSIS — K5900 Constipation, unspecified: Secondary | ICD-10-CM

## 2011-11-09 DIAGNOSIS — N949 Unspecified condition associated with female genital organs and menstrual cycle: Secondary | ICD-10-CM | POA: Insufficient documentation

## 2011-11-09 DIAGNOSIS — Z331 Pregnant state, incidental: Secondary | ICD-10-CM

## 2011-11-09 DIAGNOSIS — M79609 Pain in unspecified limb: Secondary | ICD-10-CM | POA: Insufficient documentation

## 2011-11-09 DIAGNOSIS — R109 Unspecified abdominal pain: Secondary | ICD-10-CM

## 2011-11-09 DIAGNOSIS — O99891 Other specified diseases and conditions complicating pregnancy: Secondary | ICD-10-CM | POA: Insufficient documentation

## 2011-11-09 LAB — URINALYSIS, ROUTINE W REFLEX MICROSCOPIC
Glucose, UA: NEGATIVE mg/dL
Ketones, ur: NEGATIVE mg/dL
Leukocytes, UA: NEGATIVE
Protein, ur: NEGATIVE mg/dL
Urobilinogen, UA: 0.2 mg/dL (ref 0.0–1.0)

## 2011-11-09 LAB — WET PREP, GENITAL: Trich, Wet Prep: NONE SEEN

## 2011-11-09 NOTE — MAU Note (Signed)
Pt reports having abd cramping and pelvic pressure for a few days has not gone away. Denies vag discharge or bleeding. Reports good fetal movment

## 2011-11-09 NOTE — MAU Provider Note (Signed)
History   21 yo G3P1011 at 62 6/7 weeks presented unannounced c/o lower pelvic pressure, extending into legs.  On Procardia 10 mg po q 6 hr, taking regularly, with last dose at noon. Able to differentiate contractions from this reported discomfort.  Denies leaking, bleeding, dysuria, recent IC, or any other issues.  Hx of +FFN 10/07/11, with betamethasone given at that time. Cervical length on [place] 11/03/11 = 2.5 cm.  Patient Active Problem List  Diagnosis  . Nausea & vomiting  . Diarrhea  . Scoliosis  . Pregnant state, incidental  . Pollen allergies  . UTI (lower urinary tract infection)  . Reflux  . Constipation  . Preterm labor     Chief Complaint  Patient presents with  . Abdominal Cramping     OB History    Grav Para Term Preterm Abortions TAB SAB Ect Mult Living   3 1 1  1  1   1       Past Medical History  Diagnosis Date  . Heart murmur   . Sickle cell trait   . Anemia     FeSO4 taken  . Scoliosis   . Yeast infection     NOT FREQUENT    Past Surgical History  Procedure Date  . No past surgeries     Family History  Problem Relation Age of Onset  . Anesthesia problems Neg Hx   . Hypotension Neg Hx   . Malignant hyperthermia Neg Hx   . Pseudochol deficiency Neg Hx   . Diabetes Father   . Hypertension Paternal Aunt   . Diabetes Paternal Grandmother     History  Substance Use Topics  . Smoking status: Never Smoker   . Smokeless tobacco: Never Used  . Alcohol Use: No    Allergies: No Known Allergies  Prescriptions prior to admission  Medication Sig Dispense Refill  . calcium carbonate (TUMS - DOSED IN MG ELEMENTAL CALCIUM) 500 MG chewable tablet Chew 1 tablet by mouth daily as needed. For heartburn      . loratadine (CLARITIN) 10 MG tablet Take 10 mg by mouth as needed.      Marland Kitchen NIFEdipine (PROCARDIA) 10 MG capsule Take 10 mg by mouth every 6 (six) hours as needed. For contractions      . Prenatal Vit-Fe Fumarate-FA (PRENATAL MULTIVITAMIN) TABS Take  1 tablet by mouth daily.  30 tablet  11     Physical Exam   Blood pressure 129/63, pulse 103, temperature 98.3 F (36.8 C), temperature source Oral, resp. rate 18, height 5' (1.524 m), weight 147 lb (66.679 kg), last menstrual period 03/31/2011.  Chest clear Heart RRR without murmur Abd gravid, NT Pelvic--small amount white d/c, cervix closed, 50%, vtx, -2. Lower uterine segment still has good thickness.  FHR reactive, no decels UCs--occasional, very mild.  Results for orders placed during the hospital encounter of 11/09/11 (from the past 24 hour(s))  URINALYSIS, ROUTINE W REFLEX MICROSCOPIC     Status: Normal   Collection Time   11/09/11 12:35 PM      Component Value Range   Color, Urine YELLOW  YELLOW   APPearance CLEAR  CLEAR   Specific Gravity, Urine 1.015  1.005 - 1.030   pH 6.5  5.0 - 8.0   Glucose, UA NEGATIVE  NEGATIVE mg/dL   Hgb urine dipstick NEGATIVE  NEGATIVE   Bilirubin Urine NEGATIVE  NEGATIVE   Ketones, ur NEGATIVE  NEGATIVE mg/dL   Protein, ur NEGATIVE  NEGATIVE mg/dL   Urobilinogen,  UA 0.2  0.0 - 1.0 mg/dL   Nitrite NEGATIVE  NEGATIVE   Leukocytes, UA NEGATIVE  NEGATIVE     ED Course  IUP at 31 6/7 weeks 3rd trimester discomfort/RLP Cervix stable Wet prep, GC, chlamydia, GBS done  Plan: D/C home to continue modified BR and Procardia. Keep scheduled appointment at [facility] on 11/17/11. Will f/u with patient if wet prep needs treatment.   Nigel Bridgeman CNM, [place] 11/09/2011 1:42 PM

## 2011-11-10 LAB — GC/CHLAMYDIA PROBE AMP, GENITAL: GC Probe Amp, Genital: NEGATIVE

## 2011-11-11 LAB — CULTURE, BETA STREP (GROUP B ONLY)

## 2011-11-17 ENCOUNTER — Ambulatory Visit (INDEPENDENT_AMBULATORY_CARE_PROVIDER_SITE_OTHER): Payer: Medicaid Other | Admitting: Obstetrics and Gynecology

## 2011-11-17 ENCOUNTER — Encounter: Payer: Self-pay | Admitting: Obstetrics and Gynecology

## 2011-11-17 VITALS — BP 98/58 | Wt 149.0 lb

## 2011-11-17 DIAGNOSIS — Z331 Pregnant state, incidental: Secondary | ICD-10-CM

## 2011-11-17 DIAGNOSIS — Z3490 Encounter for supervision of normal pregnancy, unspecified, unspecified trimester: Secondary | ICD-10-CM

## 2011-11-17 NOTE — Progress Notes (Signed)
Recent hospital visit due to increased pressure

## 2011-11-17 NOTE — Progress Notes (Signed)
[redacted]w[redacted]d C/o pressure and BH Ctx. Patient has been evaluated for threatened PTL. Taking Procardia 10mg s po every 6 hrs scheduled. Declined vaginal examination. Advised Re trying a Pregnancy Belt for uterine support. ROB x 2 weeks

## 2011-11-26 ENCOUNTER — Inpatient Hospital Stay (HOSPITAL_COMMUNITY)
Admission: AD | Admit: 2011-11-26 | Discharge: 2011-11-27 | Disposition: A | Discharge status: Home / Self Care | Payer: Medicaid Other | Source: Ambulatory Visit | Attending: Obstetrics and Gynecology | Admitting: Obstetrics and Gynecology

## 2011-11-26 DIAGNOSIS — K5900 Constipation, unspecified: Secondary | ICD-10-CM

## 2011-11-26 DIAGNOSIS — K219 Gastro-esophageal reflux disease without esophagitis: Secondary | ICD-10-CM | POA: Insufficient documentation

## 2011-11-26 DIAGNOSIS — O99891 Other specified diseases and conditions complicating pregnancy: Secondary | ICD-10-CM | POA: Insufficient documentation

## 2011-11-26 DIAGNOSIS — IMO0001 Reserved for inherently not codable concepts without codable children: Secondary | ICD-10-CM

## 2011-11-26 DIAGNOSIS — O2390 Unspecified genitourinary tract infection in pregnancy, unspecified trimester: Secondary | ICD-10-CM | POA: Insufficient documentation

## 2011-11-26 DIAGNOSIS — N390 Urinary tract infection, site not specified: Secondary | ICD-10-CM | POA: Insufficient documentation

## 2011-11-26 DIAGNOSIS — O4700 False labor before 37 completed weeks of gestation, unspecified trimester: Secondary | ICD-10-CM | POA: Insufficient documentation

## 2011-11-26 NOTE — MAU Note (Signed)
Pt G3 P1 at 34.2wks having cramping, pelvic pressure and  Contractions.  Pt had diarrhea and vomiting earlier this evening took antidiarrhea medication at home.  Diarrhea stopped, continues to feel nausea.

## 2011-11-27 ENCOUNTER — Encounter (HOSPITAL_COMMUNITY): Payer: Self-pay | Admitting: *Deleted

## 2011-11-27 DIAGNOSIS — Z331 Pregnant state, incidental: Secondary | ICD-10-CM

## 2011-11-27 DIAGNOSIS — R197 Diarrhea, unspecified: Secondary | ICD-10-CM

## 2011-11-27 DIAGNOSIS — R112 Nausea with vomiting, unspecified: Secondary | ICD-10-CM

## 2011-11-27 LAB — CBC WITH DIFFERENTIAL/PLATELET
Basophils Absolute: 0 10*3/uL (ref 0.0–0.1)
Basophils Relative: 0 % (ref 0–1)
Eosinophils Absolute: 0 10*3/uL (ref 0.0–0.7)
Eosinophils Relative: 0 % (ref 0–5)
HCT: 36.7 % (ref 36.0–46.0)
Hemoglobin: 13.5 g/dL (ref 12.0–15.0)
Lymphocytes Relative: 7 % — ABNORMAL LOW (ref 12–46)
Lymphs Abs: 0.8 10*3/uL (ref 0.7–4.0)
MCH: 31.1 pg (ref 26.0–34.0)
MCHC: 36.8 g/dL — ABNORMAL HIGH (ref 30.0–36.0)
MCV: 84.6 fL (ref 78.0–100.0)
Monocytes Absolute: 0.8 10*3/uL (ref 0.1–1.0)
Monocytes Relative: 7 % (ref 3–12)
Neutro Abs: 10.1 10*3/uL — ABNORMAL HIGH (ref 1.7–7.7)
Neutrophils Relative %: 86 % — ABNORMAL HIGH (ref 43–77)
Platelets: 185 10*3/uL (ref 150–400)
RBC: 4.34 MIL/uL (ref 3.87–5.11)
RDW: 13.2 % (ref 11.5–15.5)
WBC: 11.7 10*3/uL — ABNORMAL HIGH (ref 4.0–10.5)

## 2011-11-27 LAB — COMPREHENSIVE METABOLIC PANEL
AST: 22 U/L (ref 0–37)
Albumin: 3.4 g/dL — ABNORMAL LOW (ref 3.5–5.2)
Calcium: 9 mg/dL (ref 8.4–10.5)
Creatinine, Ser: 0.45 mg/dL — ABNORMAL LOW (ref 0.50–1.10)
Total Protein: 6.4 g/dL (ref 6.0–8.3)

## 2011-11-27 LAB — URINALYSIS, ROUTINE W REFLEX MICROSCOPIC
Glucose, UA: NEGATIVE mg/dL
Hgb urine dipstick: NEGATIVE
Ketones, ur: >80 mg/dL — AB
Leukocytes, UA: NEGATIVE
Protein, ur: NEGATIVE mg/dL
Urobilinogen, UA: 0.2 mg/dL (ref 0.0–1.0)

## 2011-11-27 MED ORDER — DEXTROSE 5 % IN LACTATED RINGERS IV BOLUS
1000.0000 mL | Freq: Once | INTRAVENOUS | Status: AC
  Administered 2011-11-27: 1000 mL via INTRAVENOUS

## 2011-11-27 MED ORDER — LACTATED RINGERS IV SOLN: INTRAVENOUS | Status: DC

## 2011-11-27 NOTE — MAU Note (Signed)
Pt states she has been having pain and nausea and vomiting

## 2011-11-27 NOTE — Progress Notes (Signed)
History  Allison Mathews is a 21 y.o. G3P1011 at [redacted]w[redacted]d   Subjective:  c/o of diarrhea and not feeling well for 2 days, vomit x one at 1930, took OTC antidiarrhea meds and phenergan and resolved. Called with c/o of pressure and instructed to come to [facility] for evaluation, no vomit or diarrhea since 1930, with +FM denies srom, vag bleeding, or uc.  Chief Complaint  Patient presents with  . Labor Eval   @SFHPI @  Vitals:  Blood pressure 119/64, pulse 93, temperature 98.4 F (36.9 C), temperature source Oral, resp. rate 18, height 5' (1.524 m), weight 148 lb 3.2 oz (67.223 kg), last menstrual period 03/31/2011. OB History    Grav Para Term Preterm Abortions TAB SAB Ect Mult Living   3 1 1  1  1   1       Past Medical History  Diagnosis Date  . Heart murmur   . Sickle cell trait   . Anemia     FeSO4 taken  . Scoliosis   . Yeast infection     NOT FREQUENT    Past Surgical History  Procedure Date  . No past surgeries     Family History  Problem Relation Age of Onset  . Anesthesia problems Neg Hx   . Hypotension Neg Hx   . Malignant hyperthermia Neg Hx   . Pseudochol deficiency Neg Hx   . Diabetes Father   . Hypertension Paternal Aunt   . Diabetes Paternal Grandmother     History  Substance Use Topics  . Smoking status: Never Smoker   . Smokeless tobacco: Never Used  . Alcohol Use: No    Allergies: No Known Allergies  Prescriptions prior to admission  Medication Sig Dispense Refill  . calcium carbonate (TUMS - DOSED IN MG ELEMENTAL CALCIUM) 500 MG chewable tablet Chew 1 tablet by mouth daily as needed. For heartburn      . loratadine (CLARITIN) 10 MG tablet Take 10 mg by mouth as needed.      Marland Kitchen NIFEdipine (PROCARDIA) 10 MG capsule Take 10 mg by mouth every 6 (six) hours as needed. For contractions      . Prenatal Vit-Fe Fumarate-FA (PRENATAL MULTIVITAMIN) TABS Take 1 tablet by mouth daily.  30 tablet  11    @ROS @ Physical Exam   Blood pressure 119/64, pulse 93,  temperature 98.4 F (36.9 C), temperature source Oral, resp. rate 18, height 5' (1.524 m), weight 148 lb 3.2 oz (67.223 kg), last menstrual period 03/31/2011.  @PHYSEXAMBYAGE2 @ Labs:  No results found for this or any previous visit (from the past 24 hour(s)).  Idication reviewed/display:[id number]}  ASSESSMENT: Patient Active Problem List  Diagnosis  . Nausea & vomiting  . Diarrhea  . Scoliosis  . Pregnant state, incidental  . Pollen allergies  . UTI (lower urinary tract infection)  . Reflux  . Constipation  . Preterm labor    Results for orders placed during the hospital encounter of 11/26/11 (from the past 24 hour(s))  URINALYSIS, ROUTINE W REFLEX MICROSCOPIC     Status: Abnormal   Collection Time   11/26/11 11:55 PM      Component Value Range   Color, Urine YELLOW  YELLOW   APPearance CLEAR  CLEAR   Specific Gravity, Urine >1.030 (*) 1.005 - 1.030   pH 5.5  5.0 - 8.0   Glucose, UA NEGATIVE  NEGATIVE mg/dL   Hgb urine dipstick NEGATIVE  NEGATIVE   Bilirubin Urine NEGATIVE  NEGATIVE   Ketones, ur >  80 (*) NEGATIVE mg/dL   Protein, ur NEGATIVE  NEGATIVE mg/dL   Urobilinogen, UA 0.2  0.0 - 1.0 mg/dL   Nitrite NEGATIVE  NEGATIVE   Leukocytes, UA NEGATIVE  NEGATIVE  CBC WITH DIFFERENTIAL     Status: Abnormal   Collection Time   11/27/11 12:30 AM      Component Value Range   WBC 11.7 (*) 4.0 - 10.5 K/uL   RBC 4.34  3.87 - 5.11 MIL/uL   Hemoglobin 13.5  12.0 - 15.0 g/dL   HCT 96.0  45.4 - 09.8 %   MCV 84.6  78.0 - 100.0 fL   MCH 31.1  26.0 - 34.0 pg   MCHC 36.8 (*) 30.0 - 36.0 g/dL   RDW 11.9  14.7 - 82.9 %   Platelets 185  150 - 400 K/uL   Neutrophils Relative 86 (*) 43 - 77 %   Neutro Abs 10.1 (*) 1.7 - 7.7 K/uL   Lymphocytes Relative 7 (*) 12 - 46 %   Lymphs Abs 0.8  0.7 - 4.0 K/uL   Monocytes Relative 7  3 - 12 %   Monocytes Absolute 0.8  0.1 - 1.0 K/uL   Eosinophils Relative 0  0 - 5 %   Eosinophils Absolute 0.0  0.0 - 0.7 K/uL   Basophils Relative 0  0 - 1 %    Basophils Absolute 0.0  0.0 - 0.1 K/uL  COMPREHENSIVE METABOLIC PANEL     Status: Abnormal   Collection Time   11/27/11 12:30 AM      Component Value Range   Sodium 135  135 - 145 mEq/L   Potassium 3.5  3.5 - 5.1 mEq/L   Chloride 100  96 - 112 mEq/L   CO2 22  19 - 32 mEq/L   Glucose, Bld 98  70 - 99 mg/dL   BUN 6  6 - 23 mg/dL   Creatinine, Ser 5.62 (*) 0.50 - 1.10 mg/dL   Calcium 9.0  8.4 - 13.0 mg/dL   Total Protein 6.4  6.0 - 8.3 g/dL   Albumin 3.4 (*) 3.5 - 5.2 g/dL   AST 22  0 - 37 U/L   ALT 21  0 - 35 U/L   Alkaline Phosphatase 80  39 - 117 U/L   Total Bilirubin 0.5  0.3 - 1.2 mg/dL   GFR calc non Af Amer >90  >90 mL/min   GFR calc Af Amer >90  >90 mL/min  Calm, no distress, HEENT WNL grossly, lungs clear bilaterally, AP RRR, abd soft nt,no masses, not tympanic bowel sounds hypoactive, abdomen nontender, FHTs category 1 UC slight irritability Vag 1 50 ballotable posterior intact on admission ED Course  Assessment/Plan [redacted]w[redacted]d Ketonuria IV bolus, clear liquid diet x 24 hour, no caffiene, sprite or ginger ale over ice, the [employer], bananas, rice, applesauce toast. Lavera Guise, CNM Addendum: 0130 labs reviewed with Dr. Kirt Boys per telephone plan home after hydration, restrict actiivity, pelvic rest, f/o office next week. Lavera Guise, CNM Addendum: 0330 ketones resolved tolerating po without N,V Lavera Guise, CNM

## 2011-12-02 ENCOUNTER — Encounter: Payer: Medicaid Other | Admitting: Obstetrics and Gynecology

## 2011-12-02 ENCOUNTER — Ambulatory Visit (INDEPENDENT_AMBULATORY_CARE_PROVIDER_SITE_OTHER): Payer: Medicaid Other | Admitting: Obstetrics and Gynecology

## 2011-12-02 VITALS — BP 98/58 | Wt 150.0 lb

## 2011-12-02 DIAGNOSIS — Z331 Pregnant state, incidental: Secondary | ICD-10-CM

## 2011-12-02 NOTE — Progress Notes (Signed)
Wants cx check today.

## 2011-12-02 NOTE — Progress Notes (Signed)
[redacted]w[redacted]d No complaints

## 2011-12-02 NOTE — Progress Notes (Signed)
Pt stated she thinks she lost her mucous  plug the other day . Pt also stated she thinks she is getting sick ?Marland Kitchen No other issues today .

## 2011-12-12 ENCOUNTER — Ambulatory Visit (INDEPENDENT_AMBULATORY_CARE_PROVIDER_SITE_OTHER): Payer: Medicaid Other | Admitting: Obstetrics and Gynecology

## 2011-12-12 VITALS — BP 100/60 | Wt 150.0 lb

## 2011-12-12 DIAGNOSIS — Z331 Pregnant state, incidental: Secondary | ICD-10-CM

## 2011-12-12 NOTE — Progress Notes (Signed)
[redacted]w[redacted]d Fatigue. More contractions. No change in Discharge. GBS/GC/Chlamydia done

## 2011-12-12 NOTE — Addendum Note (Signed)
Addended by: Larwance Rote on: 12/12/2011 02:35 PM   Modules accepted: Orders

## 2011-12-12 NOTE — Addendum Note (Signed)
Addended by: Larwance Rote on: 12/12/2011 02:20 PM   Modules accepted: Orders

## 2011-12-13 LAB — GC/CHLAMYDIA PROBE AMP, GENITAL: Chlamydia, DNA Probe: NEGATIVE

## 2011-12-14 LAB — STREP B DNA PROBE: GBSP: NEGATIVE

## 2011-12-16 ENCOUNTER — Encounter (HOSPITAL_COMMUNITY): Payer: Self-pay | Admitting: *Deleted

## 2011-12-16 ENCOUNTER — Inpatient Hospital Stay (HOSPITAL_COMMUNITY)
Admission: AD | Admit: 2011-12-16 | Discharge: 2011-12-16 | Disposition: A | Discharge status: Home / Self Care | Payer: Medicaid Other | Source: Ambulatory Visit | Attending: Obstetrics and Gynecology | Admitting: Obstetrics and Gynecology

## 2011-12-16 DIAGNOSIS — O479 False labor, unspecified: Secondary | ICD-10-CM | POA: Insufficient documentation

## 2011-12-16 NOTE — MAU Note (Signed)
Pt presents with complaints of contractions since last pm and states more frequent today. Denies any bleeding, States some vaginal discharge.

## 2011-12-18 ENCOUNTER — Telehealth: Payer: Self-pay | Admitting: Obstetrics and Gynecology

## 2011-12-18 NOTE — Telephone Encounter (Signed)
Reviewed s/s uc, srom, vag bleeding, daily fetal kick counts to report, comfort measures. Encouragged 8 water daily and frequent voids. Nahima Ales, CNM   

## 2011-12-19 ENCOUNTER — Encounter: Payer: Medicaid Other | Admitting: Obstetrics and Gynecology

## 2011-12-19 ENCOUNTER — Inpatient Hospital Stay (HOSPITAL_COMMUNITY)
Admission: AD | Admit: 2011-12-19 | Discharge: 2011-12-19 | Disposition: A | Discharge status: Home / Self Care | Payer: Medicaid Other | Source: Ambulatory Visit | Attending: Obstetrics and Gynecology | Admitting: Obstetrics and Gynecology

## 2011-12-19 ENCOUNTER — Telehealth: Payer: Self-pay | Admitting: Obstetrics and Gynecology

## 2011-12-19 ENCOUNTER — Encounter (HOSPITAL_COMMUNITY): Payer: Self-pay | Admitting: *Deleted

## 2011-12-19 DIAGNOSIS — N390 Urinary tract infection, site not specified: Secondary | ICD-10-CM

## 2011-12-19 DIAGNOSIS — O479 False labor, unspecified: Secondary | ICD-10-CM

## 2011-12-19 DIAGNOSIS — K5900 Constipation, unspecified: Secondary | ICD-10-CM

## 2011-12-19 DIAGNOSIS — O99891 Other specified diseases and conditions complicating pregnancy: Secondary | ICD-10-CM | POA: Insufficient documentation

## 2011-12-19 DIAGNOSIS — IMO0001 Reserved for inherently not codable concepts without codable children: Secondary | ICD-10-CM

## 2011-12-19 LAB — AMNISURE RUPTURE OF MEMBRANE (ROM) NOT AT ARMC: Amnisure ROM: NEGATIVE

## 2011-12-19 NOTE — Telephone Encounter (Signed)
If needing to wear a pad because panties are wet, trickling, dripping, gushing needs to be seen earlier, has f/o at 130 pm plan to keep appointment and report to provider. Reviewed s/s uc, srom, vag bleeding, daily fetal kick counts to report, comfort measures. Encouragged 8 water daily and frequent voids. Lavera Guise, CNM

## 2011-12-19 NOTE — MAU Note (Signed)
History   21 yo G3P1011 at 37 4/7 weeks presented unannounced c/o ? Leaking and contractions.  Denies bleeding, reports UCs "the same" as earlier this week.  Pregnancy remarkable for: Patient Active Problem List  Diagnosis  . Nausea & vomiting  . Diarrhea  . Scoliosis  . Pregnant state, incidental  . Pollen allergies  . UTI (lower urinary tract infection)  . Reflux  . Constipation  . Preterm labor     Chief Complaint  Patient presents with  . Labor Eval     OB History    Grav Para Term Preterm Abortions TAB SAB Ect Mult Living   3 1 1  1  1   1       Past Medical History  Diagnosis Date  . Heart murmur   . Sickle cell trait   . Anemia     FeSO4 taken  . Scoliosis   . Yeast infection     NOT FREQUENT    Past Surgical History  Procedure Date  . No past surgeries     Family History  Problem Relation Age of Onset  . Anesthesia problems Neg Hx   . Hypotension Neg Hx   . Malignant hyperthermia Neg Hx   . Pseudochol deficiency Neg Hx   . Diabetes Father   . Hypertension Paternal Aunt   . Diabetes Paternal Grandmother     History  Substance Use Topics  . Smoking status: Never Smoker   . Smokeless tobacco: Never Used  . Alcohol Use: No    Allergies: No Known Allergies  Prescriptions prior to admission  Medication Sig Dispense Refill  . loratadine (CLARITIN) 10 MG tablet Take 10 mg by mouth daily as needed. For allergies      . Prenatal Vit-Fe Fumarate-FA (PRENATAL MULTIVITAMIN) TABS Take 1 tablet by mouth daily.  30 tablet  11     Physical Exam   Blood pressure 113/64, pulse 69, temperature 97.7 F (36.5 C), temperature source Oral, resp. rate 16, height 4\' 11"  (1.499 m), weight 150 lb (68.04 kg), last menstrual period 03/31/2011.  Chest clear Heart RRR without murmur Abd gravid, NT Pelvic--done by RN, cervix still 3 cm, 80%, vtx, -1 No evidence leaking. Ext WNL  FHR reactive, no decels UCs sporadic, mild  Amnisure negative  ED Course    IUP at 37 4/7 weeks No evidence SROM or labor Patient "ready to go home"  D/C home with labor precautions. Had appt at [facility] scheduled for today--will have office call patient to schedule ROB for next week.  Nigel Bridgeman, CNM    Kelce Bouton CNM, [place] 12/19/2011 11:39 AM

## 2011-12-19 NOTE — MAU Note (Signed)
Patient states she is having irregular contractions, denies bleeding or leaking. States the baby has not moved much today.

## 2011-12-19 NOTE — MAU Note (Signed)
?   SROM @ 0640;

## 2011-12-23 ENCOUNTER — Telehealth: Payer: Self-pay | Admitting: Obstetrics and Gynecology

## 2011-12-24 ENCOUNTER — Ambulatory Visit (INDEPENDENT_AMBULATORY_CARE_PROVIDER_SITE_OTHER): Payer: Medicaid Other | Admitting: Obstetrics and Gynecology

## 2011-12-24 VITALS — BP 98/52 | Wt 151.0 lb

## 2011-12-24 DIAGNOSIS — Z331 Pregnant state, incidental: Secondary | ICD-10-CM

## 2011-12-24 NOTE — Progress Notes (Signed)
Pt stated been having contraction's every 7 minutes apart for the past 3 days. Pt wants cervix check. Pt stated no other issues.

## 2011-12-24 NOTE — Progress Notes (Signed)
[redacted]w[redacted]d GFM Irregular contractions but more frequent with pressure No bleeding No LOF

## 2011-12-26 ENCOUNTER — Telehealth: Payer: Self-pay | Admitting: Obstetrics and Gynecology

## 2011-12-26 NOTE — Telephone Encounter (Signed)
Pt called, is 38w 4d, states has been having bldg 3 days ago, then stopped and started again, also states has been dizzy and weak having some sharp lower abd pain. Pt does have +fm.  Pt admits to bldg to be pink streaks of blood mixed in a mucusy d/c, also admits to not eating and drinking bc does not want to at times.  Pt informed bldg is the bloody show which can happen over time, does not happen all at once also advised pt she needs to push fluids and eat often, small meals q 2-3 hours.  Informed pt if bldg becomes heavy, bright red or any other concerns to call back.  Next appt is on Tuesday 12/30/11, pt voices understanding.

## 2011-12-27 ENCOUNTER — Inpatient Hospital Stay (HOSPITAL_COMMUNITY)
Admission: AD | Admit: 2011-12-27 | Discharge: 2011-12-27 | Disposition: A | Discharge status: Home / Self Care | Payer: Medicaid Other | Source: Ambulatory Visit | Attending: Obstetrics and Gynecology | Admitting: Obstetrics and Gynecology

## 2011-12-27 ENCOUNTER — Telehealth: Payer: Self-pay | Admitting: Obstetrics and Gynecology

## 2011-12-27 DIAGNOSIS — O479 False labor, unspecified: Secondary | ICD-10-CM

## 2011-12-27 NOTE — Progress Notes (Signed)
History  Allison Mathews is a 21 y.o. G3P1011 at [redacted]w[redacted]d   Subjective:  Allison Mathews  21 y.o. [redacted]w[redacted]d  C/o of uc, denies srom, with vag bleeding with wiping only, with +FM, no N,V,D, constipation, or UTI s/s.    Chief Complaint  Patient presents with  . Contractions  . Vaginal Bleeding   @SFHPI @  Vitals:  Blood pressure 116/67, pulse 64, temperature 98.1 F (36.7 C), temperature source Oral, resp. rate 18, last menstrual period 03/31/2011. OB History    Grav Para Term Preterm Abortions TAB SAB Ect Mult Living   3 1 1  1  1   1       Past Medical History  Diagnosis Date  . Heart murmur   . Sickle cell trait   . Anemia     FeSO4 taken  . Scoliosis   . Yeast infection     NOT FREQUENT    Past Surgical History  Procedure Date  . No past surgeries     Family History  Problem Relation Age of Onset  . Anesthesia problems Neg Hx   . Hypotension Neg Hx   . Malignant hyperthermia Neg Hx   . Pseudochol deficiency Neg Hx   . Diabetes Father   . Hypertension Paternal Aunt   . Diabetes Paternal Grandmother     History  Substance Use Topics  . Smoking status: Never Smoker   . Smokeless tobacco: Never Used  . Alcohol Use: No    Allergies: No Known Allergies  Prescriptions prior to admission  Medication Sig Dispense Refill  . loratadine (CLARITIN) 10 MG tablet Take 10 mg by mouth daily as needed. For allergies      . Prenatal Vit-Fe Fumarate-FA (PRENATAL MULTIVITAMIN) TABS Take 1 tablet by mouth daily.        @ROS @ Physical Exam  Calm, quiet abdomen nontender, Normal hair distrubition mons pubis,  EGBUS WNL, sterile speculum exam,  vagina pink, moist normal rugae,  Scant red to brown discharge no srom Vag 3-4 100 -1 VTX Intact reassessed after ambulation, unchanged. fhts 130s LTV min to mod uc q 4-6 mild  Blood pressure 116/67, pulse 64, temperature 98.1 F (36.7 C), temperature source Oral, resp. rate 18, last menstrual period 03/31/2011.  I have  reviewed the patient's current medications.  ASSESSMENT: Patient Active Problem List  Diagnosis  . Nausea & vomiting  . Diarrhea  . Scoliosis  . Pregnant state, incidental  . Pollen allergies  . UTI (lower urinary tract infection)  . Reflux  . Constipation  . Preterm labor  A [redacted]w[redacted]d    Neg OCT P Reviewed s/s uc, srom, vag bleeding, daily fetal kick counts to report, comfort measures. Encouraged 8 water daily and frequent voids. Lavera Guise, CNM

## 2011-12-27 NOTE — MAU Note (Signed)
"  I've been having UC's since 0630 every 5 mins.  Then they became every 3 mins at 0700.  They seem to be spacing out.  I talked to [facility] and she told me to come on in.  I just didn't want to take any chances.  I'm bleeding a lot.  There was blood every time I wiped while I was in the BR here."

## 2011-12-27 NOTE — Telephone Encounter (Signed)
TC from pt reports vag bleeding, sm amt, mucousy, no LOF, irregular ctx, but getting stronger, +FM. Pt states she's not had anything to drink since last night. Instructed to push PO fluids, and come to MAU for evaluation

## 2011-12-29 ENCOUNTER — Encounter (HOSPITAL_COMMUNITY): Payer: Self-pay | Admitting: *Deleted

## 2011-12-29 ENCOUNTER — Other Ambulatory Visit: Payer: Self-pay

## 2011-12-29 ENCOUNTER — Inpatient Hospital Stay (HOSPITAL_COMMUNITY): Payer: Medicaid Other | Admitting: Anesthesiology

## 2011-12-29 ENCOUNTER — Encounter: Payer: Self-pay | Admitting: Obstetrics and Gynecology

## 2011-12-29 ENCOUNTER — Telehealth: Payer: Self-pay | Admitting: Obstetrics and Gynecology

## 2011-12-29 ENCOUNTER — Inpatient Hospital Stay (HOSPITAL_COMMUNITY)
Admission: AD | Admit: 2011-12-29 | Discharge: 2011-12-31 | DRG: 775 | Disposition: A | Discharge status: Home / Self Care | Payer: Medicaid Other | Source: Ambulatory Visit | Attending: Obstetrics and Gynecology | Admitting: Obstetrics and Gynecology

## 2011-12-29 ENCOUNTER — Encounter (HOSPITAL_COMMUNITY): Payer: Self-pay | Admitting: Anesthesiology

## 2011-12-29 ENCOUNTER — Ambulatory Visit (INDEPENDENT_AMBULATORY_CARE_PROVIDER_SITE_OTHER): Payer: Medicaid Other | Admitting: Obstetrics and Gynecology

## 2011-12-29 VITALS — BP 100/58 | Wt 152.0 lb

## 2011-12-29 DIAGNOSIS — K5900 Constipation, unspecified: Secondary | ICD-10-CM

## 2011-12-29 DIAGNOSIS — IMO0001 Reserved for inherently not codable concepts without codable children: Secondary | ICD-10-CM

## 2011-12-29 DIAGNOSIS — Z331 Pregnant state, incidental: Secondary | ICD-10-CM

## 2011-12-29 DIAGNOSIS — N390 Urinary tract infection, site not specified: Secondary | ICD-10-CM

## 2011-12-29 HISTORY — DX: Reserved for inherently not codable concepts without codable children: IMO0001

## 2011-12-29 LAB — CBC
Hemoglobin: 13 g/dL (ref 12.0–15.0)
MCH: 30.8 pg (ref 26.0–34.0)
MCHC: 36 g/dL (ref 30.0–36.0)
MCV: 85.5 fL (ref 78.0–100.0)
RBC: 4.22 MIL/uL (ref 3.87–5.11)

## 2011-12-29 MED ORDER — SIMETHICONE 80 MG PO CHEW: 80.0000 mg | CHEWABLE_TABLET | ORAL | Status: DC | PRN

## 2011-12-29 MED ORDER — LACTATED RINGERS IV SOLN
INTRAVENOUS | Status: DC
  Administered 2011-12-29: 125 mL/h via INTRAVENOUS

## 2011-12-29 MED ORDER — ONDANSETRON HCL 4 MG PO TABS: 4.0000 mg | ORAL_TABLET | ORAL | Status: DC | PRN

## 2011-12-29 MED ORDER — ACETAMINOPHEN 325 MG PO TABS
650.0000 mg | ORAL_TABLET | ORAL | Status: DC | PRN
  Filled 2011-12-29: qty 1

## 2011-12-29 MED ORDER — ONDANSETRON HCL 4 MG/2ML IJ SOLN: 4.0000 mg | Freq: Four times a day (QID) | INTRAMUSCULAR | Status: DC | PRN

## 2011-12-29 MED ORDER — PHENYLEPHRINE 40 MCG/ML (10ML) SYRINGE FOR IV PUSH (FOR BLOOD PRESSURE SUPPORT)
80.0000 ug | PREFILLED_SYRINGE | INTRAVENOUS | Status: DC | PRN
  Filled 2011-12-29: qty 5

## 2011-12-29 MED ORDER — IBUPROFEN 600 MG PO TABS
600.0000 mg | ORAL_TABLET | Freq: Four times a day (QID) | ORAL | Status: DC
  Administered 2011-12-30 – 2011-12-31 (×6): 600 mg via ORAL
  Filled 2011-12-29 (×7): qty 1

## 2011-12-29 MED ORDER — HYDROXYZINE HCL 50 MG PO TABS: 50.0000 mg | ORAL_TABLET | Freq: Four times a day (QID) | ORAL | Status: DC | PRN

## 2011-12-29 MED ORDER — OXYCODONE-ACETAMINOPHEN 5-325 MG PO TABS
1.0000 | ORAL_TABLET | ORAL | Status: DC | PRN
  Administered 2011-12-30 – 2011-12-31 (×6): 1 via ORAL
  Filled 2011-12-29 (×6): qty 1

## 2011-12-29 MED ORDER — DIPHENHYDRAMINE HCL 50 MG/ML IJ SOLN: 12.5000 mg | INTRAMUSCULAR | Status: DC | PRN

## 2011-12-29 MED ORDER — ZOLPIDEM TARTRATE 5 MG PO TABS: 5.0000 mg | ORAL_TABLET | Freq: Every evening | ORAL | Status: DC | PRN

## 2011-12-29 MED ORDER — OXYCODONE-ACETAMINOPHEN 5-325 MG PO TABS: 1.0000 | ORAL_TABLET | ORAL | Status: DC | PRN

## 2011-12-29 MED ORDER — FENTANYL 2.5 MCG/ML BUPIVACAINE 1/10 % EPIDURAL INFUSION (WH - ANES)
14.0000 mL/h | INTRAMUSCULAR | Status: DC
  Administered 2011-12-29: 14 mL/h via EPIDURAL
  Filled 2011-12-29 (×2): qty 60

## 2011-12-29 MED ORDER — HYDROXYZINE HCL 50 MG/ML IM SOLN: 50.0000 mg | Freq: Four times a day (QID) | INTRAMUSCULAR | Status: DC | PRN

## 2011-12-29 MED ORDER — DIBUCAINE 1 % RE OINT: 1.0000 "application " | TOPICAL_OINTMENT | RECTAL | Status: DC | PRN

## 2011-12-29 MED ORDER — SODIUM BICARBONATE 8.4 % IV SOLN
INTRAVENOUS | Status: DC | PRN
  Administered 2011-12-29: 3 mL via EPIDURAL

## 2011-12-29 MED ORDER — PRENATAL MULTIVITAMIN CH
1.0000 | ORAL_TABLET | Freq: Every day | ORAL | Status: DC
  Administered 2011-12-30 – 2011-12-31 (×2): 1 via ORAL
  Filled 2011-12-29 (×2): qty 1

## 2011-12-29 MED ORDER — BENZOCAINE-MENTHOL 20-0.5 % EX AERO: 1.0000 "application " | INHALATION_SPRAY | CUTANEOUS | Status: DC | PRN

## 2011-12-29 MED ORDER — EPHEDRINE 5 MG/ML INJ: 10.0000 mg | INTRAVENOUS | Status: DC | PRN

## 2011-12-29 MED ORDER — SENNOSIDES-DOCUSATE SODIUM 8.6-50 MG PO TABS
2.0000 | ORAL_TABLET | Freq: Every day | ORAL | Status: DC
  Administered 2011-12-30: 2 via ORAL

## 2011-12-29 MED ORDER — LANOLIN HYDROUS EX OINT: TOPICAL_OINTMENT | CUTANEOUS | Status: DC | PRN

## 2011-12-29 MED ORDER — IBUPROFEN 600 MG PO TABS
600.0000 mg | ORAL_TABLET | Freq: Four times a day (QID) | ORAL | Status: DC | PRN
  Administered 2011-12-29: 600 mg via ORAL
  Filled 2011-12-29: qty 1

## 2011-12-29 MED ORDER — MAGNESIUM HYDROXIDE 400 MG/5ML PO SUSP: 30.0000 mL | ORAL | Status: DC | PRN

## 2011-12-29 MED ORDER — EPHEDRINE 5 MG/ML INJ
10.0000 mg | INTRAVENOUS | Status: DC | PRN
  Filled 2011-12-29: qty 4

## 2011-12-29 MED ORDER — LACTATED RINGERS IV SOLN
500.0000 mL | Freq: Once | INTRAVENOUS | Status: AC
  Administered 2011-12-29: 500 mL via INTRAVENOUS

## 2011-12-29 MED ORDER — FENTANYL CITRATE 0.05 MG/ML IJ SOLN: 100.0000 ug | INTRAMUSCULAR | Status: DC | PRN

## 2011-12-29 MED ORDER — METHYLERGONOVINE MALEATE 0.2 MG/ML IJ SOLN: 0.2000 mg | INTRAMUSCULAR | Status: DC | PRN

## 2011-12-29 MED ORDER — FENTANYL 2.5 MCG/ML BUPIVACAINE 1/10 % EPIDURAL INFUSION (WH - ANES)
INTRAMUSCULAR | Status: DC | PRN
  Administered 2011-12-29: 12 mL/h via EPIDURAL

## 2011-12-29 MED ORDER — CITRIC ACID-SODIUM CITRATE 334-500 MG/5ML PO SOLN: 30.0000 mL | ORAL | Status: DC | PRN

## 2011-12-29 MED ORDER — TETANUS-DIPHTH-ACELL PERTUSSIS 5-2.5-18.5 LF-MCG/0.5 IM SUSP: 0.5000 mL | Freq: Once | INTRAMUSCULAR | Status: DC

## 2011-12-29 MED ORDER — LIDOCAINE HCL (PF) 1 % IJ SOLN: 30.0000 mL | INTRAMUSCULAR | Status: DC | PRN

## 2011-12-29 MED ORDER — WITCH HAZEL-GLYCERIN EX PADS: 1.0000 "application " | MEDICATED_PAD | CUTANEOUS | Status: DC | PRN

## 2011-12-29 MED ORDER — DIPHENHYDRAMINE HCL 25 MG PO CAPS: 25.0000 mg | ORAL_CAPSULE | Freq: Four times a day (QID) | ORAL | Status: DC | PRN

## 2011-12-29 MED ORDER — METHYLERGONOVINE MALEATE 0.2 MG PO TABS: 0.2000 mg | ORAL_TABLET | ORAL | Status: DC | PRN

## 2011-12-29 MED ORDER — LACTATED RINGERS IV SOLN: 500.0000 mL | INTRAVENOUS | Status: DC | PRN

## 2011-12-29 MED ORDER — ONDANSETRON HCL 4 MG/2ML IJ SOLN: 4.0000 mg | INTRAMUSCULAR | Status: DC | PRN

## 2011-12-29 MED ORDER — OXYTOCIN BOLUS FROM INFUSION
500.0000 mL | Freq: Once | INTRAVENOUS | Status: AC
  Administered 2011-12-29: 500 mL via INTRAVENOUS
  Filled 2011-12-29: qty 500

## 2011-12-29 MED ORDER — PHENYLEPHRINE 40 MCG/ML (10ML) SYRINGE FOR IV PUSH (FOR BLOOD PRESSURE SUPPORT): 80.0000 ug | PREFILLED_SYRINGE | INTRAVENOUS | Status: DC | PRN

## 2011-12-29 MED ORDER — OXYTOCIN 40 UNITS IN LACTATED RINGERS INFUSION - SIMPLE MED
62.5000 mL/h | Freq: Once | INTRAVENOUS | Status: DC
  Filled 2011-12-29: qty 1000

## 2011-12-29 NOTE — Progress Notes (Signed)
[redacted]w[redacted]d NST reactive, Ctx 3-4 min Cx=7cm, BBOW Bloody show Sent to [facility] for admission, CNM notified

## 2011-12-29 NOTE — H&P (Signed)
Allison Mathews is a 21 y.o.black female presenting from office as direct admit at 39 weeks for active labor.  Reports ctxs irregular throughout the night, that kept waking her up, but worsened this morning.  She desires an epidural.  Denies LOF or VB.  Decreased FM at office and had NST there before her cx was checked.  Denies UTI or PIH s/s.  No recent illness or fever.  Accompanied by her older son who is 3y.o. And her s.o.  Only po intake today is some crackers at office.    Prenatal Course: Pt presented to MAU at [redacted]w[redacted]d w/ N/v/Gerd, but delay in further care until [redacted]w[redacted]d when presented to [facility] for her NOB work-up.  UTI dx'd at that time and pt treated.  Normal anatomy scan and Quad screen.  Treated for yeast vaginitis at [redacted]w[redacted]d with Terazol.  Had a positive FFN at [redacted]w[redacted]d (10/07/11) and received BMZ series and taken OOW and placed on BR; cx at that time closed.  Cervical length measured 2.5 cm in third trimester, and she was Rx'd Procardia po prn. At [redacted]w[redacted]d cx=1.5/50%.  At [redacted]w[redacted]d cx=3/80%/-2.    OB Hx: G1=SAB G2=VE '10 M=6+9 (VPH) G3=current Maternal Medical History:  Reason for admission: Reason for admission: contractions.  Contractions: Onset was 6-12 hours ago.   Frequency: regular.   Perceived severity is moderate.    Fetal activity: Perceived fetal activity is decreased.   Last perceived fetal movement was within the past 12 hours.      OB History    Grav Para Term Preterm Abortions TAB SAB Ect Mult Living   3 1 1  1  1   1      Past Medical History  Diagnosis Date  . Heart murmur   . Sickle cell trait   . Anemia     FeSO4 taken  . Scoliosis   . Yeast infection     NOT FREQUENT  . Active labor 12/29/2011   Past Surgical History  Procedure Date  . No past surgeries    Family History: family history includes Diabetes in her father and paternal grandmother and Hypertension in her paternal aunt.  There is no history of Anesthesia problems, and Hypotension, and Malignant  hyperthermia, and Pseudochol deficiency, . Social History:  reports that she has never smoked. She has never used smokeless tobacco. She reports that she does not drink alcohol or use illicit drugs.Pt was working at [employer] prior to being taken OOW at 27 weeks for positive FFN.     Prenatal Transfer Tool  Maternal Diabetes: No Genetic Screening: Normal Maternal Ultrasounds/Referrals: Normal Fetal Ultrasounds or other Referrals:  None Maternal Substance Abuse:  No Significant Maternal Medications:  None Significant Maternal Lab Results:  Lab values include: Group B Strep negative Other Comments:  Received BMZ course at 27 weeks secondary to Positive FFN, but cx closed until 35 weeks; was put on BR and prn Procardia  Review of Systems  Constitutional: Negative.   HENT: Negative.   Eyes: Negative.   Respiratory: Negative.   Cardiovascular: Negative.   Gastrointestinal: Negative.   Genitourinary: Negative.   Skin: Negative.       Blood pressure 130/84, pulse 70, height 5' (1.524 m), weight 152 lb (68.947 kg), last menstrual period 03/31/2011. Maternal Exam:  Uterine Assessment: Contraction strength is moderate.  Contraction frequency is irregular.  uc's q 4-6 min  Abdomen: Patient reports no abdominal tenderness. Fetal presentation: vertex  Introitus: not evaluated.   Pelvis: adequate for  delivery.   Cervix: not evaluated.   Fetal Exam Fetal Monitor Review: Mode: ultrasound.   Baseline rate: 140.  Variability: moderate (6-25 bpm).   Pattern: no accelerations and no decelerations.    Fetal State Assessment: Category I - tracings are normal.     Physical Exam  Constitutional: She is oriented to person, place, and time. She appears well-developed and well-nourished.       Grimaces w/ ctxs; no moaning   Eyes: Pupils are equal, round, and reactive to light.  Cardiovascular: Normal rate.   Respiratory: Effort normal.  GI: Soft.       gravid  Genitourinary:        Pelvic deferred on admission At office:  7/100/BBOW per Sanda Klein, CNM  Musculoskeletal: She exhibits edema.  Neurological: She is alert and oriented to person, place, and time. She has normal reflexes.  Skin: Skin is warm and dry.  Psychiatric: She has a normal mood and affect. Her behavior is normal. Thought content normal.    Prenatal labs: ABO, Rh: O/POS/-- (04/22 1602) Antibody: NEG (04/22 1602) Rubella: 11.2 (04/22 1602) RPR: NON REAC (07/08 1708)  HBsAg: NEGATIVE (04/22 1602)  HIV: NON REACTIVE (04/22 1602)  GBS: NEGATIVE (09/06 1329)  1hr gtt=110 GC/CT negative on 12/12/11 FFN positive on 10/07/11  Assessment/Plan: 1.  [redacted]w[redacted]d 2. Active labor 3.  GBS neg 4.  Cat I FHT 5.  H/o Vacuum-assisted delivery w/ G1  1.  Admit to [facility] w/ Dr. Estanislado Pandy as attending 2.  Routine L&D orders 3.  Desires epidural 4.  Plan AROM when comfortable s/p epidural 5.  Pitocin/IUPC prn augmentation 6.  C/w MD prn   Dennette Faulconer H 12/29/2011, 1:38 PM

## 2011-12-29 NOTE — Progress Notes (Signed)
Pt c/o feeling uc at 1635 PCEA doses x 2 given.  At 1656 pt c/o "butt feeling numb" and Leg feeling numb"   Explained to pt that in order for epidural to help with pain control she will feel numb

## 2011-12-29 NOTE — Progress Notes (Signed)
RN attempted to coach pt to push.  Pt not certain she can push.  Pt would like epidural turned off.  RN explained to pt that with the epidural off contraction pain would become more intense.  Pt wanting legs to feel less numb.  RN explained to pt that the numbness in legs will take awhile to wear off.  Pt now uncertain if she wants epidural infusion stopped or decreased

## 2011-12-29 NOTE — Progress Notes (Signed)
Placed on  bedpan

## 2011-12-29 NOTE — Anesthesia Preprocedure Evaluation (Signed)

## 2011-12-29 NOTE — Progress Notes (Signed)
H Steelman notified pt feeling urge to push.  RN will check pt and will probably start pushing with pt

## 2011-12-29 NOTE — Progress Notes (Signed)
Requested Allison Mathews to com to room to evaluate  efm

## 2011-12-29 NOTE — Telephone Encounter (Signed)
Pt called, is 39 weeks, states had been having a lot of ctxs last night and could not get any sleep, ctxs were waking pt up out of sleep, says ctxs were 10 min apart, today has been closer to 5 min apart, has had bloody show x 2 wkss, pt also says has not felt baby move, but has not had anything to eat or drink this am.  Pt advised may need to go to MAU, pt asks if she can come into office.  Pt worked in w/ [facility] today, advised to eat and drink before coming in @ 1100 today, pt voices agreement.

## 2011-12-29 NOTE — Progress Notes (Signed)
104w0d PT STATES SHE IS HAVING CONTRACEPTIONS;PT DID NOT TIME. SHE STATES THEY ARE CLOSE TOGETHER. PT STATES SHE FEELS NO MOVEMENT.

## 2011-12-29 NOTE — Anesthesia Procedure Notes (Signed)
Epidural Patient location during procedure: OB  Preanesthetic Checklist Completed: patient identified, site marked, surgical consent, pre-op evaluation, timeout performed, IV checked, risks and benefits discussed and monitors and equipment checked  Epidural Patient position: sitting Prep: site prepped and draped and DuraPrep Patient monitoring: continuous pulse ox and blood pressure Approach: midline Injection technique: LOR air  Needle:  Needle type: Tuohy  Needle gauge: 17 G Needle length: 9 cm and 9 Needle insertion depth: 6 cm Catheter type: closed end flexible Catheter size: 19 Gauge Catheter at skin depth: 12 cm Test dose: negative  Assessment Events: blood not aspirated, injection not painful, no injection resistance, negative IV test and no paresthesia  Additional Notes Dosing of Epidural:  1st dose, through needle ............................................. epi 1:200K + Xylocaine 40 mg  2nd dose, through catheter, after waiting 3 minutes.....epi 1:200K + Xylocaine 40 mg  3rd dose, through catheter after waiting 3 minutes .............................Marcaine   4mg   ( mg Marcaine are expressed as equivilent  cc's medication removed from the 0.1%Bupiv / fentanyl syringe from L&D pump)  ( 2% Xylo charted as a single dose in Epic Meds for ease of charting; actual dosing was fractionated as above, for saftey's sake)  As each dose occurred, patient was free of IV sx; and patient exhibited no evidence of SA injection.  Patient is more comfortable after epidural dosed. Please see RN's note for documentation of vital signs,and FHR which are stable.  Patient reminded not to try to ambulate with numb legs, and that an RN must be present the 1st time she attempts to get up.    

## 2011-12-29 NOTE — Progress Notes (Signed)
Subjective: Comfortable s/p epidural around 1445.  C/o mild HA but is resolving on it's own and declines Tylenol.  Two female visitors at bedside. Objective: BP 100/66  Pulse 72  Temp 98.2 F (36.8 C) (Oral)  Resp 20  Ht 5' (1.524 m)  Wt 152 lb (68.947 kg)  BMI 29.69 kg/m2  SpO2 100%  LMP 03/31/2011      FHT:  FHR: 140 bpm, variability: moderate,  accelerations:  Present,  decelerations:  Absent UC:   regular, every 2-4 minutes SVE:    7/80/-2 w/ BBOW; -1 station after AROM for large amt clear fluid  Labs: Lab Results  Component Value Date   WBC 14.0* 12/29/2011   HGB 13.0 12/29/2011   HCT 36.1 12/29/2011   MCV 85.5 12/29/2011   PLT 170 12/29/2011    Assessment / Plan: 1. 39 weeks 2. spontaneous labor 3. GBS neg   Labor: Progressing normally Preeclampsia:  no signs or symptoms of toxicity Fetal Wellbeing:  Category I Pain Control:  Epidural I/D:  n/a Anticipated MOD:  NSVD 1.  C/w MD prn 2. Pitocin/IUPC prn augmentation 3.  Labor down/push w/ urge  Analiese Krupka H 12/29/2011, 3:37 PM

## 2011-12-30 ENCOUNTER — Encounter: Payer: Medicaid Other | Admitting: Obstetrics and Gynecology

## 2011-12-30 LAB — CBC
HCT: 35 % — ABNORMAL LOW (ref 36.0–46.0)
Hemoglobin: 12.8 g/dL (ref 12.0–15.0)
RDW: 13.6 % (ref 11.5–15.5)
WBC: 15.9 10*3/uL — ABNORMAL HIGH (ref 4.0–10.5)

## 2011-12-30 NOTE — Addendum Note (Signed)
Addendum  created 12/30/11 1302 by Shanon Payor, CRNA   Modules edited:Charges VN, Notes Section

## 2011-12-30 NOTE — Progress Notes (Signed)
UR chart review completed.  

## 2011-12-30 NOTE — Addendum Note (Signed)
Addendum  created 12/30/11 1302 by Renwick Asman M Laker Thompson, CRNA   Modules edited:Charges VN, Notes Section    

## 2011-12-30 NOTE — Anesthesia Postprocedure Evaluation (Signed)
  Anesthesia Post-op Note  Patient: Allison Mathews  Procedure(s) Performed: * No procedures listed *  Patient Location: Mother/Baby  Anesthesia Type: Epidural  Level of Consciousness: awake, alert  and oriented  Airway and Oxygen Therapy: Patient Spontanous Breathing  Post-op Pain: none  Post-op Assessment: Post-op Vital signs reviewed and Patient's Cardiovascular Status Stable  Post-op Vital Signs: Reviewed and stable  Complications: No apparent anesthesia complications

## 2011-12-31 MED ORDER — OXYCODONE-ACETAMINOPHEN 5-325 MG PO TABS: 1.0000 | ORAL_TABLET | ORAL | Status: DC | PRN

## 2011-12-31 MED ORDER — IBUPROFEN 600 MG PO TABS: 600.0000 mg | ORAL_TABLET | Freq: Four times a day (QID) | ORAL | Status: DC

## 2012-01-04 ENCOUNTER — Inpatient Hospital Stay (HOSPITAL_COMMUNITY)
Admission: AD | Admit: 2012-01-04 | Discharge: 2012-01-05 | DRG: 776 | Disposition: A | Discharge status: Hospice | Payer: Medicaid Other | Source: Ambulatory Visit | Attending: Obstetrics and Gynecology | Admitting: Obstetrics and Gynecology

## 2012-01-04 ENCOUNTER — Encounter (HOSPITAL_COMMUNITY): Payer: Self-pay

## 2012-01-04 DIAGNOSIS — R404 Transient alteration of awareness: Secondary | ICD-10-CM

## 2012-01-04 DIAGNOSIS — IMO0002 Reserved for concepts with insufficient information to code with codable children: Principal | ICD-10-CM

## 2012-01-04 DIAGNOSIS — O1495 Unspecified pre-eclampsia, complicating the puerperium: Secondary | ICD-10-CM

## 2012-01-04 DIAGNOSIS — O2650 Maternal hypotension syndrome, unspecified trimester: Secondary | ICD-10-CM | POA: Diagnosis present

## 2012-01-04 DIAGNOSIS — M6281 Muscle weakness (generalized): Secondary | ICD-10-CM

## 2012-01-04 DIAGNOSIS — R200 Anesthesia of skin: Secondary | ICD-10-CM | POA: Diagnosis present

## 2012-01-04 DIAGNOSIS — IMO0001 Reserved for inherently not codable concepts without codable children: Secondary | ICD-10-CM

## 2012-01-04 DIAGNOSIS — R5381 Other malaise: Secondary | ICD-10-CM | POA: Diagnosis present

## 2012-01-04 DIAGNOSIS — E876 Hypokalemia: Secondary | ICD-10-CM | POA: Diagnosis present

## 2012-01-04 DIAGNOSIS — R945 Abnormal results of liver function studies: Secondary | ICD-10-CM

## 2012-01-04 DIAGNOSIS — R209 Unspecified disturbances of skin sensation: Secondary | ICD-10-CM | POA: Diagnosis present

## 2012-01-04 LAB — COMPREHENSIVE METABOLIC PANEL
ALT: 64 U/L — ABNORMAL HIGH (ref 0–35)
ALT: 64 U/L — ABNORMAL HIGH (ref 0–35)
Albumin: 3.1 g/dL — ABNORMAL LOW (ref 3.5–5.2)
Alkaline Phosphatase: 90 U/L (ref 39–117)
Alkaline Phosphatase: 97 U/L (ref 39–117)
GFR calc Af Amer: >90 mL/min (ref >90)
Glucose, Bld: 139 mg/dL — ABNORMAL HIGH (ref 70–99)
Glucose, Bld: 90 mg/dL (ref 70–99)
Potassium: 3.3 mEq/L — ABNORMAL LOW (ref 3.5–5.1)
Potassium: 3.7 mEq/L (ref 3.5–5.1)
Sodium: 139 mEq/L (ref 135–145)
Sodium: 137 mEq/L (ref 135–145)
Total Protein: 5.8 g/dL — ABNORMAL LOW (ref 6.0–8.3)
Total Protein: 5.9 g/dL — ABNORMAL LOW (ref 6.0–8.3)

## 2012-01-04 LAB — CBC WITH DIFFERENTIAL/PLATELET
Basophils Absolute: 0 10*3/uL (ref 0.0–0.1)
Basophils Relative: 0 % (ref 0–1)
Eosinophils Absolute: 0.2 10*3/uL (ref 0.0–0.7)
Eosinophils Relative: 2 % (ref 0–5)
HCT: 35.4 % — ABNORMAL LOW (ref 36.0–46.0)
MCH: 31.1 pg (ref 26.0–34.0)
MCHC: 36.7 g/dL — ABNORMAL HIGH (ref 30.0–36.0)
MCV: 84.7 fL (ref 78.0–100.0)
Monocytes Absolute: 0.8 10*3/uL (ref 0.1–1.0)
Neutro Abs: 8.3 10*3/uL — ABNORMAL HIGH (ref 1.7–7.7)
RDW: 13.2 % (ref 11.5–15.5)

## 2012-01-04 LAB — RAPID URINE DRUG SCREEN, HOSP PERFORMED
Opiates: NOT DETECTED
Tetrahydrocannabinol: NOT DETECTED

## 2012-01-04 LAB — URINALYSIS, ROUTINE W REFLEX MICROSCOPIC
Glucose, UA: NEGATIVE mg/dL
Hgb urine dipstick: NEGATIVE
Specific Gravity, Urine: 1.01 (ref 1.005–1.030)
Urobilinogen, UA: 0.2 mg/dL (ref 0.0–1.0)
pH: 7.5 (ref 5.0–8.0)

## 2012-01-04 MED ORDER — SODIUM CHLORIDE 0.9 % IJ SOLN
3.0000 mL | INTRAMUSCULAR | Status: DC | PRN
  Administered 2012-01-04: 3 mL via INTRAVENOUS

## 2012-01-04 MED ORDER — MAGNESIUM SULFATE 40 G IN LACTATED RINGERS - SIMPLE
2.0000 g/h | INTRAVENOUS | Status: DC
  Administered 2012-01-04: 2 g/h via INTRAVENOUS
  Filled 2012-01-04: qty 500

## 2012-01-04 MED ORDER — LACTATED RINGERS IV SOLN
INTRAVENOUS | Status: DC
  Administered 2012-01-04 (×2): via INTRAVENOUS

## 2012-01-04 MED ORDER — SODIUM CHLORIDE 0.9 % IJ SOLN
3.0000 mL | Freq: Two times a day (BID) | INTRAMUSCULAR | Status: DC
  Administered 2012-01-04 – 2012-01-05 (×2): 3 mL via INTRAVENOUS

## 2012-01-04 MED ORDER — IBUPROFEN 600 MG PO TABS
600.0000 mg | ORAL_TABLET | Freq: Four times a day (QID) | ORAL | Status: DC | PRN
  Administered 2012-01-04: 600 mg via ORAL
  Filled 2012-01-04: qty 1

## 2012-01-04 MED ORDER — MAGNESIUM SULFATE BOLUS VIA INFUSION
4.0000 g | Freq: Once | INTRAVENOUS | Status: AC
  Administered 2012-01-04: 4 g via INTRAVENOUS
  Filled 2012-01-04: qty 500

## 2012-01-04 MED ORDER — SODIUM CHLORIDE 0.9 % IV SOLN: 250.0000 mL | INTRAVENOUS | Status: DC | PRN

## 2012-01-04 MED ORDER — OXYCODONE-ACETAMINOPHEN 5-325 MG PO TABS
1.0000 | ORAL_TABLET | ORAL | Status: DC | PRN
  Administered 2012-01-04 – 2012-01-05 (×3): 1 via ORAL
  Filled 2012-01-04 (×3): qty 1

## 2012-01-04 NOTE — Progress Notes (Addendum)
Lactation Consultation Note  Patient Name: Allison Mathews [id number] Date: 01/04/2012     Maternal Data                   Lactation Tools Discussed/Used    Consult Status    Patient has a 6 day old baby at home that she has been breastfeeding every 1-2 hours.  She has not been able to feed him in several hours because they are separated.  Her breasts are full and uncomfortable.  She is sleepy from Magnesium Sulfate so this LC set up a double electric pump for her and helped her to express 3 ounces of milk.  She mentioned that she has sharp shooting pain in the outer quadrants of the left breast.  There are palpable ducts that may not be emptying sufficiently at this time.  Expect that they will empty over the next several hours with pumping and massage.  This LC spoke with her RN and recommended ibuprofen to help with any inflammation that may be contributing to the ducts not draining.  She offered to call Dr. Pennie Rushing for an ibuprofen order. Mother aware that she needs to pump every 2-3 hours to maintain her MS.  Flange size increased to a "30.  Follow-up tomorrow.  Allison Mathews 01/04/2012, 2:36 PM

## 2012-01-04 NOTE — Progress Notes (Signed)
Dr Pennie Rushing on unit to assess pt. Breast pump ordered and Neurology consult.

## 2012-01-04 NOTE — MAU Note (Signed)
Pt states x2 days legs go numb after breastfeeding. States fell on the floor about an hour ago b/c body went numb and felt heavy.

## 2012-01-04 NOTE — Progress Notes (Signed)
1319 Neurologist at bedside to assess pt.

## 2012-01-04 NOTE — Progress Notes (Signed)
History  Allison Mathews is a 21 y.o.  Mother and sister at bedside Baby is feeding every 1-2 hours and sometimes q 20 min L breast with shooting pains at times. Breastfeeding, my R leg goes numb when I breastfeed x 2 days, yesterday driving and L leg numb too, I barely made it home, hadn't eaten all day until before I fell, ate waffles, fells softly to the floor did not go unconscious, did not have a headache had some spots and blurring to eyes then, I feel numb now but can feel you touch. Took percocet 3 hours ago taking motrin. Lewis is home and saw me fall. I peed when I got here and had a bm but did not with falling.  Denies recent headaches or visual problems.    Chief Complaint  Patient presents with  . Numbness   @SFHPI @  Prior to Admission medications   Medication Sig Start Date End Date Taking? Authorizing Provider  ibuprofen (ADVIL,MOTRIN) 600 MG tablet Take 1 tablet (600 mg total) by mouth every 6 (six) hours. 12/31/11  Yes Malissa Hippo, CNM  loratadine (CLARITIN) 10 MG tablet Take 10 mg by mouth daily as needed. For allergies   Yes Historical Provider, MD  oxyCODONE-acetaminophen (PERCOCET/ROXICET) 5-325 MG per tablet Take 1-2 tablets by mouth every 4 (four) hours as needed (moderate - severe pain). 12/31/11  Yes Malissa Hippo, CNM  Prenatal Vit-Fe Fumarate-FA (PRENATAL MULTIVITAMIN) TABS Take 1 tablet by mouth daily.   Yes Historical Provider, MD    Patient Active Problem List  Diagnosis  . Nausea & vomiting  . Diarrhea  . Scoliosis  . Pregnant state, incidental  . Pollen allergies   Vitals:  Blood pressure 138/69, pulse 60, temperature 99.4 F (37.4 C), temperature source Oral, resp. rate 20, last menstrual period 03/31/2011, SpO2 100.00%, currently breastfeeding. OB History    Grav Para Term Preterm Abortions TAB SAB Ect Mult Living   3 2 2  1  1   2       Past Medical History  Diagnosis Date  . Heart murmur   . Sickle cell trait   . Anemia     FeSO4  taken  . Scoliosis   . Yeast infection     NOT FREQUENT  . Active labor 12/29/2011  . NSVD (normal spontaneous vaginal delivery) 12/29/2011    Past Surgical History  Procedure Date  . No past surgeries     Family History  Problem Relation Age of Onset  . Anesthesia problems Neg Hx   . Hypotension Neg Hx   . Malignant hyperthermia Neg Hx   . Pseudochol deficiency Neg Hx   . Diabetes Father   . Hypertension Paternal Aunt   . Diabetes Paternal Grandmother     History  Substance Use Topics  . Smoking status: Never Smoker   . Smokeless tobacco: Never Used  . Alcohol Use: No    Allergies: No Known Allergies  Prescriptions prior to admission  Medication Sig Dispense Refill  . ibuprofen (ADVIL,MOTRIN) 600 MG tablet Take 1 tablet (600 mg total) by mouth every 6 (six) hours.  30 tablet  2  . loratadine (CLARITIN) 10 MG tablet Take 10 mg by mouth daily as needed. For allergies      . oxyCODONE-acetaminophen (PERCOCET/ROXICET) 5-325 MG per tablet Take 1-2 tablets by mouth every 4 (four) hours as needed (moderate - severe pain).  30 tablet  0  . Prenatal Vit-Fe Fumarate-FA (PRENATAL MULTIVITAMIN) TABS Take 1 tablet  by mouth daily.        @ROS @ Physical Exam   Blood pressure 138/69, pulse 60, temperature 99.4 F (37.4 C), temperature source Oral, resp. rate 20, last menstrual period 03/31/2011, SpO2 100.00%, currently breastfeeding.  @[email or web address] @ Labs:  Recent Results (from the past 24 hour(s))  GLUCOSE, CAPILLARY   Collection Time   01/04/12  2:42 AM      Component Value Range   Glucose-Capillary 151 (*) 70 - 99 mg/dL   ASSESSMENT: Patient Active Problem List  Diagnosis  . Nausea & vomiting  . Diarrhea  . Scoliosis  . Pregnant state, incidental  . Pollen allergies   Physical Examination:  General appearance cheerful and talkative. Physical exam: Calm, no distress, talkative giggling at times, lungs clear bilaterally, AP RRR, breasts soft, nt, abd soft,  nt, bowel  sounds active, Fundal height U -2, no flow DTR +3 no clonus No edema to lower extremities, able to bring knees up while lying in bed, able to lift feet 3 inches off bed to flex and extend feet, to wiggle toes.  Walked in bathroom when up with 2 nurses with steady gait, transferred to room by "steady" took 2 steps back to edge of bed with difficulty Unable to raise knees up when asked or move arms, did bring knees up while repositioning to pull self up in bed able to bend knees to reposition to side. Reaches uo with both arms for paper towel and rubs blood off fingers and wads up towel  HGB 12.8 HCT 35 on 9/24 CBG 151 on admission  ED Course  Assessment/Plan 6 days postpartum Cbc, cmp, ua, rds, bedrest, serial bp. Collaboration with Dr. Pennie Rushing per telephone at 0300. Lavera Guise, CNM

## 2012-01-04 NOTE — Progress Notes (Signed)
Post Partum Day 6 Subjective: c/o headache without visual changes.  No upper abdominal pain  Objective: Blood pressure 134/70, pulse 59, temperature 98.6 F (37 C), temperature source Oral, resp. rate 16, height 5' (1.524 m), weight 141 lb 0.8 oz (63.98 kg), last menstrual period 03/31/2011, SpO2 99.00%, currently breastfeeding.  Physical Exam:  Results for orders placed during the hospital encounter of 01/04/12 (from the past 24 hour(s))  GLUCOSE, CAPILLARY     Status: Abnormal   Collection Time   01/04/12  2:42 AM      Component Value Range   Glucose-Capillary 151 (*) 70 - 99 mg/dL  COMPREHENSIVE METABOLIC PANEL     Status: Abnormal   Collection Time   01/04/12  2:54 AM      Component Value Range   Sodium 139  135 - 145 mEq/L   Potassium 3.3 (*) 3.5 - 5.1 mEq/L   Chloride 103  96 - 112 mEq/L   CO2 25  19 - 32 mEq/L   Glucose, Bld 139 (*) 70 - 99 mg/dL   BUN 7  6 - 23 mg/dL   Creatinine, Ser 1.61  0.50 - 1.10 mg/dL   Calcium 9.3  8.4 - 09.6 mg/dL   Total Protein 5.8 (*) 6.0 - 8.3 g/dL   Albumin 3.0 (*) 3.5 - 5.2 g/dL   AST 43 (*) 0 - 37 U/L   ALT 64 (*) 0 - 35 U/L   Alkaline Phosphatase 90  39 - 117 U/L   Total Bilirubin 0.1 (*) 0.3 - 1.2 mg/dL   GFR calc non Af Amer >90  >90 mL/min   GFR calc Af Amer >90  >90 mL/min  CBC WITH DIFFERENTIAL     Status: Abnormal   Collection Time   01/04/12  2:54 AM      Component Value Range   WBC 11.2 (*) 4.0 - 10.5 K/uL   RBC 4.18  3.87 - 5.11 MIL/uL   Hemoglobin 13.0  12.0 - 15.0 g/dL   HCT 04.5 (*) 40.9 - 81.1 %   MCV 84.7  78.0 - 100.0 fL   MCH 31.1  26.0 - 34.0 pg   MCHC 36.7 (*) 30.0 - 36.0 g/dL   RDW 91.4  78.2 - 95.6 %   Platelets 227  150 - 400 K/uL   Neutrophils Relative 74  43 - 77 %   Neutro Abs 8.3 (*) 1.7 - 7.7 K/uL   Lymphocytes Relative 17  12 - 46 %   Lymphs Abs 1.9  0.7 - 4.0 K/uL   Monocytes Relative 7  3 - 12 %   Monocytes Absolute 0.8  0.1 - 1.0 K/uL   Eosinophils Relative 2  0 - 5 %   Eosinophils Absolute 0.2   0.0 - 0.7 K/uL   Basophils Relative 0  0 - 1 %   Basophils Absolute 0.0  0.0 - 0.1 K/uL  URINALYSIS, ROUTINE W REFLEX MICROSCOPIC     Status: Normal   Collection Time   01/04/12  3:32 AM      Component Value Range   Color, Urine YELLOW  YELLOW   APPearance CLEAR  CLEAR   Specific Gravity, Urine 1.010  1.005 - 1.030   pH 7.5  5.0 - 8.0   Glucose, UA NEGATIVE  NEGATIVE mg/dL   Hgb urine dipstick NEGATIVE  NEGATIVE   Bilirubin Urine NEGATIVE  NEGATIVE   Ketones, ur NEGATIVE  NEGATIVE mg/dL   Protein, ur NEGATIVE  NEGATIVE mg/dL   Urobilinogen,  UA 0.2  0.0 - 1.0 mg/dL   Nitrite NEGATIVE  NEGATIVE   Leukocytes, UA NEGATIVE  NEGATIVE  URINE RAPID DRUG SCREEN (HOSP PERFORMED)     Status: Normal   Collection Time   01/04/12  3:33 AM      Component Value Range   Opiates NONE DETECTED  NONE DETECTED   Cocaine NONE DETECTED  NONE DETECTED   Benzodiazepines NONE DETECTED  NONE DETECTED   Amphetamines NONE DETECTED  NONE DETECTED   Tetrahydrocannabinol NONE DETECTED  NONE DETECTED   Barbiturates NONE DETECTED  NONE DETECTED  MRSA PCR SCREENING     Status: Normal   Collection Time   01/04/12  6:00 AM      Component Value Range   MRSA by PCR NEGATIVE  NEGATIVE  COMPREHENSIVE METABOLIC PANEL     Status: Abnormal   Collection Time   01/04/12 11:35 AM      Component Value Range   Sodium 137  135 - 145 mEq/L   Potassium 3.7  3.5 - 5.1 mEq/L   Chloride 102  96 - 112 mEq/L   CO2 26  19 - 32 mEq/L   Glucose, Bld 90  70 - 99 mg/dL   BUN 5 (*) 6 - 23 mg/dL   Creatinine, Ser 0.34  0.50 - 1.10 mg/dL   Calcium 8.3 (*) 8.4 - 10.5 mg/dL   Total Protein 5.9 (*) 6.0 - 8.3 g/dL   Albumin 3.1 (*) 3.5 - 5.2 g/dL   AST 40 (*) 0 - 37 U/L   ALT 64 (*) 0 - 35 U/L   Alkaline Phosphatase 97  39 - 117 U/L   Total Bilirubin 0.2 (*) 0.3 - 1.2 mg/dL   GFR calc non Af Amer >90  >90 mL/min   GFR calc Af Amer >90  >90 mL/min  MAGNESIUM     Status: Abnormal   Collection Time   01/04/12 11:35 AM      Component  Value Range   Magnesium 4.1 (*) 1.5 - 2.5 mg/dL     Basename 74/25/95 6387  HGB 13.0  HCT 35.4*  Neurology  Consult completed without focal neuro findings and no specific recommendations  Assessment/Plan: Presyncopal episode and lower extremity weakness without focal findings Awaiting completion of 24 hr urine After discussion with neurology, will defer any further magnesium treatment until clear diagnosis of pre-eclampsia is noted.   LOS: 0 days   Amaar Oshita P 01/04/2012, 4:49 PM

## 2012-01-04 NOTE — Consult Note (Addendum)
Reason for Consult:.  Generalized weakness Referring Physician:Dr. Dierdre Forth Date of Consult  CC: Generalized weakness post partum day 6 HPI: This is a 21 y/o RHAAF post partum day 6 who was preeclamptic and after delivery stabilized and discharged home. Patient also ahd a prolong epidural during L and D. Patient has been feeling weak and had NEAR SYNCOPAL, but no loss of consciousness, she felt light headed and dizzy but did not lose consciousness, she denies seizure like activity,no nausea, no vomiting, no headaches, no diplopia, no sob, no chest pain, no anxiety, no palpitations, no bladder or bowel incontinent.   She can recall the whole entire event of light headedness. She has been complaining of back pain with some lower extremity weakness Neurology was consulted for the above complaints.  Patient was also started on IV magnesium this morning but had been d/c prior to my examination  Past Medical History  Diagnosis Date  . Heart murmur   . Sickle cell trait   . Anemia     FeSO4 taken  . Scoliosis   . Yeast infection     NOT FREQUENT  . Active labor 12/29/2011  . NSVD (normal spontaneous vaginal delivery) 12/29/2011    Past Surgical History  Procedure Date  . No past surgeries     Family History  Problem Relation Age of Onset  . Anesthesia problems Neg Hx   . Hypotension Neg Hx   . Malignant hyperthermia Neg Hx   . Pseudochol deficiency Neg Hx   . Diabetes Father   . Hypertension Paternal Aunt   . Diabetes Paternal Grandmother     Social History:  reports that she has never smoked. She has never used smokeless tobacco. She reports that she does not drink alcohol or use illicit drugs.  No Known Allergies  Medications:  Scheduled:   . magnesium  4 g Intravenous Once    ROS: All the 12 point review of systems were reviewed and pertinent are mentioned in the HPI  Physical Examination: Blood pressure 125/72, pulse 76, temperature 99.2 F (37.3 C), temperature  source Oral, resp. rate 20, height 5' (1.524 m), weight 63.98 kg (141 lb 0.8 oz), last menstrual period 03/31/2011, SpO2 98.00%, currently breastfeeding.   General: She is awake, alert and follows commands.  NAD, watching TV  HEENT; EOMI, PEERLA, no nystagmus, no ptosis, no lid lag, no diplopia, no convergence or divergence palsy Neck: Supple, no JVD, no carotid bruit Lungs:CTA Heart: RRR Abdomen: Bowel sounds present Skin: no rash Extremities: NO edema  Neurologic Examination Mental Status: Alert, oriented, thought content appropriate.  Speech fluent without evidence of aphasia.  Able to follow 3 step commands without difficulty. Cranial Nerves: II: visual fields grossly normal, pupils equal, round, reactive to light and accommodation III,IV, VI: ptosis not present, extra-ocular motions intact bilaterally V,VII: smile symmetric, facial light touch sensation normal bilaterally VIII: hearing normal bilaterally IX,X: gag reflex present XI: trapezius strength/neck flexion strength normal bilaterally XII: tongue strength normal  Motor: Right : Upper extremity   5/5    Left:     Upper extremity   4+/5  Lower extremity   4+/5     Lower extremity   4+ /5 Tone and bulk:normal tone throughout; no atrophy noted Sensory: Pinprick and light touch, temperature and vibration intact throughout, bilaterally Deep Tendon Reflexes: 3+ and symmetric throughout  Brisk throughout, Plantars: Right: downgoing   Left: downgoing No clonus Cerebellar: normal finger-to-nose, normal rapid  Gait: patient mentioned she cannot ambulate  No spinal tenderness and or spinal sensory level   Laboratory Studies:   Basic Metabolic Panel:  Lab 01/04/12 5409 01/04/12 0254  NA 137 139  K 3.7 3.3*  CL 102 103  CO2 26 25  GLUCOSE 90 139*  BUN 5* 7  CREATININE 0.64 0.53  CALCIUM 8.3* 9.3  MG 4.1* --  PHOS -- --    Liver Function Tests:  Lab 01/04/12 1135 01/04/12 0254  AST 40* 43*  ALT 64* 64*  ALKPHOS  97 90  BILITOT 0.2* 0.1*  PROT 5.9* 5.8*  ALBUMIN 3.1* 3.0*   No results found for this basename: LIPASE:5,AMYLASE:5 in the last 168 hours No results found for this basename: AMMONIA:3 in the last 168 hours  CBC:  Lab 01/04/12 0254 12/30/11 0505 12/29/11 1324  WBC 11.2* 15.9* 14.0*  NEUTROABS 8.3* -- --  HGB 13.0 12.8 13.0  HCT 35.4* 35.0* 36.1  MCV 84.7 84.5 85.5  PLT 227 167 170    Cardiac Enzymes: No results found for this basename: CKTOTAL:5,CKMB:5,CKMBINDEX:5,TROPONINI:5 in the last 168 hours  BNP: No components found with this basename: POCBNP:5  CBG:  Lab 01/04/12 0242  GLUCAP 151*    Microbiology: Results for orders placed during the hospital encounter of 01/04/12  MRSA PCR SCREENING     Status: Normal   Collection Time   01/04/12  6:00 AM      Component Value Range Status Comment   MRSA by PCR NEGATIVE  NEGATIVE Final     Coagulation Studies: No results found for this basename: LABPROT:5,INR:5 in the last 72 hours  Urinalysis:  Lab 01/04/12 0332  COLORURINE YELLOW  LABSPEC 1.010  PHURINE 7.5  GLUCOSEU NEGATIVE  HGBUR NEGATIVE  BILIRUBINUR NEGATIVE  KETONESUR NEGATIVE  PROTEINUR NEGATIVE  UROBILINOGEN 0.2  NITRITE NEGATIVE  LEUKOCYTESUR NEGATIVE    Lipid Panel:  No results found for this basename: chol, trig, hdl, cholhdl, vldl, ldlcalc    HgbA1C:  No results found for this basename: HGBA1C    Urine Drug Screen:     Component Value Date/Time   LABOPIA NONE DETECTED 01/04/2012 0333   COCAINSCRNUR NONE DETECTED 01/04/2012 0333   LABBENZ NONE DETECTED 01/04/2012 0333   AMPHETMU NONE DETECTED 01/04/2012 0333   THCU NONE DETECTED 01/04/2012 0333   LABBARB NONE DETECTED 01/04/2012 0333    Alcohol Level: No results found for this basename: ETH:2 in the last 168 hours  Other results: EKG: normal EKG, normal sinus rhythm, unchanged from previous tracings.    Assessment/Plan: 21 y/o with preclampsia,  Post partum day 6 now with generalized  weakness. No sensory or reflex defects Patient complaints of back pain, and leg weakness, however, she also is making very poor effort  Differential Diagnosis: 1) Post Epidural  2) Radiculopathy 3) Anesthesia effect  Recommendations: 1) Pain management 2) Patient may benefit from 10 mg of prednisone for 5 days 3) Outpatient EMG and NCS 4) If okay with OBGYN physician.  Patient can have 10 mg of prednisone for 5 days  Graziella Connery V-P Eilleen Kempf., MD., Ph.D.,MS 01/04/2012 2:47 PM

## 2012-01-04 NOTE — Progress Notes (Signed)
Patient ID: Allison Mathews, female   DOB: March 11, 1991, 21 y.o.   MRN: [id number] Subjective: Patient reports no nausea, vomiting and tolerating PO.  She denies headache or visual symptoms. She continues to complain of numbness in her feet and legs, and now complaining that this symptom is bilateral. She views in consistent in her history of gout when these leg symptoms began. She states that they started when her epidural was being put in at one time and then states later that it was only after her delivery. So the onset of symptoms is not at all clear. She also complains of upper back pain but no chest pain. She does have a history of scoliosis. She is now complaining of some generalized weakness she is approximately 4 hours into a magnesium infusion, given preventatively for the possibility of preeclampsia heralded by increases in blood pressure and elevated liver enzymes appear she denies specific abdominal pain or she has a Foley catheter in place.  Objective: I have reviewed patient's vital signs, intake and output, medications and labs.  General: alert, appears stated age, distracted, fatigued, mild distress and slowed mentation.  She seems almost distracted Resp: clear to auscultation bilaterally Cardio: regular rate and rhythm, S1, S2 normal, no murmur, click, rub or gallop GI: soft, non-tender; bowel sounds normal; no masses,  no organomegaly Extremities: extremities normal, atraumatic, no cyanosis or edema Vaginal Bleeding: Normal lochia  I/O last 3 completed shifts: In: 212.5 [I.V.:212.5] Out: 200 [Urine:200] Total I/O In: 375 [I.V.:375] Out: 1200 [Urine:1200]  Results for orders placed during the hospital encounter of 01/04/12 (from the past 24 hour(s))  GLUCOSE, CAPILLARY     Status: Abnormal   Collection Time   01/04/12  2:42 AM      Component Value Range   Glucose-Capillary 151 (*) 70 - 99 mg/dL  COMPREHENSIVE METABOLIC PANEL     Status: Abnormal   Collection Time   01/04/12  2:54 AM      Component Value Range   Sodium 139  135 - 145 mEq/L   Potassium 3.3 (*) 3.5 - 5.1 mEq/L   Chloride 103  96 - 112 mEq/L   CO2 25  19 - 32 mEq/L   Glucose, Bld 139 (*) 70 - 99 mg/dL   BUN 7  6 - 23 mg/dL   Creatinine, Ser 4.09  0.50 - 1.10 mg/dL   Calcium 9.3  8.4 - 81.1 mg/dL   Total Protein 5.8 (*) 6.0 - 8.3 g/dL   Albumin 3.0 (*) 3.5 - 5.2 g/dL   AST 43 (*) 0 - 37 U/L   ALT 64 (*) 0 - 35 U/L   Alkaline Phosphatase 90  39 - 117 U/L   Total Bilirubin 0.1 (*) 0.3 - 1.2 mg/dL   GFR calc non Af Amer >90  >90 mL/min   GFR calc Af Amer >90  >90 mL/min  CBC WITH DIFFERENTIAL     Status: Abnormal   Collection Time   01/04/12  2:54 AM      Component Value Range   WBC 11.2 (*) 4.0 - 10.5 K/uL   RBC 4.18  3.87 - 5.11 MIL/uL   Hemoglobin 13.0  12.0 - 15.0 g/dL   HCT 91.4 (*) 78.2 - 95.6 %   MCV 84.7  78.0 - 100.0 fL   MCH 31.1  26.0 - 34.0 pg   MCHC 36.7 (*) 30.0 - 36.0 g/dL   RDW 21.3  08.6 - 57.8 %   Platelets 227  150 -  400 K/uL   Neutrophils Relative 74  43 - 77 %   Neutro Abs 8.3 (*) 1.7 - 7.7 K/uL   Lymphocytes Relative 17  12 - 46 %   Lymphs Abs 1.9  0.7 - 4.0 K/uL   Monocytes Relative 7  3 - 12 %   Monocytes Absolute 0.8  0.1 - 1.0 K/uL   Eosinophils Relative 2  0 - 5 %   Eosinophils Absolute 0.2  0.0 - 0.7 K/uL   Basophils Relative 0  0 - 1 %   Basophils Absolute 0.0  0.0 - 0.1 K/uL  URINALYSIS, ROUTINE W REFLEX MICROSCOPIC     Status: Normal   Collection Time   01/04/12  3:32 AM      Component Value Range   Color, Urine YELLOW  YELLOW   APPearance CLEAR  CLEAR   Specific Gravity, Urine 1.010  1.005 - 1.030   pH 7.5  5.0 - 8.0   Glucose, UA NEGATIVE  NEGATIVE mg/dL   Hgb urine dipstick NEGATIVE  NEGATIVE   Bilirubin Urine NEGATIVE  NEGATIVE   Ketones, ur NEGATIVE  NEGATIVE mg/dL   Protein, ur NEGATIVE  NEGATIVE mg/dL   Urobilinogen, UA 0.2  0.0 - 1.0 mg/dL   Nitrite NEGATIVE  NEGATIVE   Leukocytes, UA NEGATIVE  NEGATIVE  URINE RAPID DRUG  SCREEN (HOSP PERFORMED)     Status: Normal   Collection Time   01/04/12  3:33 AM      Component Value Range   Opiates NONE DETECTED  NONE DETECTED   Cocaine NONE DETECTED  NONE DETECTED   Benzodiazepines NONE DETECTED  NONE DETECTED   Amphetamines NONE DETECTED  NONE DETECTED   Tetrahydrocannabinol NONE DETECTED  NONE DETECTED   Barbiturates NONE DETECTED  NONE DETECTED  MRSA PCR SCREENING     Status: Normal   Collection Time   01/04/12  6:00 AM      Component Value Range   MRSA by PCR NEGATIVE  NEGATIVE    Assessment/Plan: 6 days post vaginal delivery under epidural anesthesia Subjective bilateral lower extremitiy weakness and numbness Subjective upper extremity weakness Presyncopal episode Variable BPs with some elevated;  and mild liver enzyme elevation.  R/o pre-eclampsia  Plan: 24 hr urine protein Magnesium started initially until 24 hr urine results available, but will d/c to allow neurological evaluation Neurology consult Anesthesia consult Repeat CMP Magnesium level     LOS: 0 days    Abir Craine P 01/04/2012, 11:14 AM

## 2012-01-04 NOTE — Consults (Signed)
CC: Numbness and weakness in buttocks  HPI:  S/P epidural anesthesia on 9/23 patient complains of new numbness in her left buttocks.  She states that earlier she fell down because of weakness but did not lose consciousness.  She does not complain of pain.  She notes that the numbness in her left buttocks gets worse with sitting for prolonged periods of time.  Yesterday driving her car she noticed some right-sided numbness in her buttocks after sitting for a prolonged time which progressed to the point that she had difficulty placing pressure on the brakes.  The transient right-sided numbness resolved immediately after getting out of the car.  Of note, during her epidural anesthetic she complained of "being too numb".  She states that her lower extremity numbness resolved after her epidural was discontinued, but that 2 days later it returned abruptly only in the area of her right buttocks during breast-feeding.  Upon in-depth questioning, it is difficult to discern the nature, frequency, or the linear progression of her symptoms.  She does not complain of fever and the site is not unnaturally tender.  Today, I saw the patient in the ICU.   She is being treated with Magnesium for elevated liver enzymes and preeclampsia so this may have effected her history and physical exam.  ROS:  A twelve system review was performed and was negative other than the above mentioned items.  Physical Exam:  VITAL: Afebrile Vital Signs stable SITE: clean and non-erythematous LUNGS: clear to ascultation bilaterally HEART: regular rate and rhythm  Assessment and Plan:  The patient's symptoms are nonspecific and are common after pregnancy and childbirth.  I have spoken with the patient and discussed her care plan.  Currently we are awaiting a neurology consult. If the patient's symptoms worsen or include and of the following:  new pain radiating down the legs,  bowel  or bladder dysfunction, worsening numbness, motor weakness,  severe pain at the insertion site, or fever,  we would recommend her report to the emergency room and urgent consult with a neurologist, neurosurgeon, or orthopedic surgeon.  Thank you for the call.  If any further questions arise please call [phone].

## 2012-01-04 NOTE — Progress Notes (Signed)
Dr Pennie Rushing continued on unit with Neurology consult ordered, and magnesium d/c'd.  Will continue to monitor closely.

## 2012-01-04 NOTE — Progress Notes (Signed)
Results for orders placed during the hospital encounter of 01/04/12 (from the past 24 hour(s))  GLUCOSE, CAPILLARY     Status: Abnormal   Collection Time   01/04/12  2:42 AM      Component Value Range   Glucose-Capillary 151 (*) 70 - 99 mg/dL  COMPREHENSIVE METABOLIC PANEL     Status: Abnormal   Collection Time   01/04/12  2:54 AM      Component Value Range   Sodium 139  135 - 145 mEq/L   Potassium 3.3 (*) 3.5 - 5.1 mEq/L   Chloride 103  96 - 112 mEq/L   CO2 25  19 - 32 mEq/L   Glucose, Bld 139 (*) 70 - 99 mg/dL   BUN 7  6 - 23 mg/dL   Creatinine, Ser 4.78  0.50 - 1.10 mg/dL   Calcium 9.3  8.4 - 29.5 mg/dL   Total Protein 5.8 (*) 6.0 - 8.3 g/dL   Albumin 3.0 (*) 3.5 - 5.2 g/dL   AST 43 (*) 0 - 37 U/L   ALT 64 (*) 0 - 35 U/L   Alkaline Phosphatase 90  39 - 117 U/L   Total Bilirubin 0.1 (*) 0.3 - 1.2 mg/dL   GFR calc non Af Amer >90  >90 mL/min   GFR calc Af Amer >90  >90 mL/min  CBC WITH DIFFERENTIAL     Status: Abnormal   Collection Time   01/04/12  2:54 AM      Component Value Range   WBC 11.2 (*) 4.0 - 10.5 K/uL   RBC 4.18  3.87 - 5.11 MIL/uL   Hemoglobin 13.0  12.0 - 15.0 g/dL   HCT 62.1 (*) 30.8 - 65.7 %   MCV 84.7  78.0 - 100.0 fL   MCH 31.1  26.0 - 34.0 pg   MCHC 36.7 (*) 30.0 - 36.0 g/dL   RDW 84.6  96.2 - 95.2 %   Platelets 227  150 - 400 K/uL   Neutrophils Relative 74  43 - 77 %   Neutro Abs 8.3 (*) 1.7 - 7.7 K/uL   Lymphocytes Relative 17  12 - 46 %   Lymphs Abs 1.9  0.7 - 4.0 K/uL   Monocytes Relative 7  3 - 12 %   Monocytes Absolute 0.8  0.1 - 1.0 K/uL   Eosinophils Relative 2  0 - 5 %   Eosinophils Absolute 0.2  0.0 - 0.7 K/uL   Basophils Relative 0  0 - 1 %   Basophils Absolute 0.0  0.0 - 0.1 K/uL  URINALYSIS, ROUTINE W REFLEX MICROSCOPIC     Status: Normal   Collection Time   01/04/12  3:32 AM      Component Value Range   Color, Urine YELLOW  YELLOW   APPearance CLEAR  CLEAR   Specific Gravity, Urine 1.010  1.005 - 1.030   pH 7.5  5.0 - 8.0   Glucose, UA NEGATIVE  NEGATIVE mg/dL   Hgb urine dipstick NEGATIVE  NEGATIVE   Bilirubin Urine NEGATIVE  NEGATIVE   Ketones, ur NEGATIVE  NEGATIVE mg/dL   Protein, ur NEGATIVE  NEGATIVE mg/dL   Urobilinogen, UA 0.2  0.0 - 1.0 mg/dL   Nitrite NEGATIVE  NEGATIVE   Leukocytes, UA NEGATIVE  NEGATIVE   Patient Vitals for the past 24 hrs:  BP Temp Temp src Pulse Resp SpO2  01/04/12 0348 137/71 mmHg - - 74  - -  01/04/12 0333 115/55 mmHg - - 56  - -  01/04/12 0318 151/78 mmHg - - 57  - -  01/04/12 0303 130/77 mmHg - - 61  - -  01/04/12 0259 149/76 mmHg - - 57  - -  01/04/12 0256 - - - 60  - 100 %  01/04/12 0232 138/69 mmHg - - 62  - -  01/04/12 0202 133/86 mmHg 99.4 F (37.4 C) Oral 62  20  100 %  A/P 6 days Postpartum pre-eclampsia Muscular skeletal weakness Discussed pt, labs, bps, plan neurology consult tomorrow, Magnesium, foley 24 hour protein, creatinine, AICU admission, discussed pre eclampsia and neuro consult with pt and verbalized understanding, Lewis partner and mother in room, colalboration with Dr. Pennie Rushing per telephone. Lavera Guise, CNM

## 2012-01-05 DIAGNOSIS — E876 Hypokalemia: Secondary | ICD-10-CM

## 2012-01-05 DIAGNOSIS — R209 Unspecified disturbances of skin sensation: Secondary | ICD-10-CM

## 2012-01-05 DIAGNOSIS — R51 Headache: Secondary | ICD-10-CM

## 2012-01-05 LAB — COMPREHENSIVE METABOLIC PANEL
BUN: 6 mg/dL (ref 6–23)
CO2: 24 mEq/L (ref 19–32)
Chloride: 107 mEq/L (ref 96–112)
Creatinine, Ser: 0.57 mg/dL (ref 0.50–1.10)
GFR calc non Af Amer: >90 mL/min (ref >90)
Total Bilirubin: 0.2 mg/dL — ABNORMAL LOW (ref 0.3–1.2)

## 2012-01-05 LAB — CREATININE CLEARANCE, URINE, 24 HOUR
Creatinine, 24H Ur: 1207 mg/d (ref 700–1800)
Urine Total Volume-CRCL: 3625 mL

## 2012-01-05 LAB — PROTEIN, URINE, 24 HOUR
Collection Interval-UPROT: 24 hours
Urine Total Volume-UPROT: 3625 mL

## 2012-01-05 LAB — MAGNESIUM: Magnesium: 2.6 mg/dL — ABNORMAL HIGH (ref 1.5–2.5)

## 2012-01-05 MED ORDER — POTASSIUM CHLORIDE CRYS ER 20 MEQ PO TBCR
20.0000 meq | EXTENDED_RELEASE_TABLET | Freq: Two times a day (BID) | ORAL | Status: DC
  Administered 2012-01-05: 20 meq via ORAL
  Filled 2012-01-05 (×3): qty 1

## 2012-01-05 MED ORDER — OXYCODONE-ACETAMINOPHEN 5-325 MG PO TABS: 1.0000 | ORAL_TABLET | ORAL | Status: DC | PRN

## 2012-01-05 NOTE — Consult Note (Addendum)
Follow up lactation visit with this mom in AICU. Her baby is in the room with her now. She is pumping and bottle feeding. She was just rinsing her pump parts, so I reviewed part care with hr. I encouraged her to call WIC and see if she would be able to get a WIC DEP, She has needed to use a manual pump in between breast feeding, due to large milk supply. She would like help latching the baby to her breast prior to her discharge to home,. She may go home today. She feels she can not breast feed until her iv hep lock is pulled from her right arm. Mom will call when she needs my assistance.I also let mom know she can come in for 2 outpatient lactation visits with Medicaid, as needed.

## 2012-01-05 NOTE — Progress Notes (Signed)
UR chart review completed.  

## 2012-01-05 NOTE — Progress Notes (Signed)
Post Partum Day 7 Subjective: The patient reports that her headaches have improved. She continues to complain of lower extremity numbness. She denies blurred vision and right upper quadrant tenderness.  Objective: Blood pressure 116/62, pulse 65, temperature 97.5 F (36.4 C), temperature source Oral, resp. rate 16, height 5' (1.524 m), weight 139 lb (63.05 kg), SpO2 97.00%, currently breastfeeding.  Physical Exam:  General: alert and no distress Lochia: appropriate Uterine Fundus: firm Incision: NA DVT Evaluation: No evidence of DVT seen on physical exam. Negative Homan's sign. Reflexes normal, no clonus  Abdomen nontender, no masses  CBC    Component Value Date/Time   WBC 11.2* 01/04/2012 0254   RBC 4.18 01/04/2012 0254   HGB 13.0 01/04/2012 0254   HCT 35.4* 01/04/2012 0254   PLT 227 01/04/2012 0254   MCV 84.7 01/04/2012 0254   MCH 31.1 01/04/2012 0254   MCHC 36.7* 01/04/2012 0254   RDW 13.2 01/04/2012 0254   LYMPHSABS 1.9 01/04/2012 0254   MONOABS 0.8 01/04/2012 0254   EOSABS 0.2 01/04/2012 0254   BASOSABS 0.0 01/04/2012 0254    CMP     Component Value Date/Time   NA 141 01/05/2012 0510   K 3.7 01/05/2012 0510   CL 107 01/05/2012 0510   CO2 24 01/05/2012 0510   GLUCOSE 82 01/05/2012 0510   BUN 6 01/05/2012 0510   CREATININE 0.57 01/05/2012 0510   CREATININE 0.57 01/04/2012 0600   CALCIUM 8.6 01/05/2012 0510   PROT 5.9* 01/05/2012 0510   ALBUMIN 2.9* 01/05/2012 0510   AST 31 01/05/2012 0510   ALT 53* 01/05/2012 0510   ALKPHOS 81 01/05/2012 0510   BILITOT 0.2* 01/05/2012 0510   GFRNONAA >90 01/05/2012 0510   GFRAA >90 01/05/2012 0510    Urine creatinine: Normal  24-hour urine protein: Pending  Basename 01/04/12 0254  HGB 13.0  HCT 35.4*    Assessment/Plan: Postpartum day 7 Improved headaches Lower extremity numbness of uncertain etiology. Hypokalemia. Improving liver enzyme values.  We will check the 24-hour urine protein. We will plan discharge to home today. We'll  replace potassium.   LOS: 1 day   Stepen Prins V 01/05/2012, 11:15 AM

## 2012-01-05 NOTE — Discharge Summary (Signed)
  motherObstetric Discharge Summary Reason for Admission: onset of labor Prenatal Procedures: ultrasound Intrapartum Procedures: spontaneous vaginal delivery and epidural  Postpartum Procedures: none Complications-Operative and Postpartum: none   Hospital Course:  Pt was admitted in active labor . neg GBS. She received an epidural. And Progressed to fully dilated, . Delivery was performed by H.Steelman, CNM,  without difficulty. Patient and baby tolerated the procedure without difficulty, with no laceration noted. Infant to FTN. Mother and infant then had an uncomplicated postpartum course, with breast feeding going well. Mom's physical exam was WNL, and she was discharged home in stable condition. Contraception plan was undecided, various methods discussed.  She received adequate benefit from po pain medications.  Discharge Diagnoses: Term Pregnancy-delivered  Discharge Information: Date: 01/05/2012 Activity: pelvic rest Diet: routine Medications:    Medication List     As of 01/05/2012  9:57 PM    START taking these medications         ibuprofen 600 MG tablet   Commonly known as: ADVIL,MOTRIN   Take 1 tablet (600 mg total) by mouth every 6 (six) hours.      oxyCODONE-acetaminophen 5-325 MG per tablet   Commonly known as: PERCOCET/ROXICET   Take 1-2 tablets by mouth every 4 (four) hours as needed (moderate - severe pain).      CONTINUE taking these medications         loratadine 10 MG tablet   Commonly known as: CLARITIN      prenatal multivitamin Tabs          Where to get your medications    These are the prescriptions that you need to pick up. We sent them to a specific pharmacy, so you will need to go there to get them.   [facility] - [place], [place] - [address]    [address] [place] [address]    Phone: [phone]        ibuprofen 600 MG tablet         You may get these medications from any pharmacy.         oxyCODONE-acetaminophen 5-325 MG per tablet           Condition: stable Instructions: refer to routine PP instructions  Discharge to: home Follow-up Information    Follow up with [facility]. In 5 weeks. (call the office to make a postpartum visit in 5-6 weeks )    Contact information:   [address] [place] [address] [phone]         Newborn Data: Live born  Information for the patient's newborn:  Barrie Lyme [[id number]]  female ; APGAR ,8, 9  ; weight ; 6#14oz  Home with mother.  Quintan Saldivar M 01/05/2012, 9:57 PM

## 2012-01-06 NOTE — Discharge Summary (Signed)
Physician Discharge Summary  Patient ID: LILJA SOLAND MRN: [id number] DOB/AGE: 1991-01-19 21 y.o.  Admit date: 01/04/2012 Discharge date: 01/06/2012  Admission Diagnoses: weakness and numbness in lower extremities   Discharge Diagnoses:  Active Problems:  PP numbness/neuro sx   Discharged Condition: stable  Hospital Course: Pt was admitted for observation of neuro status and mag sulfate was started for neuro prophylaxis and elevated LFT's and some variable BP's. 24 urine collection was started. She continued to improve and was able to ambulate in the room. Neurology was in to see pt and did not have any identified diagnosis or specific recommendations. 24 hour urine was neg for protein   Consults: neurology  Significant Diagnostic Studies: labs: PIH labs  and 24 hour urine   Treatments: mag sulfate for approx 4 hours  Discharge Exam: Blood pressure 139/71, pulse 61, temperature 98.4 F (36.9 C), temperature source Oral, resp. rate 16, height 5' (1.524 m), weight 139 lb (63.05 kg), SpO2 99.00%, currently breastfeeding. General appearance: alert and no distress, sitting on couch, nursing baby   Disposition: 50-Hospice/Home     Medication List     As of 01/06/2012 12:18 AM    TAKE these medications         ibuprofen 600 MG tablet   Commonly known as: ADVIL,MOTRIN   Take 1 tablet (600 mg total) by mouth every 6 (six) hours.      loratadine 10 MG tablet   Commonly known as: CLARITIN   Take 10 mg by mouth daily as needed. For allergies      oxyCODONE-acetaminophen 5-325 MG per tablet   Commonly known as: PERCOCET/ROXICET   Take 1-2 tablets by mouth every 4 (four) hours as needed (moderate - severe pain).      oxyCODONE-acetaminophen 5-325 MG per tablet   Commonly known as: PERCOCET/ROXICET   Take 1-2 tablets by mouth every 4 (four) hours as needed.      prenatal multivitamin Tabs   Take 1 tablet by mouth daily.           Follow-up Information    Follow up  with [facility]. In 5 weeks. (If symptoms worsen)    Contact information:   [address] [place] [address] [phone]         Signed: Malissa Hippo 01/06/2012, 12:18 AM

## 2012-01-13 ENCOUNTER — Encounter (HOSPITAL_COMMUNITY): Payer: Self-pay

## 2012-02-10 ENCOUNTER — Ambulatory Visit (INDEPENDENT_AMBULATORY_CARE_PROVIDER_SITE_OTHER): Payer: Medicaid Other | Admitting: Obstetrics and Gynecology

## 2012-02-10 DIAGNOSIS — Z124 Encounter for screening for malignant neoplasm of cervix: Secondary | ICD-10-CM

## 2012-02-10 DIAGNOSIS — R42 Dizziness and giddiness: Secondary | ICD-10-CM

## 2012-02-10 LAB — CBC WITH DIFFERENTIAL/PLATELET
Eosinophils Absolute: 0.1 10*3/uL (ref 0.0–0.7)
Hemoglobin: 14.3 g/dL (ref 12.0–15.0)
Lymphs Abs: 2.2 10*3/uL (ref 0.7–4.0)
MCH: 30.6 pg (ref 26.0–34.0)
Monocytes Relative: 7 % (ref 3–12)
Neutro Abs: 6.1 10*3/uL (ref 1.7–7.7)
Neutrophils Relative %: 67 % (ref 43–77)
RBC: 4.67 MIL/uL (ref 3.87–5.11)

## 2012-02-10 LAB — TSH: TSH: 0.871 u[IU]/mL (ref 0.350–4.500)

## 2012-02-10 MED ORDER — NORETHINDRONE 0.35 MG PO TABS: 1.0000 | ORAL_TABLET | Freq: Every day | ORAL | Status: DC

## 2012-02-10 NOTE — Progress Notes (Signed)
Date of delivery: 12/29/11 Female Name: lewis Vaginal delivery:yes Cesarean section:no Tubal ligation:no GDM:no Breast Feeding:yes Bottle Feeding:no Post-Partum Blues:no Abnormal pap:no Normal GU function: yes Normal GI function:yes Returning to work:yes EPDS: 0  Pt wants to discuss bc options

## 2012-02-10 NOTE — Progress Notes (Signed)
Subjective:     Allison Mathews is a 21 y.o. female who presents for a postpartum visit.  I have fully reviewed the prenatal and intrapartum course.    Patient is not sexually active.  Pt c/o occ dizziness/light-headed, occurs at random times, has been happening last few days, no syncope, usually resolves over time Pt states she's eating frequently, admits to not drinking much water Bleeding stopped about 2wks ago and now has returned Pt is breast feeding and supplementing   The following portions of the patient's history were reviewed and updated as appropriate: allergies, current medications, past family history, past medical history, past social history, past surgical history and problem list.  Review of Systems Pertinent items are noted in HPI.   Objective:    BP 110/70  Ht 5' (1.524 m)  Wt 132 lb (59.875 kg)  BMI 25.78 kg/m2  LMP 02/04/2012  Breastfeeding? Yes  General:  alert, cooperative and no distress     Lungs: clear to auscultation bilaterally  Heart:  regular rate and rhythm, S1, S2 normal, no murmur  Abdomen: soft, non-tender; bowel sounds normal; no masses,  no organomegaly   Vulva:  normal  Vagina: normal vagina  Cervix:  normal  Uterus : normal size, contour, position, consistency, mobility, non-tender  Adnexa:  normal adnexa             Assessment:    likely return of menses started Normal postpartum exam.  Pap smear done at today's visit.  Desires micronor for contraception  EPDS score = 0   Plan:  Will check CBC and TSH Enc frequent meals and increasing water intake If sx's persist, recommend f/u primary care dr rv'd micronor precautions F/u PRN or 1 year    S.Anarely Nicholls, CNM  02/10/2012 5:06 PM

## 2012-02-12 LAB — PAP IG, CT-NG, RFX HPV ASCU: Chlamydia Probe Amp: NEGATIVE

## 2012-02-19 ENCOUNTER — Emergency Department (HOSPITAL_COMMUNITY)
Admission: EM | Admit: 2012-02-19 | Discharge: 2012-02-19 | Disposition: A | Discharge status: Home / Self Care | Payer: Self-pay | Source: Home / Self Care | Attending: Emergency Medicine | Admitting: Emergency Medicine

## 2012-02-19 ENCOUNTER — Encounter (HOSPITAL_COMMUNITY): Payer: Self-pay | Admitting: *Deleted

## 2012-02-19 DIAGNOSIS — R109 Unspecified abdominal pain: Secondary | ICD-10-CM

## 2012-02-19 DIAGNOSIS — M549 Dorsalgia, unspecified: Secondary | ICD-10-CM

## 2012-02-19 DIAGNOSIS — R5381 Other malaise: Secondary | ICD-10-CM

## 2012-02-19 DIAGNOSIS — R42 Dizziness and giddiness: Secondary | ICD-10-CM

## 2012-02-19 LAB — POCT I-STAT, CHEM 8
BUN: 9 mg/dL (ref 6–23)
Chloride: 104 mEq/L (ref 96–112)
Creatinine, Ser: 0.8 mg/dL (ref 0.50–1.10)
Glucose, Bld: 76 mg/dL (ref 70–99)
Hemoglobin: 14.6 g/dL (ref 12.0–15.0)
Potassium: 3.8 mEq/L (ref 3.5–5.1)
Sodium: 141 mEq/L (ref 135–145)

## 2012-02-19 LAB — POCT URINALYSIS DIP (DEVICE)
Bilirubin Urine: NEGATIVE
Glucose, UA: NEGATIVE mg/dL
Hgb urine dipstick: NEGATIVE
Ketones, ur: NEGATIVE mg/dL
Specific Gravity, Urine: 1.02 (ref 1.005–1.030)

## 2012-02-19 NOTE — ED Notes (Signed)
Pt reports that she has been feeling weak and tired with generalized aches since delivering baby sept 24. States that ob office called and reported that tsh level was low (results state that levels were normal).

## 2012-02-19 NOTE — ED Provider Notes (Addendum)
Chief Complaint  Patient presents with  . Generalized Body Aches    History of Present Illness:   Allison Mathews is a 21 year old female who delivered a baby on September 23. She had preeclampsia with her pregnancy. After the delivery, her legs went numb and she passed out. She was hospitalized on September 26 at [facility] hospital but no cause for the symptoms was found. Ever since then she has just not felt right. She's been lightheaded, dizzy, had chills, and a weight loss from 158 down to 128 pounds. She also complains of headache, sore throat, cough, chest pain, palpitations, feeling weak, tired, achy all over. She's had pain in her back and abdomen. Her blood pressure last night was 180/70. She denies fever, rhinorrhea, shortness of breath, nausea, vomiting, diarrhea, urinary, or GYN symptoms. She went to her OB practice and was seen there by a nurse practitioner for her postpartum visit. Because of her symptoms she had a CBC and TSH done. She was called today and she was told that her TSH was low. In reviewing her blood work it appears that her TSH is in the normal range. Her CBC is also normal. Her biggest complaint is the dizziness and lightheadedness. She states that she feels lightheaded, sometimes feels like she's about to faint, and also feels spinning of her head and the surroundings. She denies any focal muscular weakness, diplopia, blurred vision, difficulty with speech, swallowing, ambulation, or paresthesias. She denies any depression or anxiety. She states she is not sleeping very well, getting up to breast-feed her baby every 2 hours.  Review of Systems:  Other than noted above, the patient denies any of the following symptoms. Systemic:  No fever, chills, sweats, fatigue, myalgias, headache, or anorexia. Eye:  No redness, pain or drainage. ENT:  No earache, nasal congestion, rhinorrhea, sinus pressure, or sore throat. Lungs:  No cough, sputum production, wheezing, shortness of breath.    Cardiovascular:  No chest pain, palpitations, or syncope. GI:  No nausea, vomiting, abdominal pain or diarrhea. GU:  No dysuria, frequency, or hematuria. Skin:  No rash or pruritis.  PMFSH:  Past medical history, family history, social history, meds, and allergies were reviewed.   Physical Exam:   Vital signs:  BP 130/81  Pulse 59  Temp 97.1 F (36.2 C) (Oral)  Resp 18  SpO2 99%  LMP 02/04/2012  Breastfeeding? Yes Filed Vitals:   02/19/12 1523 02/19/12 1620 Supine  162211/14/13  Sitting  02/19/12 1624 Standing   BP: 137/85 112/73 136/73 130/81  Pulse: 86 56 58 59  Temp: 97.1 F (36.2 C)     TempSrc: Oral     Resp: 18     SpO2: 99%      General:  Alert, in no distress. Eye:  PERRL, full EOMs.  Lids and conjunctivas were normal. ENT:  TMs and canals were normal, without erythema or inflammation.  Nasal mucosa was clear and uncongested, without drainage.  Mucous membranes were moist.  Pharynx was clear, without exudate or drainage.  There were no oral ulcerations or lesions. Neck:  Supple, no adenopathy, tenderness or mass. Thyroid was normal. Lungs:  No respiratory distress.  Lungs were clear to auscultation, without wheezes, rales or rhonchi.  Breath sounds were clear and equal bilaterally. Heart:  Regular rhythm, without gallops, murmers or rubs. Abdomen:  Soft, flat, and non-tender to palpation.  No hepatosplenomagaly or mass. Neuro exam: Neurological examination: The patient is alert and oriented x3. Speech is clear, fluent, and appropriate. Cranial nerves  are intact. There is no pronator drift and finger to nose was normal. Muscle strength, sensation, and DTRs are normal. Babinskis are downgoing. Station and gait were normal. Romberg sign is negative, patient is able to perform tandem gait well. Skin:  Clear, warm, and dry, without rash or lesions.  Labs:   Results for orders placed during the hospital encounter of 02/19/12  POCT URINALYSIS DIP (DEVICE)      Component  Value Range   Glucose, UA NEGATIVE  NEGATIVE mg/dL   Bilirubin Urine NEGATIVE  NEGATIVE   Ketones, ur NEGATIVE  NEGATIVE mg/dL   Specific Gravity, Urine 1.020  1.005 - 1.030   Hgb urine dipstick NEGATIVE  NEGATIVE   pH 7.0  5.0 - 8.0   Protein, ur NEGATIVE  NEGATIVE mg/dL   Urobilinogen, UA 0.2  0.0 - 1.0 mg/dL   Nitrite NEGATIVE  NEGATIVE   Leukocytes, UA NEGATIVE  NEGATIVE  POCT I-STAT, CHEM 8      Component Value Range   Sodium 141  135 - 145 mEq/L   Potassium 3.8  3.5 - 5.1 mEq/L   Chloride 104  96 - 112 mEq/L   BUN 9  6 - 23 mg/dL   Creatinine, Ser 1.61  0.50 - 1.10 mg/dL   Glucose, Bld 76  70 - 99 mg/dL   Calcium, Ion 0.96 (*) 1.12 - 1.23 mmol/L   TCO2 25  0 - 100 mmol/L   Hemoglobin 14.6  12.0 - 15.0 g/dL   HCT 04.5  40.9 - 81.1 %     Date: 02/19/2012  Rate: 58  Rhythm: normal sinus rhythm  QRS Axis: normal  Intervals: normal  ST/T Wave abnormalities: normal  Conduction Disutrbances:none  Narrative Interpretation: Sinus bradycardia with sinus arrhythmia, otherwise normal EKG.  Old EKG Reviewed: none available  Assessment:  The primary encounter diagnosis was Dizziness. Diagnoses of Malaise and fatigue, Back pain, and Abdominal pain were also pertinent to this visit.  I cannot find an obvious explanation for her symptoms. It's possible that this could be minor postpartum depression or just sleep deprivation. I have recommended that she followup with her primary care physician in a week.  Plan:   1.  The following meds were prescribed:   New Prescriptions   No medications on file   2.  The patient was instructed in symptomatic care and handouts were given. 3.  The patient was told to return if becoming worse in any way, if no better in 3 or 4 days, and given some red flag symptoms that would indicate earlier return.    Reuben Likes, MD 02/19/12 2209  Reuben Likes, MD 02/22/12 [phone]

## 2012-06-24 ENCOUNTER — Emergency Department (HOSPITAL_COMMUNITY): Payer: Self-pay

## 2012-06-24 ENCOUNTER — Emergency Department (HOSPITAL_COMMUNITY)
Admission: EM | Admit: 2012-06-24 | Discharge: 2012-06-24 | Disposition: A | Discharge status: Home / Self Care | Payer: Self-pay | Attending: Emergency Medicine | Admitting: Emergency Medicine

## 2012-06-24 ENCOUNTER — Encounter (HOSPITAL_COMMUNITY): Payer: Self-pay | Admitting: Emergency Medicine

## 2012-06-24 DIAGNOSIS — R059 Cough, unspecified: Secondary | ICD-10-CM | POA: Insufficient documentation

## 2012-06-24 DIAGNOSIS — R011 Cardiac murmur, unspecified: Secondary | ICD-10-CM | POA: Insufficient documentation

## 2012-06-24 DIAGNOSIS — Z8619 Personal history of other infectious and parasitic diseases: Secondary | ICD-10-CM | POA: Insufficient documentation

## 2012-06-24 DIAGNOSIS — Z79899 Other long term (current) drug therapy: Secondary | ICD-10-CM | POA: Insufficient documentation

## 2012-06-24 DIAGNOSIS — Z862 Personal history of diseases of the blood and blood-forming organs and certain disorders involving the immune mechanism: Secondary | ICD-10-CM | POA: Insufficient documentation

## 2012-06-24 DIAGNOSIS — J069 Acute upper respiratory infection, unspecified: Secondary | ICD-10-CM | POA: Insufficient documentation

## 2012-06-24 DIAGNOSIS — Z8739 Personal history of other diseases of the musculoskeletal system and connective tissue: Secondary | ICD-10-CM | POA: Insufficient documentation

## 2012-06-24 DIAGNOSIS — R079 Chest pain, unspecified: Secondary | ICD-10-CM | POA: Insufficient documentation

## 2012-06-24 DIAGNOSIS — J029 Acute pharyngitis, unspecified: Secondary | ICD-10-CM | POA: Insufficient documentation

## 2012-06-24 LAB — URINALYSIS, ROUTINE W REFLEX MICROSCOPIC
Hgb urine dipstick: NEGATIVE
Specific Gravity, Urine: 1.017 (ref 1.005–1.030)
Urobilinogen, UA: 0.2 mg/dL (ref 0.0–1.0)
pH: 7 (ref 5.0–8.0)

## 2012-06-24 LAB — COMPREHENSIVE METABOLIC PANEL
ALT: 11 U/L (ref 0–35)
Alkaline Phosphatase: 90 U/L (ref 39–117)
CO2: 28 mEq/L (ref 19–32)
GFR calc Af Amer: >90 mL/min (ref >90)
GFR calc non Af Amer: >90 mL/min (ref >90)
Glucose, Bld: 96 mg/dL (ref 70–99)
Potassium: 4.3 mEq/L (ref 3.5–5.1)
Sodium: 138 mEq/L (ref 135–145)

## 2012-06-24 LAB — CBC WITH DIFFERENTIAL/PLATELET
Eosinophils Relative: 3 % (ref 0–5)
Hemoglobin: 14.9 g/dL (ref 12.0–15.0)
Lymphocytes Relative: 17 % (ref 12–46)
Lymphs Abs: 1.6 10*3/uL (ref 0.7–4.0)
MCV: 83.8 fL (ref 78.0–100.0)
Monocytes Relative: 14 % — ABNORMAL HIGH (ref 3–12)
Neutrophils Relative %: 66 % (ref 43–77)
Platelets: 224 10*3/uL (ref 150–400)
RBC: 4.81 MIL/uL (ref 3.87–5.11)
WBC: 9.7 10*3/uL (ref 4.0–10.5)

## 2012-06-24 MED ORDER — BENZONATATE 100 MG PO CAPS: 100.0000 mg | ORAL_CAPSULE | Freq: Three times a day (TID) | ORAL | Status: DC

## 2012-06-24 MED ORDER — NAPROXEN 500 MG PO TABS: 500.0000 mg | ORAL_TABLET | Freq: Two times a day (BID) | ORAL | Status: DC

## 2012-06-24 NOTE — ED Provider Notes (Addendum)
History    CSN: [id number] Arrival date & time 06/24/12  1446First MD Initiated Contact with Patient 06/24/12 1848      Chief Complaint  Patient presents with  . Chest Pain    Patient is a 22 y.o. female presenting with chest pain. The history is provided by the patient.  Chest Pain Pain location:  Substernal area Pain quality comment:  Hurting, Pain radiates to:  Does not radiate Pain severity:  Moderate Onset quality:  Gradual Duration:  4 days Timing:  Constant Relieved by:  Nothing Worsened by:  Nothing tried Ineffective treatments:  None tried Associated symptoms: cough   Associated symptoms: no fever and not vomiting   Pt has had uri symptoms as well with sore throat, nasal congestion and cough. No leg swelling.  No history of PE.  Past Medical History  Diagnosis Date  . Heart murmur   . Sickle cell trait   . Anemia     FeSO4 taken  . Scoliosis   . Yeast infection     NOT FREQUENT  . Active labor 12/29/2011  . NSVD (normal spontaneous vaginal delivery) 12/29/2011    Past Surgical History  Procedure Laterality Date  . No past surgeries      Family History  Problem Relation Age of Onset  . Anesthesia problems Neg Hx   . Hypotension Neg Hx   . Malignant hyperthermia Neg Hx   . Pseudochol deficiency Neg Hx   . Diabetes Father   . Hypertension Paternal Aunt   . Diabetes Paternal Grandmother     History  Substance Use Topics  . Smoking status: Never Smoker   . Smokeless tobacco: Never Used  . Alcohol Use: No    OB History   Grav Para Term Preterm Abortions TAB SAB Ect Mult Living   3 2 2  1  1   2       Review of Systems  Constitutional: Negative for fever.  HENT: Positive for sore throat.   Respiratory: Positive for cough.   Cardiovascular: Positive for chest pain.  Gastrointestinal: Negative for vomiting.  Genitourinary: Negative for dysuria.    Allergies  Review of patient's allergies indicates no known allergies.  Home Medications    Current Outpatient Rx  Name  Route  Sig  Dispense  Refill  . ibuprofen (ADVIL,MOTRIN) 600 MG tablet   Oral   Take 1 tablet (600 mg total) by mouth every 6 (six) hours.   30 tablet   2   . loratadine (CLARITIN) 10 MG tablet   Oral   Take 10 mg by mouth daily as needed. For allergies         . Prenatal Vit-Fe Fumarate-FA (PRENATAL MULTIVITAMIN) TABS   Oral   Take 1 tablet by mouth daily.         . benzonatate (TESSALON) 100 MG capsule   Oral   Take 1 capsule (100 mg total) by mouth every 8 (eight) hours.   21 capsule   0   . naproxen (NAPROSYN) 500 MG tablet   Oral   Take 1 tablet (500 mg total) by mouth 2 (two) times daily.   30 tablet   0     BP 135/91  Pulse 70  Temp(Src) 98.1 F (36.7 C) (Oral)  Resp 15  SpO2 100%  LMP 05/24/2012  Physical Exam  Nursing note and vitals reviewed. Constitutional: She appears well-developed and well-nourished. No distress.  HENT:  Head: Normocephalic and atraumatic.  Right Ear: External ear normal.  Left Ear: External ear normal.  Eyes: Conjunctivae are normal. Right eye exhibits no discharge. Left eye exhibits no discharge. No scleral icterus.  Neck: Neck supple. No tracheal deviation present.  Cardiovascular: Normal rate, regular rhythm and intact distal pulses.   Pulmonary/Chest: Effort normal and breath sounds normal. No stridor. No respiratory distress. She has no wheezes. She has no rales.  Abdominal: Soft. Bowel sounds are normal. She exhibits no distension. There is no tenderness. There is no rebound and no guarding.  Musculoskeletal: She exhibits no edema and no tenderness.  Neurological: She is alert. She has normal strength. No sensory deficit. Cranial nerve deficit:  no gross defecits noted. She exhibits normal muscle tone. She displays no seizure activity. Coordination normal.  Skin: Skin is warm and dry. No rash noted.  Psychiatric: She has a normal mood and affect.    ED Course  Procedures (including  critical care time) EKG Rate 90 SINUS RHYTHM ~ normal P axis, V-rate 50- 99 PROBABLE LEFT ATRIAL ABNORMALITY ~ P >67mS, <-0.24mV V1 Labs Reviewed  CBC WITH DIFFERENTIAL - Abnormal; Notable for the following:    MCHC 37.0 (*)    Monocytes Relative 14 (*)    Monocytes Absolute 1.3 (*)    All other components within normal limits  COMPREHENSIVE METABOLIC PANEL - Abnormal; Notable for the following:    Total Bilirubin 0.2 (*)    All other components within normal limits  URINALYSIS, ROUTINE W REFLEX MICROSCOPIC  POCT I-STAT TROPONIN I   Dg Chest 2 View  06/24/2012  *RADIOLOGY REPORT*  Clinical Data: Anterior chest pain and epigastric pain. Congestion.  CHEST - 2 VIEW  Comparison: Report from 03/04/2002  Findings: Mild levoconvex thoracolumbar scoliosis and dextroconvex lower lumbar scoliosis.  Mild cardiomegaly noted without edema. The lungs appear clear.  No pleural effusion.  IMPRESSION:  1.  Mild cardiomegaly, without edema. 2.  Mild thoracolumbar scoliosis.   Original Report Authenticated By: Gaylyn Rong, M.D.     1. Chest pain   2. URI, acute     MDM  Doubt PE, ACS.  Suspect symptoms are related to URI and cough.  Abdomen benign.         Celene Kras, MD 06/24/12 1610  Celene Kras, MD 07/06/12 [phone]

## 2012-06-24 NOTE — ED Notes (Signed)
Per patient states chest discomfort for 4 days, thought it would go away, states abdominal/back pain

## 2013-03-28 ENCOUNTER — Encounter (HOSPITAL_COMMUNITY): Payer: Self-pay | Admitting: Emergency Medicine

## 2013-03-28 ENCOUNTER — Emergency Department (HOSPITAL_COMMUNITY)
Admission: EM | Admit: 2013-03-28 | Discharge: 2013-03-29 | Disposition: A | Discharge status: Home / Self Care | Payer: Medicaid Other | Attending: Emergency Medicine | Admitting: Emergency Medicine

## 2013-03-28 DIAGNOSIS — R0602 Shortness of breath: Secondary | ICD-10-CM | POA: Insufficient documentation

## 2013-03-28 DIAGNOSIS — R209 Unspecified disturbances of skin sensation: Secondary | ICD-10-CM | POA: Insufficient documentation

## 2013-03-28 DIAGNOSIS — R51 Headache: Secondary | ICD-10-CM | POA: Insufficient documentation

## 2013-03-28 DIAGNOSIS — F410 Panic disorder [episodic paroxysmal anxiety] without agoraphobia: Secondary | ICD-10-CM | POA: Insufficient documentation

## 2013-03-28 DIAGNOSIS — Z8739 Personal history of other diseases of the musculoskeletal system and connective tissue: Secondary | ICD-10-CM | POA: Insufficient documentation

## 2013-03-28 DIAGNOSIS — D573 Sickle-cell trait: Secondary | ICD-10-CM | POA: Insufficient documentation

## 2013-03-28 DIAGNOSIS — Z8619 Personal history of other infectious and parasitic diseases: Secondary | ICD-10-CM | POA: Insufficient documentation

## 2013-03-28 DIAGNOSIS — R011 Cardiac murmur, unspecified: Secondary | ICD-10-CM | POA: Insufficient documentation

## 2013-03-28 DIAGNOSIS — R079 Chest pain, unspecified: Secondary | ICD-10-CM | POA: Insufficient documentation

## 2013-03-28 NOTE — ED Notes (Signed)
Bed: MW10 Expected date:  Expected time:  Means of arrival:  Comments: EMS anxiety vs [facility]

## 2013-03-28 NOTE — ED Notes (Addendum)
Brought in by EMS from home with c/o shortness of breath and anxiety attack. Per EMS, pt's boyfriend called EMS and reported that pt was short of breath and having an anxiety attack; pt's boyfriend reports that pt and he were having an argument when pt started exhibiting symptoms.  Pt arrived to ED in no s/s distress, not verbally responding.

## 2013-03-29 MED ORDER — LORAZEPAM 1 MG PO TABS
1.0000 mg | ORAL_TABLET | Freq: Once | ORAL | Status: AC
  Administered 2013-03-29: 1 mg via ORAL
  Filled 2013-03-29: qty 1

## 2013-03-29 MED ORDER — LORAZEPAM 1 MG PO TABS: 1.0000 mg | ORAL_TABLET | Freq: Three times a day (TID) | ORAL | Status: DC | PRN

## 2013-03-29 NOTE — ED Provider Notes (Signed)
CSN: [id number]     Arrival date & time 03/28/13  2332 History   First MD Initiated Contact with Patient 03/28/13 2338     Chief Complaint  Patient presents with  . Anxiety   (Consider location/radiation/quality/duration/timing/severity/associated sxs/prior Treatment) HPI Comments: Patient here after argument with her husband.  She states that during this she began to have numbness to her lips and tongue, she became short of breath and began to breath harder, she reports then numbness to her fingertips and chest tightness.  She reports this has happened once before just post-partum with her last child (about 8 months ago) and she states that she was admitted for several days before because she was unable to walk.  She reports mild decrease in symptoms and states she is able to speak.  She denies suicidal or homicidal ideation, nausea, vomiting but she does report a mild frontal headache.  Patient is a 22 y.o. female presenting with anxiety. The history is provided by the patient and the spouse. No language interpreter was used.  Anxiety This is a new problem. The current episode started today. The problem occurs constantly. The problem has been gradually improving. Associated symptoms include chest pain, headaches and numbness. Pertinent negatives include no abdominal pain, anorexia, congestion, coughing, diaphoresis, fatigue, fever, myalgias, nausea, neck pain, sore throat, vertigo, vomiting or weakness. Nothing aggravates the symptoms. She has tried nothing for the symptoms. The treatment provided no relief.    Past Medical History  Diagnosis Date  . Heart murmur   . Sickle cell trait   . Anemia     FeSO4 taken  . Scoliosis   . Yeast infection     NOT FREQUENT  . Active labor 12/29/2011  . NSVD (normal spontaneous vaginal delivery) 12/29/2011   Past Surgical History  Procedure Laterality Date  . No past surgeries     Family History  Problem Relation Age of Onset  . Anesthesia  problems Neg Hx   . Hypotension Neg Hx   . Malignant hyperthermia Neg Hx   . Pseudochol deficiency Neg Hx   . Diabetes Father   . Hypertension Paternal Aunt   . Diabetes Paternal Grandmother    History  Substance Use Topics  . Smoking status: Never Smoker   . Smokeless tobacco: Never Used  . Alcohol Use: No   OB History   Grav Para Term Preterm Abortions TAB SAB Ect Mult Living   3 2 2  1  1   2      Review of Systems  Constitutional: Negative for fever, diaphoresis and fatigue.  HENT: Negative for congestion and sore throat.   Respiratory: Negative for cough.   Cardiovascular: Positive for chest pain.  Gastrointestinal: Negative for nausea, vomiting, abdominal pain and anorexia.  Musculoskeletal: Negative for myalgias and neck pain.  Neurological: Positive for numbness and headaches. Negative for vertigo and weakness.  All other systems reviewed and are negative.    Allergies  Review of patient's allergies indicates no known allergies.  Home Medications   Current Outpatient Rx  Name  Route  Sig  Dispense  Refill  . ibuprofen (ADVIL,MOTRIN) 200 MG tablet   Oral   Take 200 mg by mouth every 6 (six) hours as needed (pain).          BP 131/69  Pulse 63  Temp(Src) 98.2 F (36.8 C) (Oral)  Resp 20  Ht 4\' 11"  (1.499 m)  Wt 140 lb (63.504 kg)  BMI 28.26 kg/m2  SpO2 100% Physical  Exam  Nursing note and vitals reviewed. Constitutional: She is oriented to person, place, and time. She appears well-developed and well-nourished. No distress.  HENT:  Head: Normocephalic and atraumatic.  Right Ear: External ear normal.  Left Ear: External ear normal.  Nose: Nose normal.  Mouth/Throat: Oropharynx is clear and moist. No oropharyngeal exudate.  Eyes: Conjunctivae are normal. Pupils are equal, round, and reactive to light. No scleral icterus.  Neck: Normal range of motion. Neck supple.  Cardiovascular: Normal rate, regular rhythm and normal heart sounds.  Exam reveals no  gallop and no friction rub.   No murmur heard. Pulmonary/Chest: Effort normal and breath sounds normal. No respiratory distress. She has no wheezes. She has no rales. She exhibits no tenderness.  Abdominal: Soft. Bowel sounds are normal. She exhibits no distension. There is no tenderness. There is no rebound and no guarding.  Musculoskeletal: Normal range of motion. She exhibits no edema and no tenderness.  Lymphadenopathy:    She has no cervical adenopathy.  Neurological: She is alert and oriented to person, place, and time. She exhibits normal muscle tone. Coordination normal.  Skin: Skin is warm and dry. No rash noted. No erythema. No pallor.  Psychiatric: She has a normal mood and affect. Her behavior is normal. Judgment and thought content normal.    ED Course  Procedures (including critical care time) Labs Review Labs Reviewed - No data to display Imaging Review No results found.  EKG Interpretation    Date/Time:  Tuesday March 29 2013 00:11:00 EST Ventricular Rate:  66 PR Interval:  160 QRS Duration: 74 QT Interval:  393 QTC Calculation: 412 R Axis:   55 Text Interpretation:  Sinus rhythm Borderline T abnormalities, anterior leads No significant change since last tracing Confirmed by OTTER  MD, OLGA ([id number]) on 03/29/2013 12:16:29 AM            MDM  Anxiety attack  Patient here after verbal argument with her husband.  Her EKG is unremarkable, she is not suicidal or homicidal at this time.  Plan for discharge and follow up with PCP.   Izola Price Marisue Humble, PA-C 03/29/13 0121

## 2013-03-29 NOTE — ED Provider Notes (Signed)
Medical screening examination/treatment/procedure(s) were performed by non-physician practitioner and as supervising physician I was immediately available for consultation/collaboration.  EKG Interpretation    Date/Time:  Tuesday March 29 2013 00:11:00 EST Ventricular Rate:  66 PR Interval:  160 QRS Duration: 74 QT Interval:  393 QTC Calculation: 412 R Axis:   55 Text Interpretation:  Sinus rhythm Borderline T abnormalities, anterior leads No significant change since last tracing Confirmed by Itzamara Casas  MD, Flora Ratz ([id number]) on 03/29/2013 12:16:29 AM             Olivia Mackie, MD 03/29/13 [phone]

## 2013-07-17 ENCOUNTER — Emergency Department (HOSPITAL_COMMUNITY)
Admission: EM | Admit: 2013-07-17 | Discharge: 2013-07-17 | Disposition: A | Discharge status: Home / Self Care | Payer: Medicaid Other | Attending: Emergency Medicine | Admitting: Emergency Medicine

## 2013-07-17 ENCOUNTER — Encounter (HOSPITAL_COMMUNITY): Payer: Self-pay | Admitting: Emergency Medicine

## 2013-07-17 DIAGNOSIS — R011 Cardiac murmur, unspecified: Secondary | ICD-10-CM | POA: Insufficient documentation

## 2013-07-17 DIAGNOSIS — Z79899 Other long term (current) drug therapy: Secondary | ICD-10-CM | POA: Insufficient documentation

## 2013-07-17 DIAGNOSIS — D649 Anemia, unspecified: Secondary | ICD-10-CM | POA: Insufficient documentation

## 2013-07-17 DIAGNOSIS — Z8619 Personal history of other infectious and parasitic diseases: Secondary | ICD-10-CM | POA: Insufficient documentation

## 2013-07-17 DIAGNOSIS — Y9389 Activity, other specified: Secondary | ICD-10-CM | POA: Insufficient documentation

## 2013-07-17 DIAGNOSIS — Y92410 Unspecified street and highway as the place of occurrence of the external cause: Secondary | ICD-10-CM | POA: Insufficient documentation

## 2013-07-17 DIAGNOSIS — S7010XA Contusion of unspecified thigh, initial encounter: Secondary | ICD-10-CM | POA: Insufficient documentation

## 2013-07-17 DIAGNOSIS — S7012XA Contusion of left thigh, initial encounter: Secondary | ICD-10-CM

## 2013-07-17 DIAGNOSIS — M4120 Other idiopathic scoliosis, site unspecified: Secondary | ICD-10-CM | POA: Insufficient documentation

## 2013-07-17 MED ORDER — HYDROCODONE-ACETAMINOPHEN 5-325 MG PO TABS: 1.0000 | ORAL_TABLET | ORAL | Status: DC | PRN

## 2013-07-17 MED ORDER — IBUPROFEN 800 MG PO TABS: 800.0000 mg | ORAL_TABLET | Freq: Three times a day (TID) | ORAL | Status: DC
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
## 2013-07-17 NOTE — Discharge Instructions (Signed)
Call for a follow up appointment with a Family or Primary Care Provider.  Return if Symptoms worsen.   Take medication as prescribed.  Ice your leg 3-4 times a day fr 15-20 minutes.
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
## 2013-07-21 NOTE — ED Provider Notes (Signed)
Medical screening examination/treatment/procedure(s) were performed by non-physician practitioner and as supervising physician I was immediately available for consultation/collaboration.    Sandeep Radell L Daman Steffenhagen, MD 07/21/13 1526 

## 2013-09-01 ENCOUNTER — Encounter (HOSPITAL_COMMUNITY): Payer: Self-pay | Admitting: Emergency Medicine

## 2013-09-01 ENCOUNTER — Emergency Department (HOSPITAL_COMMUNITY)
Admission: EM | Admit: 2013-09-01 | Discharge: 2013-09-02 | Disposition: A | Discharge status: Home / Self Care | Payer: Medicaid Other | Attending: Emergency Medicine | Admitting: Emergency Medicine

## 2013-09-01 DIAGNOSIS — Z791 Long term (current) use of non-steroidal anti-inflammatories (NSAID): Secondary | ICD-10-CM | POA: Insufficient documentation

## 2013-09-01 DIAGNOSIS — R1013 Epigastric pain: Secondary | ICD-10-CM | POA: Insufficient documentation

## 2013-09-01 DIAGNOSIS — R110 Nausea: Secondary | ICD-10-CM | POA: Insufficient documentation

## 2013-09-01 DIAGNOSIS — R0602 Shortness of breath: Secondary | ICD-10-CM | POA: Insufficient documentation

## 2013-09-01 DIAGNOSIS — R011 Cardiac murmur, unspecified: Secondary | ICD-10-CM | POA: Insufficient documentation

## 2013-09-01 DIAGNOSIS — B349 Viral infection, unspecified: Secondary | ICD-10-CM

## 2013-09-01 DIAGNOSIS — Z8739 Personal history of other diseases of the musculoskeletal system and connective tissue: Secondary | ICD-10-CM | POA: Insufficient documentation

## 2013-09-01 DIAGNOSIS — B9789 Other viral agents as the cause of diseases classified elsewhere: Secondary | ICD-10-CM | POA: Insufficient documentation

## 2013-09-01 DIAGNOSIS — Z8619 Personal history of other infectious and parasitic diseases: Secondary | ICD-10-CM | POA: Insufficient documentation

## 2013-09-01 DIAGNOSIS — R109 Unspecified abdominal pain: Secondary | ICD-10-CM

## 2013-09-01 DIAGNOSIS — Z862 Personal history of diseases of the blood and blood-forming organs and certain disorders involving the immune mechanism: Secondary | ICD-10-CM | POA: Insufficient documentation

## 2013-09-01 DIAGNOSIS — R197 Diarrhea, unspecified: Secondary | ICD-10-CM

## 2013-09-01 DIAGNOSIS — Z3202 Encounter for pregnancy test, result negative: Secondary | ICD-10-CM | POA: Insufficient documentation

## 2013-09-01 DIAGNOSIS — R1084 Generalized abdominal pain: Secondary | ICD-10-CM | POA: Insufficient documentation

## 2013-09-01 NOTE — ED Notes (Signed)
Pt states that she began having diarrhea this afternoon; pt states that she has had diarrhea approx 5 times today; pt c/o nausea without vomiting; pt c/o feeling dizzy at present

## 2013-09-02 ENCOUNTER — Emergency Department (HOSPITAL_COMMUNITY): Payer: Medicaid Other

## 2013-09-02 LAB — URINALYSIS, ROUTINE W REFLEX MICROSCOPIC
Bilirubin Urine: NEGATIVE
GLUCOSE, UA: NEGATIVE mg/dL
HGB URINE DIPSTICK: NEGATIVE
KETONES UR: NEGATIVE mg/dL
LEUKOCYTES UA: NEGATIVE
Nitrite: NEGATIVE
PH: 7 (ref 5.0–8.0)
Protein, ur: NEGATIVE mg/dL
Specific Gravity, Urine: 1.019 (ref 1.005–1.030)
Urobilinogen, UA: 1 mg/dL (ref 0.0–1.0)

## 2013-09-02 LAB — CBC
HEMATOCRIT: 40.3 % (ref 36.0–46.0)
HEMOGLOBIN: 14.9 g/dL (ref 12.0–15.0)
MCH: 31 pg (ref 26.0–34.0)
MCHC: 37 g/dL — ABNORMAL HIGH (ref 30.0–36.0)
MCV: 83.8 fL (ref 78.0–100.0)
Platelets: 202 10*3/uL (ref 150–400)
RBC: 4.81 MIL/uL (ref 3.87–5.11)
RDW: 13.1 % (ref 11.5–15.5)
WBC: 13.4 10*3/uL — AB (ref 4.0–10.5)

## 2013-09-02 LAB — BASIC METABOLIC PANEL
BUN: 10 mg/dL (ref 6–23)
CHLORIDE: 100 meq/L (ref 96–112)
CO2: 26 meq/L (ref 19–32)
Calcium: 9.2 mg/dL (ref 8.4–10.5)
Creatinine, Ser: 0.59 mg/dL (ref 0.50–1.10)
GFR calc Af Amer: >90 mL/min (ref >90)
GLUCOSE: 96 mg/dL (ref 70–99)
POTASSIUM: 4 meq/L (ref 3.7–5.3)
SODIUM: 139 meq/L (ref 137–147)

## 2013-09-02 LAB — POC URINE PREG, ED: Preg Test, Ur: NEGATIVE

## 2013-09-02 MED ORDER — HYDROCODONE-ACETAMINOPHEN 5-325 MG PO TABS: 1.0000 | ORAL_TABLET | Freq: Four times a day (QID) | ORAL | Status: DC | PRN

## 2013-09-02 MED ORDER — KETOROLAC TROMETHAMINE 30 MG/ML IJ SOLN
30.0000 mg | Freq: Once | INTRAMUSCULAR | Status: AC
  Administered 2013-09-02: 30 mg via INTRAVENOUS
  Filled 2013-09-02: qty 1

## 2013-09-02 MED ORDER — IOHEXOL 300 MG/ML  SOLN
100.0000 mL | Freq: Once | INTRAMUSCULAR | Status: AC | PRN
  Administered 2013-09-02: 100 mL via INTRAVENOUS

## 2013-09-02 MED ORDER — ONDANSETRON HCL 4 MG PO TABS: 4.0000 mg | ORAL_TABLET | Freq: Four times a day (QID) | ORAL | Status: DC

## 2013-09-02 MED ORDER — ONDANSETRON HCL 4 MG/2ML IJ SOLN
4.0000 mg | INTRAMUSCULAR | Status: AC
  Administered 2013-09-02: 4 mg via INTRAVENOUS
  Filled 2013-09-02: qty 2

## 2013-09-02 MED ORDER — MORPHINE SULFATE 4 MG/ML IJ SOLN
4.0000 mg | Freq: Once | INTRAMUSCULAR | Status: AC
  Administered 2013-09-02: 4 mg via INTRAVENOUS
  Filled 2013-09-02: qty 1

## 2013-09-02 MED ORDER — IOHEXOL 300 MG/ML  SOLN
50.0000 mL | Freq: Once | INTRAMUSCULAR | Status: AC | PRN
  Administered 2013-09-02: 50 mL via ORAL

## 2013-09-02 MED ORDER — ONDANSETRON HCL 4 MG/2ML IJ SOLN
4.0000 mg | Freq: Once | INTRAMUSCULAR | Status: AC
  Administered 2013-09-02: 4 mg via INTRAVENOUS
  Filled 2013-09-02: qty 2

## 2013-09-02 MED ORDER — SODIUM CHLORIDE 0.9 % IV BOLUS (SEPSIS)
1000.0000 mL | Freq: Once | INTRAVENOUS | Status: AC
  Administered 2013-09-02: 1000 mL via INTRAVENOUS

## 2013-09-02 MED ORDER — OXYCODONE-ACETAMINOPHEN 5-325 MG PO TABS
2.0000 | ORAL_TABLET | Freq: Once | ORAL | Status: AC
  Administered 2013-09-02: 2 via ORAL
  Filled 2013-09-02: qty 2

## 2013-09-02 MED ORDER — HYDROMORPHONE HCL PF 1 MG/ML IJ SOLN
1.0000 mg | Freq: Once | INTRAMUSCULAR | Status: AC
  Administered 2013-09-02: 1 mg via INTRAVENOUS
  Filled 2013-09-02: qty 1

## 2013-09-02 NOTE — ED Notes (Signed)
Per Hyman Bible continue to discharge pt.

## 2013-09-02 NOTE — ED Provider Notes (Signed)
CSN: [id number]     Arrival date & time 09/01/13  2209 History   First MD Initiated Contact with Patient 09/02/13 0044     Chief Complaint  Patient presents with  . Diarrhea    (Consider location/radiation/quality/duration/timing/severity/associated sxs/prior Treatment) HPI Comments: Patient denies recent travel and recent abx use. Patient is a 23 y.o. female presenting with diarrhea. The history is provided by the patient. No language interpreter was used.  Diarrhea Quality:  Watery Severity:  Moderate Onset quality:  Gradual Number of episodes:  5 Duration:  1 day Timing:  Intermittent Progression:  Partially resolved Relieved by:  None tried Ineffective treatments:  None tried Associated symptoms: abdominal pain   Associated symptoms: no fever, no URI and no vomiting   Associated symptoms comment:  +nausea Abdominal pain:    Location:  Generalized   Quality:  Aching, sharp and cramping   Severity:  Moderate   Progression:  Waxing and waning   Chronicity:  New Risk factors: no recent antibiotic use, no sick contacts and no travel to endemic areas     Past Medical History  Diagnosis Date  . Heart murmur   . Sickle cell trait   . Anemia     FeSO4 taken  . Scoliosis   . Yeast infection     NOT FREQUENT  . Active labor 12/29/2011  . NSVD (normal spontaneous vaginal delivery) 12/29/2011   Past Surgical History  Procedure Laterality Date  . No past surgeries     Family History  Problem Relation Age of Onset  . Anesthesia problems Neg Hx   . Hypotension Neg Hx   . Malignant hyperthermia Neg Hx   . Pseudochol deficiency Neg Hx   . Diabetes Father   . Hypertension Paternal Aunt   . Diabetes Paternal Grandmother    History  Substance Use Topics  . Smoking status: Never Smoker   . Smokeless tobacco: Never Used  . Alcohol Use: No   OB History   Grav Para Term Preterm Abortions TAB SAB Ect Mult Living   3 2 2  1  1   2      Review of Systems  Constitutional:  Negative for fever.  Respiratory: Positive for shortness of breath.   Cardiovascular: Negative for chest pain.  Gastrointestinal: Positive for nausea, abdominal pain and diarrhea. Negative for vomiting and blood in stool.  Genitourinary: Negative for dysuria and hematuria.  All other systems reviewed and are negative.    Allergies  Review of patient's allergies indicates no known allergies.  Home Medications   Prior to Admission medications   Medication Sig Start Date End Date Taking? Authorizing Provider  ibuprofen (ADVIL,MOTRIN) 800 MG tablet Take 1 tablet (800 mg total) by mouth 3 (three) times daily. Take with food 07/17/13  Yes Lauren Burnetta Sabin, PA-C   BP 138/78  Pulse 88  Temp(Src) 98.6 F (37 C) (Oral)  Resp 20  SpO2 98%  LMP 08/11/2013  Physical Exam  Nursing note and vitals reviewed. Constitutional: She is oriented to person, place, and time. She appears well-developed and well-nourished. No distress.  Nontoxic/nonseptic appearing.  HENT:  Head: Normocephalic and atraumatic.  Eyes: Conjunctivae and EOM are normal. No scleral icterus.  Neck: Normal range of motion.  Cardiovascular: Normal rate, regular rhythm, normal heart sounds and intact distal pulses.   Pulmonary/Chest: Effort normal and breath sounds normal. No respiratory distress. She has no wheezes. She has no rales.  No tachypnea or dyspnea. Chest expansion symmetric.  Abdominal: Soft.  Normal appearance. She exhibits no distension and no mass. There is tenderness (mild in epigastric and R mid abdomen) in the epigastric area. There is no rebound, no guarding, no tenderness at McBurney's point and negative Murphy's sign.  Abdomen soft. No peritoneal signs.  Musculoskeletal: Normal range of motion.  Neurological: She is alert and oriented to person, place, and time.  Skin: Skin is warm and dry. No rash noted. She is not diaphoretic. No erythema. No pallor.  Psychiatric: She has a normal mood and affect. Her  behavior is normal.    ED Course  Procedures (including critical care time) Labs Review Labs Reviewed  CBC - Abnormal; Notable for the following:    WBC 13.4 (*)    MCHC 37.0 (*)    All other components within normal limits  CLOSTRIDIUM DIFFICILE BY PCR  BASIC METABOLIC PANEL  URINALYSIS, ROUTINE W REFLEX MICROSCOPIC  GI PATHOGEN PANEL BY PCR, STOOL  POC URINE PREG, ED    Imaging Review Ct Abdomen Pelvis W Contrast  09/02/2013   CLINICAL DATA:  Leukocytosis.  Nausea, vomiting and diarrhea.  EXAM: CT ABDOMEN AND PELVIS WITH CONTRAST  TECHNIQUE: Multidetector CT imaging of the abdomen and pelvis was performed using the standard protocol following bolus administration of intravenous contrast.  CONTRAST:  14mL OMNIPAQUE IOHEXOL 300 MG/ML  SOLN  COMPARISON:  Pelvic ultrasound performed 02/15/2008  FINDINGS: Trace bilateral pleural effusions are seen. Mild bibasilar atelectasis is noted.  The liver and spleen are unremarkable in appearance. The gallbladder is within normal limits. The pancreas and adrenal glands are unremarkable.  The kidneys are unremarkable in appearance. There is no evidence of hydronephrosis. No renal or ureteral stones are seen. No perinephric stranding is appreciated.  No free fluid is identified. The small bowel is unremarkable in appearance. The stomach is within normal limits. No acute vascular abnormalities are seen.  The appendix remains normal in caliber and contains air, without evidence for appendicitis. The colon is partially filled with stool and is unremarkable in appearance.  The bladder is mildly distended and grossly unremarkable in appearance. The uterus is grossly unremarkable in appearance. The ovaries are relatively symmetric; no suspicious adnexal masses are seen. No inguinal lymphadenopathy is seen.  No acute osseous abnormalities are identified.  IMPRESSION: 1. No acute abnormality seen within the abdomen or pelvis. 2. Trace bilateral pleural effusions seen,  with mild bibasilar atelectasis.   Electronically Signed   By: Garald Balding M.D.   On: 09/02/2013 06:06     EKG Interpretation None      MDM   Final diagnoses:  Abdominal pain  Diarrhea  Viral illness    23 y/o female presents for diarrhea with associated nausea and generalized abdominal cramping. Patient, while appearing uncomfortable, is nontoxic and nonseptic appearing. She is afebrile and hemodynamically stable. Patient has not produced a bowel movement in the last 6 hours, but abdominal cramping has persisted. Management was attempted with watchful waiting as labs, overall, reassuring and abdominal exams stable; however patient continued to endorse worsening pain despite treatment with IV narcotics. As a result, CT was ordered for further evaluation.  CT findings today showed no evidence of acute process. No diverticulitis or colitis. Patient stool sample ordered, but patient unable to produce sample over 5+ hour ED course. Doubt C diff as no recent abx use. No hx of recent travel. Symptoms likely viral in nature. Patient d/c'd from the ED in good condition with Rx for Zofran and Norco. Return precautions provided and patient  agreeable to plan with no unaddressed concerns.   Filed Vitals:   09/02/13 0228 09/02/13 0435 09/02/13 0734 09/02/13 0809  BP: 133/76 138/78 139/93 132/83  Pulse: 86 88 91 74  Temp:  98.6 F (37 C) 98.6 F (37 C) 98.4 F (36.9 C)  TempSrc:  Oral Oral Oral  Resp: 16 20 20 18   SpO2: 99% 98% 99% 99%       Antonietta Breach, PA-C 09/04/13 [phone]

## 2013-09-02 NOTE — ED Notes (Signed)
Pt actively vomiting at present time. Hyman Bible notified. Pt reports intermittent pain that causes pain all over abdomen.

## 2013-09-02 NOTE — ED Notes (Signed)
Pt ambulated independently to nearby restroom.

## 2013-09-02 NOTE — Discharge Instructions (Signed)
Diarrhea Diarrhea is frequent loose and watery bowel movements. It can cause you to feel weak and dehydrated. Dehydration can cause you to become tired and thirsty, have a dry mouth, and have decreased urination that often is dark yellow. Diarrhea is a sign of another problem, most often an infection that will not last long. In most cases, diarrhea typically lasts 2 3 days. However, it can last longer if it is a sign of something more serious. It is important to treat your diarrhea as directed by your caregive to lessen or prevent future episodes of diarrhea. CAUSES  Some common causes include:  Gastrointestinal infections caused by viruses, bacteria, or parasites.  Food poisoning or food allergies.  Certain medicines, such as antibiotics, chemotherapy, and laxatives.  Artificial sweeteners and fructose.  Digestive disorders. HOME CARE INSTRUCTIONS  Ensure adequate fluid intake (hydration): have 1 cup (8 oz) of fluid for each diarrhea episode. Avoid fluids that contain simple sugars or sports drinks, fruit juices, whole milk products, and sodas. Your urine should be clear or pale yellow if you are drinking enough fluids. Hydrate with an oral rehydration solution that you can purchase at pharmacies, retail stores, and online. You can prepare an oral rehydration solution at home by mixing the following ingredients together:    tsp table salt.   tsp baking soda.   tsp salt substitute containing potassium chloride.  1  tablespoons sugar.  1 L (34 oz) of water.  Certain foods and beverages may increase the speed at which food moves through the gastrointestinal (GI) tract. These foods and beverages should be avoided and include:  Caffeinated and alcoholic beverages.  High-fiber foods, such as raw fruits and vegetables, nuts, seeds, and whole grain breads and cereals.  Foods and beverages sweetened with sugar alcohols, such as xylitol, sorbitol, and mannitol.  Some foods may be well  tolerated and may help thicken stool including:  Starchy foods, such as rice, toast, pasta, low-sugar cereal, oatmeal, grits, baked potatoes, crackers, and bagels.  Bananas.  Applesauce.  Add probiotic-rich foods to help increase healthy bacteria in the GI tract, such as yogurt and fermented milk products.  Wash your hands well after each diarrhea episode.  Only take over-the-counter or prescription medicines as directed by your caregiver.  Take a warm bath to relieve any burning or pain from frequent diarrhea episodes. SEEK IMMEDIATE MEDICAL CARE IF:   You are unable to keep fluids down.  You have persistent vomiting.  You have blood in your stool, or your stools are black and tarry.  You do not urinate in 6 8 hours, or there is only a small amount of very dark urine.  You have abdominal pain that increases or localizes.  You have weakness, dizziness, confusion, or lightheadedness.  You have a severe headache.  Your diarrhea gets worse or does not get better.  You have a fever or persistent symptoms for more than 2 3 days.  You have a fever and your symptoms suddenly get worse. MAKE SURE YOU:   Understand these instructions.  Will watch your condition.  Will get help right away if you are not doing well or get worse. Document Released: 03/14/2002 Document Revised: 03/10/2012 Document Reviewed: 11/30/2011 [employer] Patient Information 2014 [employer]. Diet for Diarrhea, Adult Frequent, runny stools (diarrhea) may be caused or worsened by food or drink. Diarrhea may be relieved by changing your diet. Since diarrhea can last up to 7 days, it is easy for you to lose too much fluid   from the body and become dehydrated. Fluids that are lost need to be replaced. Along with a modified diet, make sure you drink enough fluids to keep your urine clear or pale yellow. DIET INSTRUCTIONS  Ensure adequate fluid intake (hydration): have 1 cup (8 oz) of fluid for each diarrhea  episode. Avoid fluids that contain simple sugars or sports drinks, fruit juices, whole milk products, and sodas. Your urine should be clear or pale yellow if you are drinking enough fluids. Hydrate with an oral rehydration solution that you can purchase at pharmacies, retail stores, and online. You can prepare an oral rehydration solution at home by mixing the following ingredients together:    tsp table salt.   tsp baking soda.   tsp salt substitute containing potassium chloride.  1  tablespoons sugar.  1 L (34 oz) of water.  Certain foods and beverages may increase the speed at which food moves through the gastrointestinal (GI) tract. These foods and beverages should be avoided and include:  Caffeinated and alcoholic beverages.  High-fiber foods, such as raw fruits and vegetables, nuts, seeds, and whole grain breads and cereals.  Foods and beverages sweetened with sugar alcohols, such as xylitol, sorbitol, and mannitol.  Some foods may be well tolerated and may help thicken stool including:  Starchy foods, such as rice, toast, pasta, low-sugar cereal, oatmeal, grits, baked potatoes, crackers, and bagels.   Bananas.   Applesauce.  Add probiotic-rich foods to help increase healthy bacteria in the GI tract, such as yogurt and fermented milk products. RECOMMENDED FOODS AND BEVERAGES Starches Choose foods with less than 2 g of fiber per serving.  Recommended:  White, French, and pita breads, plain rolls, buns, bagels. Plain muffins, matzo. Soda, saltine, or graham crackers. Pretzels, melba toast, zwieback. Cooked cereals made with water: cornmeal, farina, cream cereals. Dry cereals: refined corn, wheat, rice. Potatoes prepared any way without skins, refined macaroni, spaghetti, noodles, refined rice.  Avoid:  Bread, rolls, or crackers made with whole wheat, multi-grains, rye, bran seeds, nuts, or coconut. Corn tortillas or taco shells. Cereals containing whole grains, multi-grains,  bran, coconut, nuts, raisins. Cooked or dry oatmeal. Coarse wheat cereals, granola. Cereals advertised as "high-fiber." Potato skins. Whole grain pasta, wild or brown rice. Popcorn. Sweet potatoes, yams. Sweet rolls, doughnuts, waffles, pancakes, sweet breads. Vegetables  Recommended: Strained tomato and vegetable juices. Most well-cooked and canned vegetables without seeds. Fresh: Tender lettuce, cucumber without the skin, cabbage, spinach, bean sprouts.  Avoid: Fresh, cooked, or canned: Artichokes, baked beans, beet greens, broccoli, Brussels sprouts, corn, kale, legumes, peas, sweet potatoes. Cooked: Green or red cabbage, spinach. Avoid large servings of any vegetables because vegetables shrink when cooked, and they contain more fiber per serving than fresh vegetables. Fruit  Recommended: Cooked or canned: Apricots, applesauce, cantaloupe, cherries, fruit cocktail, grapefruit, grapes, kiwi, mandarin oranges, peaches, pears, plums, watermelon. Fresh: Apples without skin, ripe banana, grapes, cantaloupe, cherries, grapefruit, peaches, oranges, plums. Keep servings limited to  cup or 1 piece.  Avoid: Fresh: Apples with skin, apricots, mangoes, pears, raspberries, strawberries. Prune juice, stewed or dried prunes. Dried fruits, raisins, dates. Large servings of all fresh fruits. Protein  Recommended: Ground or well-cooked tender beef, ham, veal, lamb, pork, or poultry. Eggs. Fish, oysters, shrimp, lobster, other seafoods. Liver, organ meats.  Avoid: Tough, fibrous meats with gristle. Peanut butter, smooth or chunky. Cheese, nuts, seeds, legumes, dried peas, beans, lentils. Dairy  Recommended: Yogurt, lactose-free milk, kefir, drinkable yogurt, buttermilk, soy milk, or plain hard cheese.    Avoid: Milk, chocolate milk, beverages made with milk, such as milkshakes. Soups  Recommended: Bouillon, broth, or soups made from allowed foods. Any strained soup.  Avoid: Soups made from vegetables that  are not allowed, cream or milk-based soups. Desserts and Sweets  Recommended: Sugar-free gelatin, sugar-free frozen ice pops made without sugar alcohol.  Avoid: Plain cakes and cookies, pie made with fruit, pudding, custard, cream pie. Gelatin, fruit, ice, sherbet, frozen ice pops. Ice cream, ice milk without nuts. Plain hard candy, honey, jelly, molasses, syrup, sugar, chocolate syrup, gumdrops, marshmallows. Fats and Oils  Recommended: Limit fats to less than 8 tsp per day.  Avoid: Seeds, nuts, olives, avocados. Margarine, butter, cream, mayonnaise, salad oils, plain salad dressings. Plain gravy, crisp bacon without rind. Beverages  Recommended: Water, decaffeinated teas, oral rehydration solutions, sugar-free beverages not sweetened with sugar alcohols.  Avoid: Fruit juices, caffeinated beverages (coffee, tea, soda), alcohol, sports drinks, or lemon-lime soda. Condiments  Recommended: Ketchup, mustard, horseradish, vinegar, cocoa powder. Spices in moderation: allspice, basil, bay leaves, celery powder or leaves, cinnamon, cumin powder, curry powder, ginger, mace, marjoram, onion or garlic powder, oregano, paprika, parsley flakes, ground pepper, rosemary, sage, savory, tarragon, thyme, turmeric.  Avoid: Coconut, honey. Document Released: 06/14/2003 Document Revised: 12/17/2011 Document Reviewed: 08/08/2011 [employer] Patient Information 2014 [employer].  

## 2013-09-14 NOTE — ED Provider Notes (Signed)
Medical screening examination/treatment/procedure(s) were performed by non-physician practitioner and as supervising physician I was immediately available for consultation/collaboration.   EKG Interpretation None        Julianne Rice, MD 09/14/13 [phone]

## 2014-02-06 ENCOUNTER — Encounter (HOSPITAL_COMMUNITY): Payer: Self-pay | Admitting: Emergency Medicine

## 2014-04-11 ENCOUNTER — Emergency Department (HOSPITAL_COMMUNITY)
Admission: EM | Admit: 2014-04-11 | Discharge: 2014-04-11 | Disposition: A | Discharge status: Home / Self Care | Payer: Medicaid Other | Attending: Emergency Medicine | Admitting: Emergency Medicine

## 2014-04-11 ENCOUNTER — Emergency Department (HOSPITAL_COMMUNITY): Payer: Medicaid Other

## 2014-04-11 ENCOUNTER — Encounter (HOSPITAL_COMMUNITY): Payer: Self-pay

## 2014-04-11 DIAGNOSIS — R011 Cardiac murmur, unspecified: Secondary | ICD-10-CM | POA: Insufficient documentation

## 2014-04-11 DIAGNOSIS — R69 Illness, unspecified: Secondary | ICD-10-CM

## 2014-04-11 DIAGNOSIS — Z3202 Encounter for pregnancy test, result negative: Secondary | ICD-10-CM | POA: Insufficient documentation

## 2014-04-11 DIAGNOSIS — Z862 Personal history of diseases of the blood and blood-forming organs and certain disorders involving the immune mechanism: Secondary | ICD-10-CM | POA: Insufficient documentation

## 2014-04-11 DIAGNOSIS — Z8619 Personal history of other infectious and parasitic diseases: Secondary | ICD-10-CM | POA: Insufficient documentation

## 2014-04-11 DIAGNOSIS — R1033 Periumbilical pain: Secondary | ICD-10-CM | POA: Insufficient documentation

## 2014-04-11 DIAGNOSIS — Z791 Long term (current) use of non-steroidal anti-inflammatories (NSAID): Secondary | ICD-10-CM | POA: Insufficient documentation

## 2014-04-11 DIAGNOSIS — M419 Scoliosis, unspecified: Secondary | ICD-10-CM | POA: Insufficient documentation

## 2014-04-11 DIAGNOSIS — R109 Unspecified abdominal pain: Secondary | ICD-10-CM

## 2014-04-11 DIAGNOSIS — J111 Influenza due to unidentified influenza virus with other respiratory manifestations: Secondary | ICD-10-CM | POA: Insufficient documentation

## 2014-04-11 DIAGNOSIS — R1013 Epigastric pain: Secondary | ICD-10-CM | POA: Insufficient documentation

## 2014-04-11 DIAGNOSIS — R5381 Other malaise: Secondary | ICD-10-CM

## 2014-04-11 DIAGNOSIS — R1011 Right upper quadrant pain: Secondary | ICD-10-CM

## 2014-04-11 LAB — URINE MICROSCOPIC-ADD ON

## 2014-04-11 LAB — CBC WITH DIFFERENTIAL/PLATELET
Basophils Absolute: 0 10*3/uL (ref 0.0–0.1)
Basophils Relative: 0 % (ref 0–1)
EOS ABS: 0 10*3/uL (ref 0.0–0.7)
Eosinophils Relative: 0 % (ref 0–5)
HCT: 38.8 % (ref 36.0–46.0)
HEMOGLOBIN: 13.9 g/dL (ref 12.0–15.0)
LYMPHS PCT: 7 % — AB (ref 12–46)
Lymphs Abs: 1.6 10*3/uL (ref 0.7–4.0)
MCH: 30.3 pg (ref 26.0–34.0)
MCHC: 35.8 g/dL (ref 30.0–36.0)
MCV: 84.7 fL (ref 78.0–100.0)
Monocytes Absolute: 1 10*3/uL (ref 0.1–1.0)
Monocytes Relative: 5 % (ref 3–12)
Neutro Abs: 18.4 10*3/uL — ABNORMAL HIGH (ref 1.7–7.7)
Neutrophils Relative %: 88 % — ABNORMAL HIGH (ref 43–77)
Platelets: 254 10*3/uL (ref 150–400)
RBC: 4.58 MIL/uL (ref 3.87–5.11)
RDW: 13.1 % (ref 11.5–15.5)
WBC: 20.9 10*3/uL — AB (ref 4.0–10.5)

## 2014-04-11 LAB — URINALYSIS, ROUTINE W REFLEX MICROSCOPIC
Bilirubin Urine: NEGATIVE
Glucose, UA: NEGATIVE mg/dL
KETONES UR: NEGATIVE mg/dL
Leukocytes, UA: NEGATIVE
Nitrite: NEGATIVE
Protein, ur: NEGATIVE mg/dL
Specific Gravity, Urine: 1.013 (ref 1.005–1.030)
Urobilinogen, UA: 0.2 mg/dL (ref 0.0–1.0)
pH: 7 (ref 5.0–8.0)

## 2014-04-11 LAB — COMPREHENSIVE METABOLIC PANEL
ALT: 16 U/L (ref 0–35)
AST: 17 U/L (ref 0–37)
Albumin: 4.4 g/dL (ref 3.5–5.2)
Alkaline Phosphatase: 62 U/L (ref 39–117)
Anion gap: 7 (ref 5–15)
BUN: 12 mg/dL (ref 6–23)
CALCIUM: 9.1 mg/dL (ref 8.4–10.5)
CO2: 25 mmol/L (ref 19–32)
CREATININE: 0.54 mg/dL (ref 0.50–1.10)
Chloride: 108 mEq/L (ref 96–112)
GFR calc Af Amer: >90 mL/min (ref >90)
GLUCOSE: 96 mg/dL (ref 70–99)
Potassium: 3.6 mmol/L (ref 3.5–5.1)
Sodium: 140 mmol/L (ref 135–145)
Total Bilirubin: 0.6 mg/dL (ref 0.3–1.2)
Total Protein: 7.2 g/dL (ref 6.0–8.3)

## 2014-04-11 LAB — PREGNANCY, URINE: Preg Test, Ur: NEGATIVE

## 2014-04-11 MED ORDER — IOHEXOL 300 MG/ML  SOLN
100.0000 mL | Freq: Once | INTRAMUSCULAR | Status: AC | PRN
  Administered 2014-04-11: 100 mL via INTRAVENOUS

## 2014-04-11 MED ORDER — IBUPROFEN 800 MG PO TABS
800.0000 mg | ORAL_TABLET | Freq: Once | ORAL | Status: AC
  Administered 2014-04-11: 800 mg via ORAL
  Filled 2014-04-11: qty 1

## 2014-04-11 MED ORDER — IBUPROFEN 800 MG PO TABS: 800.0000 mg | ORAL_TABLET | Freq: Three times a day (TID) | ORAL | Status: DC

## 2014-04-11 MED ORDER — IOHEXOL 300 MG/ML  SOLN
25.0000 mL | Freq: Once | INTRAMUSCULAR | Status: AC | PRN
  Administered 2014-04-11: 25 mL via ORAL

## 2014-04-11 NOTE — Discharge Instructions (Signed)

## 2014-04-11 NOTE — ED Notes (Signed)
Pt with multiple complaints.  Started menstrual today.  Has been cramping more than normal.  Body aches.  Nausea with no vomiting.  ? Fever.  No change in urination. Diarrhea.  Chest pain starting yesterday.  Mucous at times.  "Sometimes cough"  Pt states she cannot be pregnant b/c she is not having intercourse.

## 2014-04-11 NOTE — ED Notes (Signed)
MD at bedside. 

## 2014-04-11 NOTE — ED Provider Notes (Signed)
CSN: [id number]     Arrival date & time 04/11/14  1554 History   First MD Initiated Contact with Patient 04/11/14 1648     Chief Complaint  Patient presents with  . Abdominal Pain  . Chest Pain     (Consider location/radiation/quality/duration/timing/severity/associated sxs/prior Treatment) Patient is a 24 y.o. female presenting with abdominal pain and chest pain. The history is provided by the patient.  Abdominal Pain Pain location:  Epigastric and periumbilical Pain quality: aching   Pain radiates to:  Does not radiate Pain severity:  Mild Onset quality:  Gradual Duration:  2 days Timing:  Constant Progression:  Unchanged Chronicity:  New Context: sick contacts   Relieved by:  Nothing Worsened by:  Nothing tried Associated symptoms: chest pain, cough, nausea and vomiting   Associated symptoms: no dysuria, no fever and no shortness of breath   Chest pain:    Quality:  Sharp   Severity:  Moderate   Onset quality:  Gradual   Timing:  Intermittent   Progression:  Unchanged   Chronicity:  New Chest Pain Associated symptoms: abdominal pain, cough, nausea and vomiting   Associated symptoms: no fever and no shortness of breath     Past Medical History  Diagnosis Date  . Heart murmur   . Sickle cell trait   . Anemia     FeSO4 taken  . Scoliosis   . Yeast infection     NOT FREQUENT  . Active labor 12/29/2011  . NSVD (normal spontaneous vaginal delivery) 12/29/2011   Past Surgical History  Procedure Laterality Date  . No past surgeries     Family History  Problem Relation Age of Onset  . Anesthesia problems Neg Hx   . Hypotension Neg Hx   . Malignant hyperthermia Neg Hx   . Pseudochol deficiency Neg Hx   . Diabetes Father   . Hypertension Paternal Aunt   . Diabetes Paternal Grandmother    History  Substance Use Topics  . Smoking status: Never Smoker   . Smokeless tobacco: Never Used  . Alcohol Use: No   OB History    Gravida Para Term Preterm AB TAB SAB  Ectopic Multiple Living   3 2 2  1  1   2      Review of Systems  Constitutional: Negative for fever.  Respiratory: Positive for cough. Negative for shortness of breath.   Cardiovascular: Positive for chest pain.  Gastrointestinal: Positive for nausea, vomiting and abdominal pain.  Genitourinary: Negative for dysuria and vaginal pain.  All other systems reviewed and are negative.     Allergies  Review of patient's allergies indicates no known allergies.  Home Medications   Prior to Admission medications   Medication Sig Start Date End Date Taking? Authorizing Provider  HYDROcodone-acetaminophen (NORCO/VICODIN) 5-325 MG per tablet Take 1-2 tablets by mouth every 6 (six) hours as needed for moderate pain or severe pain. 09/02/13   Antonietta Breach, PA-C  HYDROcodone-acetaminophen (NORCO/VICODIN) 5-325 MG per tablet Take 1-2 tablets by mouth every 6 (six) hours as needed. 09/02/13   Heather Laisure, PA-C  ibuprofen (ADVIL,MOTRIN) 800 MG tablet Take 1 tablet (800 mg total) by mouth 3 (three) times daily. Take with food 07/17/13   Harvie Heck, PA-C  ondansetron (ZOFRAN) 4 MG tablet Take 1 tablet (4 mg total) by mouth every 6 (six) hours. 09/02/13   Antonietta Breach, PA-C  ondansetron (ZOFRAN) 4 MG tablet Take 1 tablet (4 mg total) by mouth every 6 (six) hours. 09/02/13  Heather Laisure, PA-C   BP 142/67 mmHg  Pulse 76  Temp(Src) 99 F (37.2 C) (Oral)  Resp 18  SpO2 100%  LMP 03/08/2014 Physical Exam  Constitutional: She is oriented to person, place, and time. She appears well-developed and well-nourished. No distress.  HENT:  Head: Normocephalic and atraumatic.  Mouth/Throat: Oropharynx is clear and moist.  Eyes: EOM are normal. Pupils are equal, round, and reactive to light.  Neck: Normal range of motion. Neck supple.  Cardiovascular: Normal rate and regular rhythm.  Exam reveals no friction rub.   No murmur heard. Pulmonary/Chest: Effort normal and breath sounds normal. No respiratory  distress. She has no wheezes. She has no rales.  Abdominal: Soft. She exhibits no distension. There is tenderness (periumbilical, epigastric). There is no rebound.  Musculoskeletal: Normal range of motion. She exhibits no edema.  Neurological: She is alert and oriented to person, place, and time.  Skin: She is not diaphoretic.  Nursing note and vitals reviewed.   ED Course  Procedures (including critical care time) Labs Review Labs Reviewed  CBC WITH DIFFERENTIAL - Abnormal; Notable for the following:    WBC 20.9 (*)    Neutrophils Relative % 88 (*)    Neutro Abs 18.4 (*)    Lymphocytes Relative 7 (*)    All other components within normal limits  COMPREHENSIVE METABOLIC PANEL  URINALYSIS, ROUTINE W REFLEX MICROSCOPIC    Imaging Review Dg Chest 2 View  04/11/2014   CLINICAL DATA:  Acute onset of generalized chest pain, shortness of breath and productive cough. Initial encounter.  EXAM: CHEST  2 VIEW  COMPARISON:  Chest radiograph from 06/24/2012  FINDINGS: The lungs are well-aerated and clear. There is no evidence of focal opacification, pleural effusion or pneumothorax.  The heart is borderline normal in size; the mediastinal contour is within normal limits. No acute osseous abnormalities are seen. Contrast is noted within the renal calyces.  IMPRESSION: No acute cardiopulmonary process seen.   Electronically Signed   By: Garald Balding M.D.   On: 04/11/2014 20:58   Ct Abdomen Pelvis W Contrast  04/11/2014   CLINICAL DATA:  Right upper quadrant pain.  Nausea.  EXAM: CT ABDOMEN AND PELVIS WITH CONTRAST  TECHNIQUE: Multidetector CT imaging of the abdomen and pelvis was performed using the standard protocol following bolus administration of intravenous contrast.  CONTRAST:  31mL OMNIPAQUE IOHEXOL 300 MG/ML SOLN, 118mL OMNIPAQUE IOHEXOL 300 MG/ML SOLN  COMPARISON:  CT 09/02/2013  FINDINGS: Minimal bibasilar atelectasis, similar to prior.  The liver, gallbladder, spleen, pancreas, adrenal glands,  and kidneys are normal. There are no dilated or thickened bowel loops. The appendix is normal.  The abdominal aorta is normal in caliber. No free air, free fluid, or intra-abdominal fluid collection.  The uterus is heterogeneous, prominent in size, with questionable fibroids. There is prominent periuterine vascularity. There is dilatation of the right greater than left gonadal veins. No definite pelvic free fluid. The urinary bladder is physiologically distended. There is no pelvic adenopathy.  Mild dextroscoliotic curvature of the lumbar spine. There are no acute osseous abnormalities.  IMPRESSION: 1. Findings consistent with pelvic congestion syndrome. Question uterine fibroids. 2. Otherwise no acute abnormality.   Electronically Signed   By: Jeb Levering M.D.   On: 04/11/2014 20:18     EKG Interpretation   Date/Time:  Tuesday April 11 2014 16:25:29 EST Ventricular Rate:  86 PR Interval:  143 QRS Duration: 73 QT Interval:  349 QTC Calculation: 417 R Axis:  72 Text Interpretation:  Sinus rhythm No significant change since last  tracing Confirmed by [facility]  MD, Aime Carreras ([id number]) on 04/11/2014 4:54:15 PM      MDM   Final diagnoses:  Abdominal pain  Malaise  Influenza-like illness    16F here with 2 days of symptoms including body aches, abdominal pain, vomiting, cough, chest pain. Sick contacts at home. No fevers, no diarrhea.  On exam, periumbilical pain, no suprapubic, RLQ, LLQ pain. Lungs clear.  Labs show elevated white count at 20.9. Will scan, check CXR, urine studies. CT and CXR normal. Likely has bronchitis. Discharged.    Evelina Bucy, MD 04/11/14 [phone]

## 2014-04-11 NOTE — ED Notes (Signed)
Bed: WA03 Expected date:  Expected time:  Means of arrival:  Comments: TR 2 

## 2014-10-04 ENCOUNTER — Ambulatory Visit: Payer: Self-pay | Admitting: Physician Assistant

## 2015-05-06 ENCOUNTER — Encounter (HOSPITAL_COMMUNITY): Payer: Self-pay | Admitting: *Deleted

## 2015-05-06 ENCOUNTER — Inpatient Hospital Stay (HOSPITAL_COMMUNITY)
Admission: AD | Admit: 2015-05-06 | Discharge: 2015-05-06 | Disposition: A | Discharge status: Home / Self Care | Payer: Medicaid Other | Source: Ambulatory Visit | Attending: Obstetrics & Gynecology | Admitting: Obstetrics & Gynecology

## 2015-05-06 DIAGNOSIS — R1032 Left lower quadrant pain: Secondary | ICD-10-CM

## 2015-05-06 DIAGNOSIS — R109 Unspecified abdominal pain: Secondary | ICD-10-CM | POA: Insufficient documentation

## 2015-05-06 DIAGNOSIS — Z3202 Encounter for pregnancy test, result negative: Secondary | ICD-10-CM | POA: Insufficient documentation

## 2015-05-06 LAB — URINALYSIS, ROUTINE W REFLEX MICROSCOPIC
BILIRUBIN URINE: NEGATIVE
Glucose, UA: NEGATIVE mg/dL
Hgb urine dipstick: NEGATIVE
Ketones, ur: NEGATIVE mg/dL
LEUKOCYTES UA: NEGATIVE
NITRITE: NEGATIVE
Protein, ur: NEGATIVE mg/dL
SPECIFIC GRAVITY, URINE: 1.01 (ref 1.005–1.030)
pH: 7 (ref 5.0–8.0)

## 2015-05-06 LAB — WET PREP, GENITAL
Clue Cells Wet Prep HPF POC: NONE SEEN
SPERM: NONE SEEN
Trich, Wet Prep: NONE SEEN
YEAST WET PREP: NONE SEEN

## 2015-05-06 LAB — POCT PREGNANCY, URINE: PREG TEST UR: NEGATIVE

## 2015-05-06 MED ORDER — IBUPROFEN 800 MG PO TABS: 800.0000 mg | ORAL_TABLET | Freq: Three times a day (TID) | ORAL | Status: DC

## 2015-05-06 NOTE — MAU Note (Signed)
Pt presents to MAU with complaints of urine frequency,  pain and pressure in her pelvic area. Denies any vaginal bleeding. Reports brown discharge at times.

## 2015-05-06 NOTE — MAU Provider Note (Signed)
History     CSN: [id number]  Arrival date and time: 05/06/15 1318   First Provider Initiated Contact with Patient 05/06/15 1425      Chief Complaint  Patient presents with  . Pelvic Pain   HPI Ms. Allison Mathews is a 25 y.o. [id number] who presents to MAU today with complaint of pelvic pain and pressure. She is concerned she may have incomplete emptying of her bladder. She states this has been present x 2 weeks. She denies vaginal bleeding, discharge, UTI symptoms or fever. She states some nausea without vomiting. She also states intermittent loose stools and constipation. She states LMP 04/07/15. She is sexually active with one partner and does not use protection.   OB History    Gravida Para Term Preterm AB TAB SAB Ectopic Multiple Living   3 2 2  1  1   2       Past Medical History  Diagnosis Date  . Heart murmur   . Sickle cell trait ([place])   . Anemia     FeSO4 taken  . Scoliosis   . Yeast infection     NOT FREQUENT  . Active labor 12/29/2011  . NSVD (normal spontaneous vaginal delivery) 12/29/2011    Past Surgical History  Procedure Laterality Date  . No past surgeries      Family History  Problem Relation Age of Onset  . Anesthesia problems Neg Hx   . Hypotension Neg Hx   . Malignant hyperthermia Neg Hx   . Pseudochol deficiency Neg Hx   . Diabetes Father   . Hypertension Paternal Aunt   . Diabetes Paternal Grandmother     Social History  Substance Use Topics  . Smoking status: Never Smoker   . Smokeless tobacco: Never Used  . Alcohol Use: No    Allergies: No Known Allergies  Prescriptions prior to admission  Medication Sig Dispense Refill Last Dose  . ibuprofen (ADVIL,MOTRIN) 200 MG tablet Take 400 mg by mouth every 6 (six) hours as needed for moderate pain.   05/05/2015 at Unknown time  . HYDROcodone-acetaminophen (NORCO/VICODIN) 5-325 MG per tablet Take 1-2 tablets by mouth every 6 (six) hours as needed for moderate pain or severe pain. (Patient  not taking: Reported on 04/11/2014) 13 tablet 0 Not Taking at Unknown time  . HYDROcodone-acetaminophen (NORCO/VICODIN) 5-325 MG per tablet Take 1-2 tablets by mouth every 6 (six) hours as needed. (Patient not taking: Reported on 04/11/2014) 13 tablet 0 Not Taking at Unknown time  . ibuprofen (ADVIL,MOTRIN) 800 MG tablet Take 1 tablet (800 mg total) by mouth 3 (three) times daily. (Patient not taking: Reported on 05/06/2015) 21 tablet 0   . ibuprofen (ADVIL,MOTRIN) 800 MG tablet Take 1 tablet (800 mg total) by mouth 3 (three) times daily. Take with food (Patient not taking: Reported on 05/06/2015) 21 tablet 0   . ondansetron (ZOFRAN) 4 MG tablet Take 1 tablet (4 mg total) by mouth every 6 (six) hours. (Patient not taking: Reported on 04/11/2014) 12 tablet 0 Not Taking at Unknown time  . ondansetron (ZOFRAN) 4 MG tablet Take 1 tablet (4 mg total) by mouth every 6 (six) hours. (Patient not taking: Reported on 04/11/2014) 12 tablet 0 Not Taking at Unknown time    Review of Systems  Constitutional: Negative for fever and malaise/fatigue.  Gastrointestinal: Positive for nausea, abdominal pain and constipation. Negative for vomiting and diarrhea.  Genitourinary: Negative for dysuria, urgency and frequency.       Neg - vaginal bleeding,  discharge   Physical Exam   Blood pressure 167/83, pulse 90, temperature 98.6 F (37 C), resp. rate 18, last menstrual period 04/07/2015.  Physical Exam  Nursing note and vitals reviewed. Constitutional: She is oriented to person, place, and time. She appears well-developed and well-nourished. No distress.  HENT:  Head: Normocephalic and atraumatic.  Cardiovascular: Normal rate.   Respiratory: Effort normal.  GI: Soft. She exhibits no distension and no mass. There is tenderness (mild LLQ abdominal tenderness to palpation). There is no rebound and no guarding.  Genitourinary: Uterus is not enlarged and not tender. Cervix exhibits no motion tenderness, no discharge and no  friability. Right adnexum displays no mass and no tenderness. Left adnexum displays no mass and no tenderness. No bleeding in the vagina. Vaginal discharge (small amount of white, mucus discharge noted) found.  Neurological: She is alert and oriented to person, place, and time.  Skin: Skin is warm and dry. No erythema.  Psychiatric: She has a normal mood and affect.    Results for orders placed or performed during the hospital encounter of 05/06/15 (from the past 24 hour(s))  Urinalysis, Routine w reflex microscopic (not at [facility])     Status: None   Collection Time: 05/06/15  1:25 PM  Result Value Ref Range   Color, Urine YELLOW YELLOW   APPearance CLEAR CLEAR   Specific Gravity, Urine 1.010 1.005 - 1.030   pH 7.0 5.0 - 8.0   Glucose, UA NEGATIVE NEGATIVE mg/dL   Hgb urine dipstick NEGATIVE NEGATIVE   Bilirubin Urine NEGATIVE NEGATIVE   Ketones, ur NEGATIVE NEGATIVE mg/dL   Protein, ur NEGATIVE NEGATIVE mg/dL   Nitrite NEGATIVE NEGATIVE   Leukocytes, UA NEGATIVE NEGATIVE  Pregnancy, urine POC     Status: None   Collection Time: 05/06/15  1:33 PM  Result Value Ref Range   Preg Test, Ur NEGATIVE NEGATIVE  Wet prep, genital     Status: Abnormal   Collection Time: 05/06/15  2:30 PM  Result Value Ref Range   Yeast Wet Prep HPF POC NONE SEEN NONE SEEN   Trich, Wet Prep NONE SEEN NONE SEEN   Clue Cells Wet Prep HPF POC NONE SEEN NONE SEEN   WBC, Wet Prep HPF POC MODERATE (A) NONE SEEN   Sperm NONE SEEN     MAU Course  Procedures None  MDM UPT - negative UA, wet prep, GC/Chlamydia today  Assessment and Plan  A: Abdominal pain, appears to be GI in origin  P: Discharge home Rx for ibuprofen PRN for pain sent to patient's pharmacy Warning signs for worsening condition discussed Patient advised to follow-up with PCP for continued evaluation if pain continues Patient may return to MAU as needed or if her condition were to change or worsen  Luvenia Redden, PA-C  05/06/2015,  2:50 PM

## 2015-05-06 NOTE — Discharge Instructions (Signed)

## 2015-05-07 LAB — GC/CHLAMYDIA PROBE AMP (~~LOC~~) NOT AT ARMC
CHLAMYDIA, DNA PROBE: NEGATIVE
NEISSERIA GONORRHEA: NEGATIVE

## 2015-10-14 ENCOUNTER — Encounter (HOSPITAL_COMMUNITY): Payer: Self-pay

## 2015-10-14 ENCOUNTER — Inpatient Hospital Stay (HOSPITAL_COMMUNITY)
Admission: AD | Admit: 2015-10-14 | Discharge: 2015-10-14 | Disposition: A | Discharge status: Home / Self Care | Payer: Self-pay | Source: Ambulatory Visit | Attending: Obstetrics & Gynecology | Admitting: Obstetrics & Gynecology

## 2015-10-14 DIAGNOSIS — E860 Dehydration: Secondary | ICD-10-CM | POA: Insufficient documentation

## 2015-10-14 DIAGNOSIS — R519 Headache, unspecified: Secondary | ICD-10-CM

## 2015-10-14 DIAGNOSIS — M419 Scoliosis, unspecified: Secondary | ICD-10-CM | POA: Insufficient documentation

## 2015-10-14 DIAGNOSIS — D573 Sickle-cell trait: Secondary | ICD-10-CM | POA: Insufficient documentation

## 2015-10-14 DIAGNOSIS — R51 Headache: Secondary | ICD-10-CM | POA: Insufficient documentation

## 2015-10-14 DIAGNOSIS — R3915 Urgency of urination: Secondary | ICD-10-CM | POA: Insufficient documentation

## 2015-10-14 LAB — URINALYSIS, ROUTINE W REFLEX MICROSCOPIC
Bilirubin Urine: NEGATIVE
Glucose, UA: NEGATIVE mg/dL
Hgb urine dipstick: NEGATIVE
Ketones, ur: NEGATIVE mg/dL
LEUKOCYTES UA: NEGATIVE
NITRITE: NEGATIVE
PROTEIN: NEGATIVE mg/dL
Specific Gravity, Urine: >1.03 — ABNORMAL HIGH (ref 1.005–1.030)
pH: 6 (ref 5.0–8.0)

## 2015-10-14 LAB — POCT PREGNANCY, URINE: PREG TEST UR: NEGATIVE

## 2015-10-14 MED ORDER — IBUPROFEN 800 MG PO TABS: 800.0000 mg | ORAL_TABLET | Freq: Three times a day (TID) | ORAL | Status: DC | PRN

## 2015-10-14 NOTE — MAU Note (Signed)
Headache since yesterday and urine has odor and dark.  Also has an earache.

## 2015-10-14 NOTE — MAU Provider Note (Signed)
History     CSN: [id number]  Arrival date and time: 10/14/15 1128   First Provider Initiated Contact with Patient 10/14/15 1242      Chief Complaint  Patient presents with  . Urinary Tract Infection   HPI   Ms.Allison Mathews is a 25 y.o. female [phone] here with urgency. She urinated and noted that her urine is really dark. She feels that after she urinates she has to urinate again. She has not had anything to drink today and she has not had any po fluids. She is also here with complaints of a HA that started within the last 12 hours.  The HA feels like a  band around her head. She denies visual changes, or N/V. She has not taken anything for the HA pain today.   OB History    Gravida Para Term Preterm AB TAB SAB Ectopic Multiple Living   3 2 2  1  1   2       Past Medical History  Diagnosis Date  . Heart murmur   . Sickle cell trait ([place])   . Anemia     FeSO4 taken  . Scoliosis   . Yeast infection     NOT FREQUENT  . Active labor 12/29/2011  . NSVD (normal spontaneous vaginal delivery) 12/29/2011    Past Surgical History  Procedure Laterality Date  . No past surgeries      Family History  Problem Relation Age of Onset  . Anesthesia problems Neg Hx   . Hypotension Neg Hx   . Malignant hyperthermia Neg Hx   . Pseudochol deficiency Neg Hx   . Diabetes Father   . Hypertension Paternal Aunt   . Diabetes Paternal Grandmother     Social History  Substance Use Topics  . Smoking status: Never Smoker   . Smokeless tobacco: Never Used  . Alcohol Use: No    Allergies: No Known Allergies  Prescriptions prior to admission  Medication Sig Dispense Refill Last Dose  . ibuprofen (ADVIL,MOTRIN) 800 MG tablet Take 1 tablet (800 mg total) by mouth 3 (three) times daily. 30 tablet 0 Past Month at Unknown time  . loratadine (CLARITIN) 10 MG tablet Take 10 mg by mouth daily.   Past Month at Unknown time   Results for orders placed or performed during the hospital  encounter of 10/14/15 (from the past 48 hour(s))  Urinalysis, Routine w reflex microscopic (not at [facility])     Status: Abnormal   Collection Time: 10/14/15 11:58 AM  Result Value Ref Range   Color, Urine YELLOW YELLOW   APPearance CLEAR CLEAR   Specific Gravity, Urine >1.030 (H) 1.005 - 1.030   pH 6.0 5.0 - 8.0   Glucose, UA NEGATIVE NEGATIVE mg/dL   Hgb urine dipstick NEGATIVE NEGATIVE   Bilirubin Urine NEGATIVE NEGATIVE   Ketones, ur NEGATIVE NEGATIVE mg/dL   Protein, ur NEGATIVE NEGATIVE mg/dL   Nitrite NEGATIVE NEGATIVE   Leukocytes, UA NEGATIVE NEGATIVE    Comment: MICROSCOPIC NOT DONE ON URINES WITH NEGATIVE PROTEIN, BLOOD, LEUKOCYTES, NITRITE, OR GLUCOSE <1000 mg/dL.  Pregnancy, urine POC     Status: None   Collection Time: 10/14/15 12:04 PM  Result Value Ref Range   Preg Test, Ur NEGATIVE NEGATIVE    Comment:        THE SENSITIVITY OF THIS METHODOLOGY IS >24 mIU/mL     Review of Systems  Eyes: Negative for blurred vision.  Gastrointestinal: Negative for nausea, vomiting and abdominal pain.  Genitourinary: Positive for urgency and frequency. Negative for dysuria.  Neurological: Positive for headaches (patient requesting RX for ibuprofen ).   Physical Exam   Blood pressure 140/85, pulse 68, temperature 98.6 F (37 C), temperature source Oral, resp. rate 16, height 4\' 11"  (1.499 m), last menstrual period 10/06/2015.  Physical Exam  Constitutional: She is oriented to person, place, and time. She appears well-developed and well-nourished. No distress.  Respiratory: Effort normal.  Musculoskeletal: Normal range of motion.  Neurological: She is alert and oriented to person, place, and time. GCS eye subscore is 4. GCS verbal subscore is 5. GCS motor subscore is 6.  Skin: Skin is warm. She is not diaphoretic.  Psychiatric: Her behavior is normal.    MAU Course  Procedures  None  MDM  Patient requests Rx for ibuprofen   Assessment and Plan   A:  1. Urinary  urgency   2. Headache, unspecified headache type   3. Mild dehydration     P:  Discharge home in stable condition Increase PO fluid intake 60-80 ounces of water per day Rx: Ibuprofen Go home and eat.  If symptoms worsen go to [facility] or [facility] Return to MAU for emergencies only.    Lezlie Lye, NP 10/14/2015 1:42 PM

## 2015-10-14 NOTE — Discharge Instructions (Signed)

## 2016-05-01 ENCOUNTER — Inpatient Hospital Stay (HOSPITAL_COMMUNITY)
Admission: AD | Admit: 2016-05-01 | Discharge: 2016-05-01 | Disposition: A | Discharge status: Home / Self Care | Payer: Self-pay | Source: Ambulatory Visit | Attending: Obstetrics and Gynecology | Admitting: Obstetrics and Gynecology

## 2016-05-01 ENCOUNTER — Encounter (HOSPITAL_COMMUNITY): Payer: Self-pay | Admitting: *Deleted

## 2016-05-01 DIAGNOSIS — B373 Candidiasis of vulva and vagina: Secondary | ICD-10-CM | POA: Insufficient documentation

## 2016-05-01 DIAGNOSIS — B3731 Acute candidiasis of vulva and vagina: Secondary | ICD-10-CM

## 2016-05-01 DIAGNOSIS — L299 Pruritus, unspecified: Secondary | ICD-10-CM | POA: Insufficient documentation

## 2016-05-01 DIAGNOSIS — Z0001 Encounter for general adult medical examination with abnormal findings: Secondary | ICD-10-CM | POA: Insufficient documentation

## 2016-05-01 LAB — WET PREP, GENITAL
Clue Cells Wet Prep HPF POC: NONE SEEN
Sperm: NONE SEEN
Trich, Wet Prep: NONE SEEN

## 2016-05-01 LAB — POCT PREGNANCY, URINE: Preg Test, Ur: NEGATIVE

## 2016-05-01 LAB — URINALYSIS, ROUTINE W REFLEX MICROSCOPIC
BILIRUBIN URINE: NEGATIVE
GLUCOSE, UA: NEGATIVE mg/dL
Hgb urine dipstick: NEGATIVE
KETONES UR: NEGATIVE mg/dL
Leukocytes, UA: NEGATIVE
Nitrite: NEGATIVE
PROTEIN: NEGATIVE mg/dL
Specific Gravity, Urine: 1.01 (ref 1.005–1.030)
pH: 8 (ref 5.0–8.0)

## 2016-05-01 MED ORDER — FLUCONAZOLE 150 MG PO TABS: 150.0000 mg | ORAL_TABLET | Freq: Every day | ORAL | 1 refills | Status: DC

## 2016-05-01 NOTE — Discharge Instructions (Signed)

## 2016-05-01 NOTE — MAU Provider Note (Signed)
History     CSN: [id number]  Arrival date and time: 05/01/16 [id number]   First Provider Initiated Contact with Patient 05/01/16 2014      No chief complaint on file.  HPI Ms. Allison Mathews is a 26 y.o. [id number] who presents to MAU today with complaint of itching and burning of the vaginal area x 2 days. She has tried a baking soda bath with minimal relief. No OTC medications were tried. She denies vaginal bleeding, pelvic pain or fever.   OB History    Gravida Para Term Preterm AB Living   3 2 2   1 2    SAB TAB Ectopic Multiple Live Births   1       2      Past Medical History:  Diagnosis Date  . Active labor 12/29/2011  . Anemia    FeSO4 taken  . Heart murmur   . NSVD (normal spontaneous vaginal delivery) 12/29/2011  . Scoliosis   . Sickle cell trait ([place])   . Yeast infection    NOT FREQUENT    Past Surgical History:  Procedure Laterality Date  . NO PAST SURGERIES      Family History  Problem Relation Age of Onset  . Anesthesia problems Neg Hx   . Hypotension Neg Hx   . Malignant hyperthermia Neg Hx   . Pseudochol deficiency Neg Hx   . Diabetes Father   . Hypertension Paternal Aunt   . Diabetes Paternal Grandmother     Social History  Substance Use Topics  . Smoking status: Never Smoker  . Smokeless tobacco: Never Used  . Alcohol use No    Allergies: No Known Allergies  Prescriptions Prior to Admission  Medication Sig Dispense Refill Last Dose  . ibuprofen (ADVIL,MOTRIN) 800 MG tablet Take 1 tablet (800 mg total) by mouth every 8 (eight) hours as needed. 30 tablet 0   . loratadine (CLARITIN) 10 MG tablet Take 10 mg by mouth daily.   Past Month at Unknown time    Review of Systems  Constitutional: Negative for fever.  Gastrointestinal: Negative for abdominal pain.  Genitourinary: Positive for vaginal discharge. Negative for dysuria, frequency, urgency and vaginal bleeding.   Physical Exam   Blood pressure 140/75, pulse 69, temperature 98.4 F  (36.9 C), temperature source Oral, resp. rate 15, height 4\' 11"  (1.499 m), weight 155 lb (70.3 kg), last menstrual period 04/24/2016, SpO2 100 %.  Physical Exam  Nursing note and vitals reviewed. Constitutional: She is oriented to person, place, and time. She appears well-developed and well-nourished. No distress.  HENT:  Head: Normocephalic and atraumatic.  Cardiovascular: Normal rate.   Respiratory: Effort normal.  GI: Soft. She exhibits no distension.  Neurological: She is alert and oriented to person, place, and time.  Skin: Skin is warm and dry. No erythema.  Psychiatric: She has a normal mood and affect.   Results for orders placed or performed during the hospital encounter of 05/01/16 (from the past 24 hour(s))  Wet prep, genital     Status: Abnormal   Collection Time: 05/01/16  7:58 PM  Result Value Ref Range   Yeast Wet Prep HPF POC PRESENT (A) NONE SEEN   Trich, Wet Prep NONE SEEN NONE SEEN   Clue Cells Wet Prep HPF POC NONE SEEN NONE SEEN   WBC, Wet Prep HPF POC MANY (A) NONE SEEN   Sperm NONE SEEN   Pregnancy, urine POC     Status: None   Collection Time: 05/01/16  8:06 PM  Result Value Ref Range   Preg Test, Ur NEGATIVE NEGATIVE     MAU Course  Procedures None  MDM UPT - negative Wet prep, GC/Chlamydia, UA, RPR and HIV today   Assessment and Plan  A: Yeast vulvovaginits  P: Discharge home Rx for Diflucan given with 1 refill if symptoms persist after 3 days Warning signs for worsening condtion discussed Patient advised to follow-up with PCP of choice. Recommended [facility]. Contact information given.  Patient may return to MAU as needed or if her condition were to change or worsen  Luvenia Redden, PA-C  05/01/2016, 8:14 PM

## 2016-05-01 NOTE — MAU Note (Signed)
Pt reports she thinks she has a yeast infection because she has vaginal itching and burning.

## 2016-05-02 LAB — RPR: RPR Ser Ql: NONREACTIVE

## 2016-05-02 LAB — GC/CHLAMYDIA PROBE AMP (~~LOC~~) NOT AT ARMC
CHLAMYDIA, DNA PROBE: NEGATIVE
NEISSERIA GONORRHEA: NEGATIVE

## 2016-05-02 LAB — HIV ANTIBODY (ROUTINE TESTING W REFLEX): HIV SCREEN 4TH GENERATION: NONREACTIVE

## 2016-06-25 DIAGNOSIS — Z0100 Encounter for examination of eyes and vision without abnormal findings: Secondary | ICD-10-CM | POA: Diagnosis not present

## 2016-09-15 DIAGNOSIS — I10 Essential (primary) hypertension: Secondary | ICD-10-CM | POA: Diagnosis not present

## 2016-10-13 DIAGNOSIS — I10 Essential (primary) hypertension: Secondary | ICD-10-CM | POA: Diagnosis not present

## 2016-10-13 DIAGNOSIS — G44209 Tension-type headache, unspecified, not intractable: Secondary | ICD-10-CM | POA: Diagnosis not present

## 2016-11-17 IMAGING — CT CT ABD-PELV W/ CM
1 of 2 series · 15 of 32 positions shown, 19 images · IV contrast (omnipaque)
Comparison: CT 09/02/2013

CLINICAL DATA: Right upper quadrant pain.  Nausea.

EXAM:
CT ABDOMEN AND PELVIS WITH CONTRAST
TECHNIQUE: Multidetector CT imaging of the abdomen and pelvis was performed
using the standard protocol following bolus administration of
intravenous contrast.
CONTRAST:  25mL OMNIPAQUE IOHEXOL 300 MG/ML SOLN, 100mL OMNIPAQUE
IOHEXOL 300 MG/ML SOLN

[Series 2: abd/pel with · axial · 0.74mm/px · z∈[+951,+1321]mm · 15 of 82 slices shown, 19 images]
[im 4/82  soft-tissue]
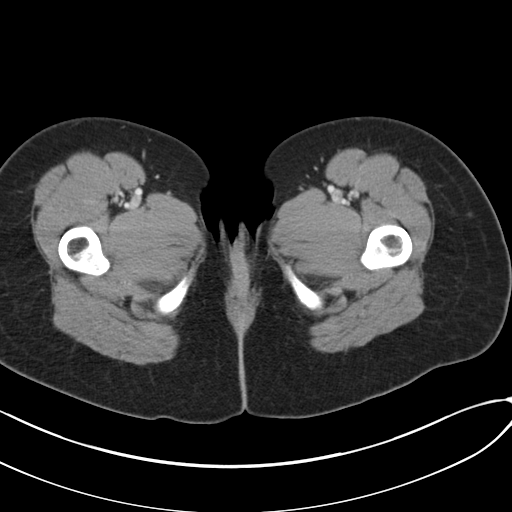
[im 4/82  bone]
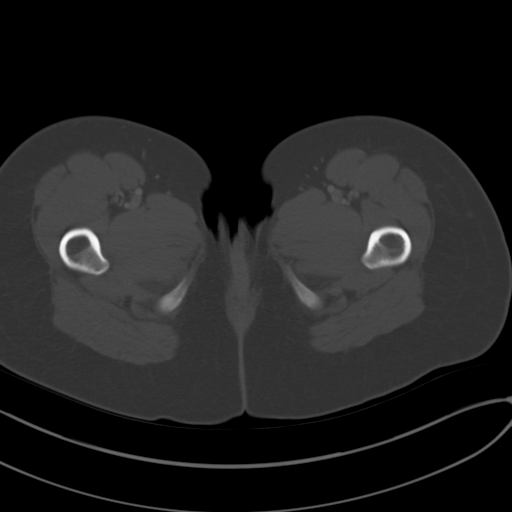
[im 11/82  soft-tissue]
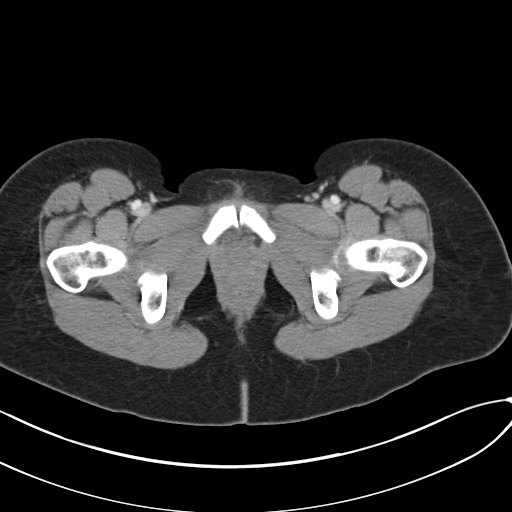
[im 18/82  soft-tissue]
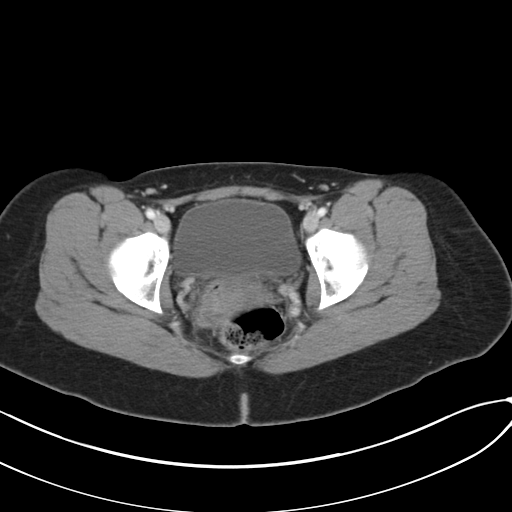
[im 22/82  soft-tissue]
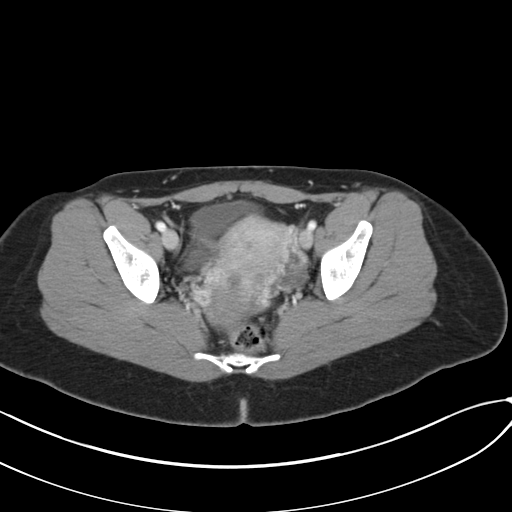
[im 29/82  soft-tissue]
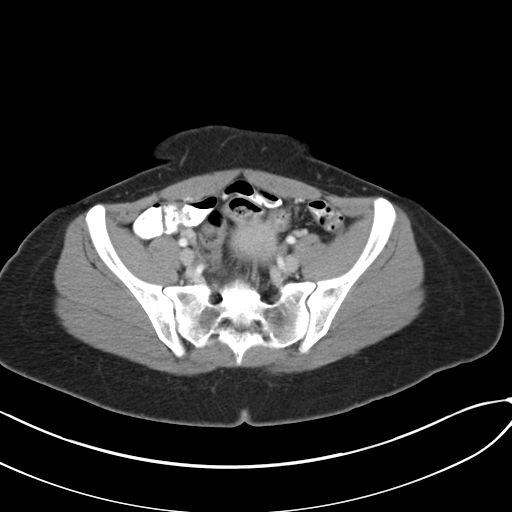
[im 36/82  soft-tissue]
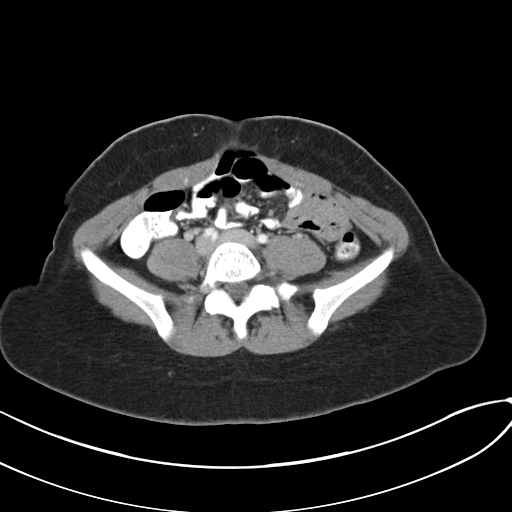
[im 43/82  soft-tissue]
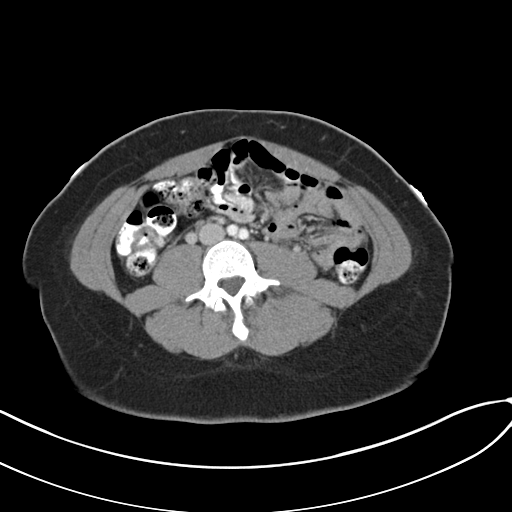
[im 46/82  soft-tissue]
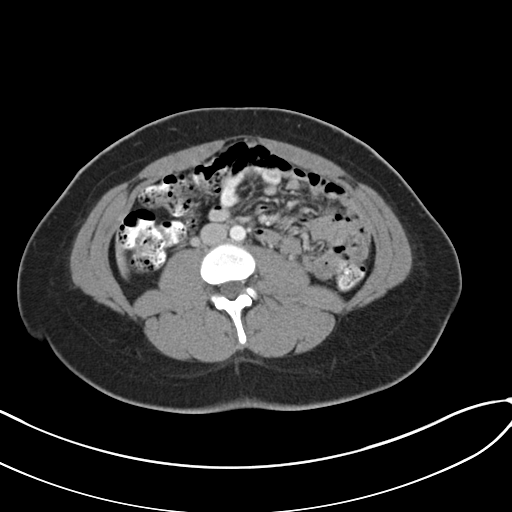
[im 53/82  soft-tissue]
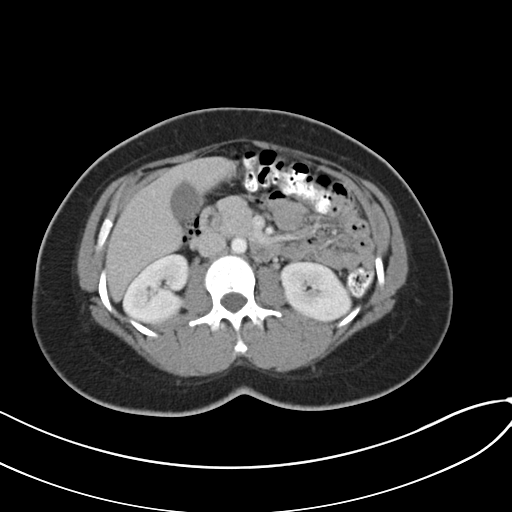
[im 53/82  bone]
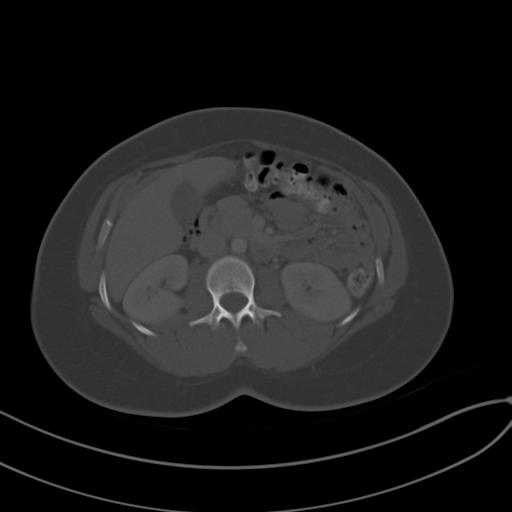
[im 60/82  soft-tissue]
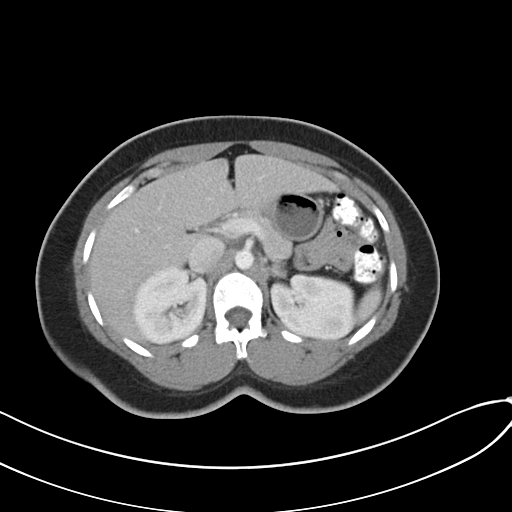
[im 64/82  soft-tissue]
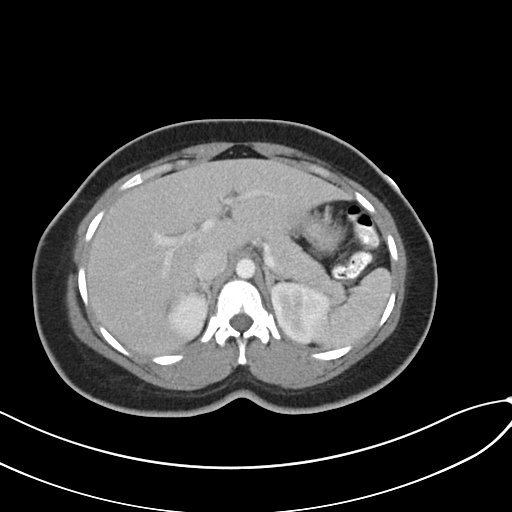
[im 67/82  lung]
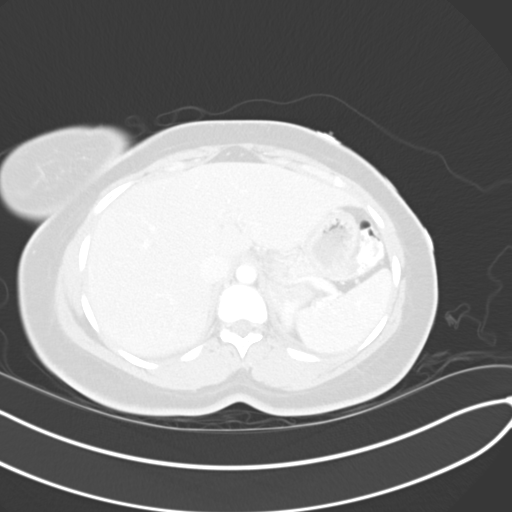
[im 71/82  soft-tissue]
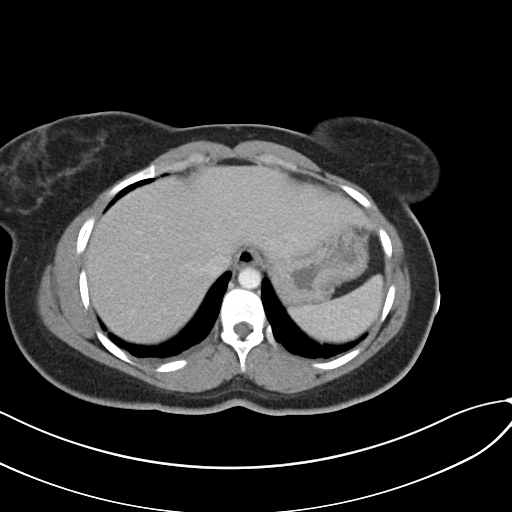
[im 71/82  lung]
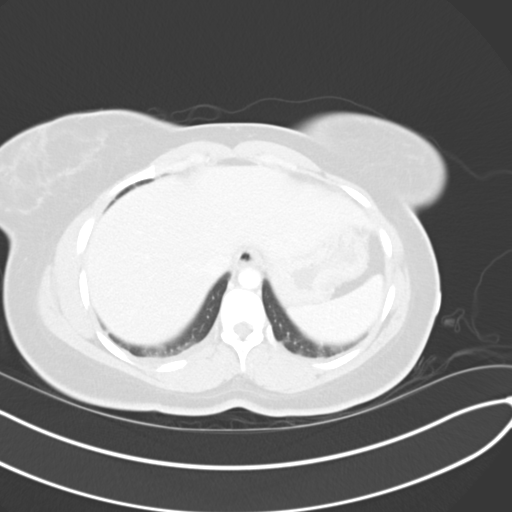
[im 74/82  lung]
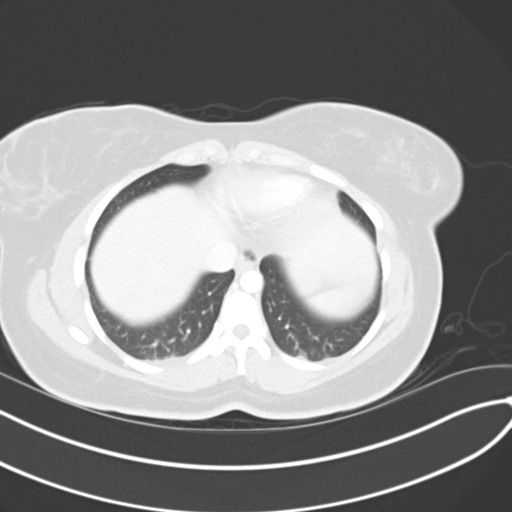
[im 78/82  soft-tissue]
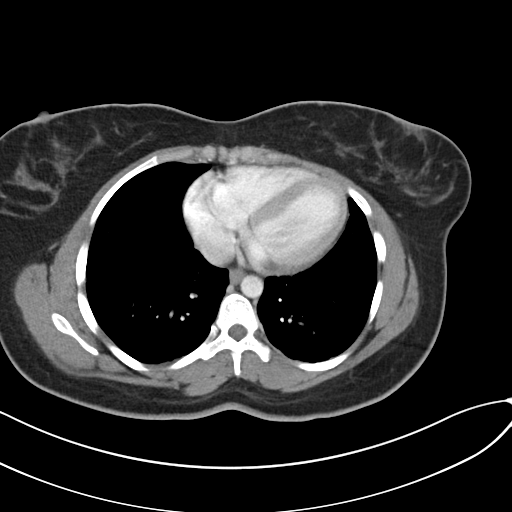
[im 78/82  lung]
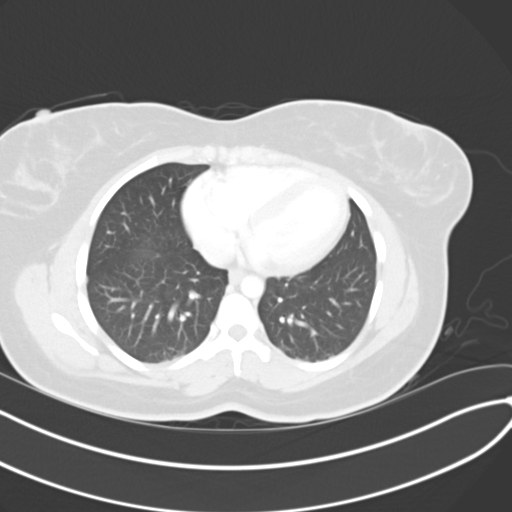

[15 of 32 positions shown; findings below may reference images not displayed]

FINDINGS: Minimal bibasilar atelectasis, similar to prior.

The liver, gallbladder, spleen, pancreas, adrenal glands, and
kidneys are normal. There are no dilated or thickened bowel loops.
The appendix is normal.

The abdominal aorta is normal in caliber. No free air, free fluid,
or intra-abdominal fluid collection.

The uterus is heterogeneous, prominent in size, with questionable
fibroids. There is prominent periuterine vascularity. There is
dilatation of the right greater than left gonadal veins. No definite
pelvic free fluid. The urinary bladder is physiologically distended.
There is no pelvic adenopathy.

Mild dextroscoliotic curvature of the lumbar spine. There are no
acute osseous abnormalities.
IMPRESSION: 1. Findings consistent with pelvic congestion syndrome. Question
uterine fibroids.
2. Otherwise no acute abnormality.

## 2017-03-23 ENCOUNTER — Inpatient Hospital Stay (HOSPITAL_COMMUNITY)
Admission: AD | Admit: 2017-03-23 | Discharge: 2017-03-24 | Disposition: A | Discharge status: Home / Self Care | Payer: Self-pay | Source: Ambulatory Visit | Attending: Obstetrics & Gynecology | Admitting: Obstetrics & Gynecology

## 2017-03-23 ENCOUNTER — Encounter (HOSPITAL_COMMUNITY): Payer: Self-pay | Admitting: *Deleted

## 2017-03-23 DIAGNOSIS — N899 Noninflammatory disorder of vagina, unspecified: Secondary | ICD-10-CM | POA: Insufficient documentation

## 2017-03-23 DIAGNOSIS — N949 Unspecified condition associated with female genital organs and menstrual cycle: Secondary | ICD-10-CM

## 2017-03-23 LAB — WET PREP, GENITAL
CLUE CELLS WET PREP: NONE SEEN
SPERM: NONE SEEN
TRICH WET PREP: NONE SEEN
YEAST WET PREP: NONE SEEN

## 2017-03-23 NOTE — MAU Provider Note (Cosign Needed)
History     CSN: [id number]  Arrival date and time: 03/23/17 2225   None     No chief complaint on file.  26 yo non-pregnant patient presents with concern for vaginal yeast infection x couple weeks. Denies current vaginal discharge or vaginal itching. Says she is not sexually active. Denies abdominal pain or vaginal bleeding. Says she came to MAU because she "just wanted to."     Past Medical History:  Diagnosis Date  . Active labor 12/29/2011  . Anemia    FeSO4 taken  . Heart murmur   . NSVD (normal spontaneous vaginal delivery) 12/29/2011  . Scoliosis   . Sickle cell trait ([place])   . Yeast infection    NOT FREQUENT    Past Surgical History:  Procedure Laterality Date  . NO PAST SURGERIES      Family History  Problem Relation Age of Onset  . Diabetes Father   . Diabetes Paternal Grandmother   . Hypertension Paternal Aunt   . Anesthesia problems Neg Hx   . Hypotension Neg Hx   . Malignant hyperthermia Neg Hx   . Pseudochol deficiency Neg Hx     Social History   Tobacco Use  . Smoking status: Never Smoker  . Smokeless tobacco: Never Used  Substance Use Topics  . Alcohol use: No  . Drug use: No    Allergies: No Known Allergies  Medications Prior to Admission  Medication Sig Dispense Refill Last Dose  . fluconazole (DIFLUCAN) 150 MG tablet Take 1 tablet (150 mg total) by mouth daily. 1 tablet 1   . ibuprofen (ADVIL,MOTRIN) 800 MG tablet Take 1 tablet (800 mg total) by mouth every 8 (eight) hours as needed. 30 tablet 0   . loratadine (CLARITIN) 10 MG tablet Take 10 mg by mouth daily.   Past Month at Unknown time    Review of Systems  Constitutional: Negative for activity change and appetite change.  HENT: Negative for congestion and dental problem.   Eyes: Negative for discharge and itching.  Respiratory: Negative for apnea and chest tightness.   Cardiovascular: Negative for chest pain and leg swelling.  Gastrointestinal: Negative for abdominal  distention and abdominal pain.  Endocrine: Negative for cold intolerance and heat intolerance.  Genitourinary: Negative for difficulty urinating, dyspareunia and dysuria.  Musculoskeletal: Negative for arthralgias and back pain.  Neurological: Negative for dizziness and light-headedness.  Hematological: Negative for adenopathy. Does not bruise/bleed easily.   Physical Exam   Blood pressure 137/72, pulse 65, temperature 98.1 F (36.7 C), temperature source Oral, resp. rate 20, height 4\' 11"  (1.499 m), weight 160 lb 4 oz (72.7 kg), last menstrual period 03/05/2017.  Physical Exam  Constitutional: She is oriented to person, place, and time. She appears well-developed and well-nourished.  HENT:  Head: Normocephalic and atraumatic.  Eyes: Conjunctivae are normal. Pupils are equal, round, and reactive to light.  Neck: Normal range of motion. Neck supple.  Cardiovascular: Normal rate and intact distal pulses.  Respiratory: Effort normal. No respiratory distress.  GI: Soft. She exhibits no distension. There is no tenderness.  Musculoskeletal: Normal range of motion. She exhibits no edema.  Neurological: She is alert and oriented to person, place, and time.  Skin: Skin is warm and dry.  Psychiatric: She has a normal mood and affect. Her behavior is normal.    MAU Course  Procedures  MDM Wet prep wnl. Gc/chlamydia pending  Assessment and Plan  1. Vaginal discomfort- follow up outpatient.  [employer] 03/23/2017, 11:57  PM  

## 2017-03-23 NOTE — MAU Note (Signed)
PT SAYS  SHE THINKS SHE HAS YEAST  INFECTION- SOMETIMES ITCHES- FEW MTHS AGO.   HAS NOT  CALLED   ABOUT  IT.    NO VAG D/C  TODAY.    LAST SEX- OCT.   NO BIRTH  CONTROL.

## 2017-03-23 NOTE — MAU Note (Signed)
Urine is in the Lab 

## 2017-03-24 DIAGNOSIS — N949 Unspecified condition associated with female genital organs and menstrual cycle: Secondary | ICD-10-CM

## 2017-03-24 LAB — GC/CHLAMYDIA PROBE AMP (~~LOC~~) NOT AT ARMC
CHLAMYDIA, DNA PROBE: NEGATIVE
Neisseria Gonorrhea: NEGATIVE

## 2017-08-31 ENCOUNTER — Encounter (HOSPITAL_COMMUNITY): Payer: Self-pay

## 2017-08-31 ENCOUNTER — Other Ambulatory Visit: Payer: Self-pay

## 2017-08-31 ENCOUNTER — Emergency Department (HOSPITAL_COMMUNITY)
Admission: EM | Admit: 2017-08-31 | Discharge: 2017-08-31 | Disposition: A | Discharge status: Home / Self Care | Payer: BLUE CROSS/BLUE SHIELD | Attending: Emergency Medicine | Admitting: Emergency Medicine

## 2017-08-31 DIAGNOSIS — R739 Hyperglycemia, unspecified: Secondary | ICD-10-CM

## 2017-08-31 DIAGNOSIS — E1165 Type 2 diabetes mellitus with hyperglycemia: Secondary | ICD-10-CM | POA: Insufficient documentation

## 2017-08-31 DIAGNOSIS — I10 Essential (primary) hypertension: Secondary | ICD-10-CM | POA: Insufficient documentation

## 2017-08-31 DIAGNOSIS — E119 Type 2 diabetes mellitus without complications: Secondary | ICD-10-CM

## 2017-08-31 DIAGNOSIS — Z79899 Other long term (current) drug therapy: Secondary | ICD-10-CM | POA: Insufficient documentation

## 2017-08-31 DIAGNOSIS — R5383 Other fatigue: Secondary | ICD-10-CM | POA: Diagnosis present

## 2017-08-31 LAB — CBC
HCT: 45 % (ref 36.0–46.0)
HEMOGLOBIN: 16.3 g/dL — AB (ref 12.0–15.0)
MCH: 29.3 pg (ref 26.0–34.0)
MCHC: 36.2 g/dL — ABNORMAL HIGH (ref 30.0–36.0)
MCV: 80.9 fL (ref 78.0–100.0)
Platelets: 247 10*3/uL (ref 150–400)
RBC: 5.56 MIL/uL — AB (ref 3.87–5.11)
RDW: 12 % (ref 11.5–15.5)
WBC: 10.1 10*3/uL (ref 4.0–10.5)

## 2017-08-31 LAB — BASIC METABOLIC PANEL
ANION GAP: 17 — AB (ref 5–15)
Anion gap: 13 (ref 5–15)
BUN: 9 mg/dL (ref 6–20)
BUN: 7 mg/dL (ref 6–20)
CHLORIDE: 98 mmol/L — AB (ref 101–111)
CHLORIDE: 106 mmol/L (ref 101–111)
CO2: 23 mmol/L (ref 22–32)
CO2: 20 mmol/L — AB (ref 22–32)
Calcium: 9.2 mg/dL (ref 8.9–10.3)
Calcium: 7.3 mg/dL — ABNORMAL LOW (ref 8.9–10.3)
Creatinine, Ser: 0.96 mg/dL (ref 0.44–1.00)
Creatinine, Ser: 0.8 mg/dL (ref 0.44–1.00)
GFR calc Af Amer: >60 mL/min (ref >60)
GFR calc non Af Amer: >60 mL/min (ref >60)
GFR calc non Af Amer: >60 mL/min (ref >60)
GLUCOSE: 335 mg/dL — AB (ref 65–99)
Glucose, Bld: 431 mg/dL — ABNORMAL HIGH (ref 65–99)
POTASSIUM: 4.1 mmol/L (ref 3.5–5.1)
Potassium: 4.2 mmol/L (ref 3.5–5.1)
SODIUM: 138 mmol/L (ref 135–145)
Sodium: 139 mmol/L (ref 135–145)

## 2017-08-31 LAB — URINALYSIS, ROUTINE W REFLEX MICROSCOPIC
BILIRUBIN URINE: NEGATIVE
Bacteria, UA: NONE SEEN
Hgb urine dipstick: NEGATIVE
KETONES UR: 80 mg/dL — AB
LEUKOCYTES UA: NEGATIVE
Nitrite: NEGATIVE
PROTEIN: NEGATIVE mg/dL
Specific Gravity, Urine: 1.033 — ABNORMAL HIGH (ref 1.005–1.030)
pH: 5 (ref 5.0–8.0)

## 2017-08-31 LAB — I-STAT BETA HCG BLOOD, ED (MC, WL, AP ONLY): I-stat hCG, quantitative: <5 m[IU]/mL (ref <5)

## 2017-08-31 MED ORDER — METFORMIN HCL 500 MG PO TABS: 500.0000 mg | ORAL_TABLET | Freq: Two times a day (BID) | ORAL | 0 refills | Status: DC

## 2017-08-31 MED ORDER — SODIUM CHLORIDE 0.9 % IV BOLUS
1000.0000 mL | Freq: Once | INTRAVENOUS | Status: AC
  Administered 2017-08-31: 1000 mL via INTRAVENOUS

## 2017-08-31 MED ORDER — CALCIUM CARBONATE ANTACID 500 MG PO CHEW
400.0000 mg | CHEWABLE_TABLET | Freq: Once | ORAL | Status: DC
  Filled 2017-08-31: qty 2

## 2017-08-31 MED ORDER — CALCIUM CARBONATE ANTACID 500 MG PO CHEW: 2.0000 | CHEWABLE_TABLET | Freq: Two times a day (BID) | ORAL | 0 refills | Status: AC

## 2017-08-31 MED ORDER — METFORMIN HCL 500 MG PO TABS
500.0000 mg | ORAL_TABLET | Freq: Once | ORAL | Status: AC
  Administered 2017-08-31: 500 mg via ORAL
  Filled 2017-08-31: qty 1

## 2017-08-31 NOTE — ED Triage Notes (Signed)
Pt presents for evaluation of generalized fatigue x 1 week. Reports is being treated for HTN but has not had any medication changes. Reports recent stress about 1 week ago that she feels may be related. States feels dehydrated. Endorses blurred vision.

## 2017-08-31 NOTE — ED Notes (Signed)
Patient ambulatory to bathroom with steady gait at this time 

## 2017-08-31 NOTE — ED Provider Notes (Signed)
[facility] EMERGENCY DEPARTMENT Provider Note   CSN: [id number] Arrival date & time: 08/31/17  1049     History   Chief Complaint Chief Complaint  Patient presents with  . Fatigue  . Hypertension    HPI Allison Mathews is a 27 y.o. female.  HPI  27 year old female with a history of hypertension presents with fatigue and blurry vision.  This is been ongoing for about 4 days.  She is had no energy and has also been having increased thirst and increased urination.  She is not had any significant headache.  The blurry vision is mild and mostly when looking at for objects.  No recent vomiting, diarrhea, chest pain, shortness of breath or abdominal pain.  No dysuria, only frequency.  She has a history of hypertension and states she is been compliant with the medicine.  No history of diabetes.  She states she is been under a lot of stress due to her grandfather.  Past Medical History:  Diagnosis Date  . Active labor 12/29/2011  . Anemia    FeSO4 taken  . Heart murmur   . NSVD (normal spontaneous vaginal delivery) 12/29/2011  . Scoliosis   . Sickle cell trait ([place])   . Yeast infection    NOT FREQUENT    Patient Active Problem List   Diagnosis Date Noted  . PP numbness/neuro sx 01/04/2012  . Scoliosis     Past Surgical History:  Procedure Laterality Date  . NO PAST SURGERIES       OB History    Gravida  3   Para  2   Term  2   Preterm      AB  1   Living  2     SAB  1   TAB      Ectopic      Multiple      Live Births  2            Home Medications    Prior to Admission medications   Medication Sig Start Date End Date Taking? Authorizing Provider  bisoprolol-hydrochlorothiazide (ZIAC) 2.5-6.25 MG tablet Take 1 tablet by mouth daily. 08/17/17  Yes [provider]  loratadine (CLARITIN) 10 MG tablet Take 10 mg by mouth daily.   Yes [provider]  calcium carbonate (TUMS) 500 MG chewable tablet Chew 2 tablets  (400 mg of elemental calcium total) by mouth 2 (two) times daily with a meal for 3 days. 08/31/17 09/03/17  Sherwood Gambler, MD  metFORMIN (GLUCOPHAGE) 500 MG tablet Take 1 tablet (500 mg total) by mouth 2 (two) times daily with a meal. 08/31/17   Sherwood Gambler, MD    Family History Family History  Problem Relation Age of Onset  . Diabetes Father   . Diabetes Paternal Grandmother   . Hypertension Paternal Aunt   . Anesthesia problems Neg Hx   . Hypotension Neg Hx   . Malignant hyperthermia Neg Hx   . Pseudochol deficiency Neg Hx     Social History Social History   Tobacco Use  . Smoking status: Never Smoker  . Smokeless tobacco: Never Used  Substance Use Topics  . Alcohol use: No  . Drug use: No     Allergies   Patient has no known allergies.   Review of Systems Review of Systems  Constitutional: Positive for fatigue.  Eyes: Positive for visual disturbance.  Respiratory: Negative for shortness of breath.   Cardiovascular: Negative for chest pain.  Gastrointestinal:  Negative for abdominal pain and vomiting.  Genitourinary: Positive for frequency. Negative for dysuria.  Neurological: Negative for light-headedness and headaches.  All other systems reviewed and are negative.    Physical Exam Updated Vital Signs BP 121/74 (BP Location: Right Arm)   Pulse 87   Temp 98.3 F (36.8 C) (Oral)   Resp 18   LMP 08/17/2017 (Exact Date)   SpO2 99%   Physical Exam  Constitutional: She is oriented to person, place, and time. She appears well-developed and well-nourished. No distress.  HENT:  Head: Normocephalic and atraumatic.  Right Ear: External ear normal.  Left Ear: External ear normal.  Nose: Nose normal.  Eyes: Pupils are equal, round, and reactive to light. EOM are normal. Right eye exhibits no discharge. Left eye exhibits no discharge.  Neck: Neck supple.  Cardiovascular: Normal rate, regular rhythm and normal heart sounds.  Pulmonary/Chest: Effort normal and  breath sounds normal.  Abdominal: Soft. She exhibits no distension. There is no tenderness.  Neurological: She is alert and oriented to person, place, and time.  CN 3-12 grossly intact. 5/5 strength in all 4 extremities. Grossly normal sensation. Normal finger to nose.   Skin: Skin is warm and dry. She is not diaphoretic.  Nursing note and vitals reviewed.    ED Treatments / Results  Labs (all labs ordered are listed, but only abnormal results are displayed) Labs Reviewed  BASIC METABOLIC PANEL - Abnormal; Notable for the following components:      Result Value   Chloride 98 (*)    Glucose, Bld 431 (*)    Anion gap 17 (*)    All other components within normal limits  CBC - Abnormal; Notable for the following components:   RBC 5.56 (*)    Hemoglobin 16.3 (*)    MCHC 36.2 (*)    All other components within normal limits  URINALYSIS, ROUTINE W REFLEX MICROSCOPIC - Abnormal; Notable for the following components:   Color, Urine STRAW (*)    Specific Gravity, Urine 1.033 (*)    Glucose, UA >=500 (*)    Ketones, ur 80 (*)    All other components within normal limits  BASIC METABOLIC PANEL - Abnormal; Notable for the following components:   CO2 20 (*)    Glucose, Bld 335 (*)    Calcium 7.3 (*)    All other components within normal limits  I-STAT BETA HCG BLOOD, ED ([facility], [facility], [facility] ONLY)    EKG None  Radiology No results found.  Procedures Procedures (including critical care time)  Medications Ordered in ED Medications  calcium carbonate (TUMS - dosed in mg elemental calcium) chewable tablet 400 mg of elemental calcium (has no administration in time range)  sodium chloride 0.9 % bolus 1,000 mL (0 mLs Intravenous Stopped 08/31/17 1319)  sodium chloride 0.9 % bolus 1,000 mL (0 mLs Intravenous Stopped 08/31/17 1400)  metFORMIN (GLUCOPHAGE) tablet 500 mg (500 mg Oral Given 08/31/17 1218)     Initial Impression / Assessment and Plan / ED Course  I have reviewed the triage vital  signs and the nursing notes.  Pertinent labs & imaging results that were available during my care of the patient were reviewed by me and considered in my medical decision making (see chart for details).     Patient's presentation and work-up consistent with new onset diabetes with hyperglycemia.  Her initial lab work shows mild anion gap of 17 but her bicarbonate is 23.  She does have ketones in the urine.  She was given IV fluids and feels much better.  Repeat BMP obtained which shows improvement in anion gap down to 13.  The correlating bicarbonate of 20 is likely because her chloride came up significantly with the normal saline.  Her calcium is noted to have decreased, likely partially from the extra fluid given.  She is not symptomatic on recheck.  I will give her oral calcium to help with this.  We discussed she will need diet and exercise changes and will be started on metformin as well.  She is to follow-up closely with her doctor but I think that she does not need to come into the hospital at this time.  Her vital signs have normalized.  We discussed return precautions.  Final Clinical Impressions(s) / ED Diagnoses   Final diagnoses:  Hyperglycemia  New onset type 2 diabetes mellitus ([place])  Hypocalcemia    ED Discharge Orders        Ordered    calcium carbonate (TUMS) 500 MG chewable tablet  2 times daily with meals     08/31/17 1548    metFORMIN (GLUCOPHAGE) 500 MG tablet  2 times daily with meals     08/31/17 1548       Sherwood Gambler, MD 08/31/17 1605

## 2017-08-31 NOTE — ED Notes (Signed)
Patient ambulatory to bathroom with steady gait at this time to provide urine sample 

## 2017-08-31 NOTE — ED Notes (Signed)
Pt educated on new onset diabetes and healthy foods/ exercise plans to reduce risk factors

## 2017-08-31 NOTE — ED Notes (Signed)
Patient verbalizes understanding of discharge instructions. Opportunity for questioning and answers were provided. Armband removed by staff, pt discharged from ED ambulatory with female companion.  

## 2017-08-31 NOTE — ED Notes (Signed)
Pt states her blurred vision has gone away

## 2017-09-15 DIAGNOSIS — E119 Type 2 diabetes mellitus without complications: Secondary | ICD-10-CM | POA: Diagnosis not present

## 2017-09-15 DIAGNOSIS — I10 Essential (primary) hypertension: Secondary | ICD-10-CM | POA: Diagnosis not present

## 2017-10-15 DIAGNOSIS — E1165 Type 2 diabetes mellitus with hyperglycemia: Secondary | ICD-10-CM | POA: Diagnosis not present

## 2017-10-15 DIAGNOSIS — I10 Essential (primary) hypertension: Secondary | ICD-10-CM | POA: Diagnosis not present

## 2019-07-12 DIAGNOSIS — H40033 Anatomical narrow angle, bilateral: Secondary | ICD-10-CM | POA: Diagnosis not present

## 2019-07-12 DIAGNOSIS — E119 Type 2 diabetes mellitus without complications: Secondary | ICD-10-CM | POA: Diagnosis not present

## 2019-07-13 DIAGNOSIS — J302 Other seasonal allergic rhinitis: Secondary | ICD-10-CM | POA: Diagnosis not present

## 2019-07-13 DIAGNOSIS — H1013 Acute atopic conjunctivitis, bilateral: Secondary | ICD-10-CM | POA: Diagnosis not present

## 2019-07-13 DIAGNOSIS — E1165 Type 2 diabetes mellitus with hyperglycemia: Secondary | ICD-10-CM | POA: Diagnosis not present

## 2019-07-13 DIAGNOSIS — E1121 Type 2 diabetes mellitus with diabetic nephropathy: Secondary | ICD-10-CM | POA: Diagnosis not present

## 2019-11-30 DIAGNOSIS — Z1151 Encounter for screening for human papillomavirus (HPV): Secondary | ICD-10-CM | POA: Diagnosis not present

## 2019-11-30 DIAGNOSIS — B373 Candidiasis of vulva and vagina: Secondary | ICD-10-CM | POA: Diagnosis not present

## 2019-11-30 DIAGNOSIS — Z01419 Encounter for gynecological examination (general) (routine) without abnormal findings: Secondary | ICD-10-CM | POA: Diagnosis not present

## 2019-11-30 DIAGNOSIS — Z114 Encounter for screening for human immunodeficiency virus [HIV]: Secondary | ICD-10-CM | POA: Diagnosis not present

## 2019-11-30 DIAGNOSIS — Z6831 Body mass index (BMI) 31.0-31.9, adult: Secondary | ICD-10-CM | POA: Diagnosis not present

## 2019-11-30 DIAGNOSIS — Z113 Encounter for screening for infections with a predominantly sexual mode of transmission: Secondary | ICD-10-CM | POA: Diagnosis not present

## 2019-11-30 DIAGNOSIS — Z1159 Encounter for screening for other viral diseases: Secondary | ICD-10-CM | POA: Diagnosis not present

## 2020-04-06 DIAGNOSIS — U071 COVID-19: Secondary | ICD-10-CM | POA: Diagnosis not present

## 2020-04-07 DIAGNOSIS — E119 Type 2 diabetes mellitus without complications: Secondary | ICD-10-CM

## 2020-04-07 HISTORY — DX: Type 2 diabetes mellitus without complications: E11.9

## 2020-04-17 DIAGNOSIS — E1165 Type 2 diabetes mellitus with hyperglycemia: Secondary | ICD-10-CM | POA: Diagnosis not present

## 2020-04-17 DIAGNOSIS — U099 Post covid-19 condition, unspecified: Secondary | ICD-10-CM | POA: Diagnosis not present

## 2020-04-17 DIAGNOSIS — E119 Type 2 diabetes mellitus without complications: Secondary | ICD-10-CM | POA: Diagnosis not present

## 2020-04-17 DIAGNOSIS — I10 Essential (primary) hypertension: Secondary | ICD-10-CM | POA: Diagnosis not present

## 2020-04-17 DIAGNOSIS — Z2821 Immunization not carried out because of patient refusal: Secondary | ICD-10-CM | POA: Diagnosis not present

## 2020-04-18 DIAGNOSIS — Z5321 Procedure and treatment not carried out due to patient leaving prior to being seen by health care provider: Secondary | ICD-10-CM | POA: Diagnosis not present

## 2020-06-20 DIAGNOSIS — Z0001 Encounter for general adult medical examination with abnormal findings: Secondary | ICD-10-CM | POA: Diagnosis not present

## 2020-06-20 DIAGNOSIS — E559 Vitamin D deficiency, unspecified: Secondary | ICD-10-CM | POA: Diagnosis not present

## 2020-06-20 DIAGNOSIS — E782 Mixed hyperlipidemia: Secondary | ICD-10-CM | POA: Diagnosis not present

## 2020-06-20 DIAGNOSIS — B373 Candidiasis of vulva and vagina: Secondary | ICD-10-CM | POA: Diagnosis not present

## 2020-06-20 DIAGNOSIS — I10 Essential (primary) hypertension: Secondary | ICD-10-CM | POA: Diagnosis not present

## 2020-06-20 DIAGNOSIS — N390 Urinary tract infection, site not specified: Secondary | ICD-10-CM | POA: Diagnosis not present

## 2020-06-20 DIAGNOSIS — E538 Deficiency of other specified B group vitamins: Secondary | ICD-10-CM | POA: Diagnosis not present

## 2020-06-20 DIAGNOSIS — E118 Type 2 diabetes mellitus with unspecified complications: Secondary | ICD-10-CM | POA: Diagnosis not present

## 2020-07-16 DIAGNOSIS — I10 Essential (primary) hypertension: Secondary | ICD-10-CM | POA: Diagnosis not present

## 2020-07-16 DIAGNOSIS — E1140 Type 2 diabetes mellitus with diabetic neuropathy, unspecified: Secondary | ICD-10-CM | POA: Diagnosis not present

## 2020-07-16 DIAGNOSIS — E1121 Type 2 diabetes mellitus with diabetic nephropathy: Secondary | ICD-10-CM | POA: Diagnosis not present

## 2020-07-16 DIAGNOSIS — E782 Mixed hyperlipidemia: Secondary | ICD-10-CM | POA: Diagnosis not present

## 2020-07-16 DIAGNOSIS — E118 Type 2 diabetes mellitus with unspecified complications: Secondary | ICD-10-CM | POA: Diagnosis not present

## 2020-07-16 DIAGNOSIS — E1142 Type 2 diabetes mellitus with diabetic polyneuropathy: Secondary | ICD-10-CM | POA: Diagnosis not present

## 2020-07-17 ENCOUNTER — Encounter (INDEPENDENT_AMBULATORY_CARE_PROVIDER_SITE_OTHER): Payer: BC Managed Care – PPO | Admitting: Ophthalmology

## 2020-07-17 ENCOUNTER — Encounter (INDEPENDENT_AMBULATORY_CARE_PROVIDER_SITE_OTHER): Payer: BLUE CROSS/BLUE SHIELD | Admitting: Ophthalmology

## 2020-11-09 DIAGNOSIS — E1165 Type 2 diabetes mellitus with hyperglycemia: Secondary | ICD-10-CM | POA: Diagnosis not present

## 2020-11-09 DIAGNOSIS — E7800 Pure hypercholesterolemia, unspecified: Secondary | ICD-10-CM | POA: Diagnosis not present

## 2020-11-09 DIAGNOSIS — I10 Essential (primary) hypertension: Secondary | ICD-10-CM | POA: Diagnosis not present

## 2020-11-09 DIAGNOSIS — Z794 Long term (current) use of insulin: Secondary | ICD-10-CM | POA: Diagnosis not present

## 2020-11-19 DIAGNOSIS — E1165 Type 2 diabetes mellitus with hyperglycemia: Secondary | ICD-10-CM | POA: Diagnosis not present

## 2020-11-28 DIAGNOSIS — R7309 Other abnormal glucose: Secondary | ICD-10-CM | POA: Diagnosis not present

## 2020-12-13 ENCOUNTER — Emergency Department (HOSPITAL_COMMUNITY): Payer: BC Managed Care – PPO

## 2020-12-13 ENCOUNTER — Emergency Department (HOSPITAL_COMMUNITY)
Admission: EM | Admit: 2020-12-13 | Discharge: 2020-12-13 | Disposition: A | Discharge status: Home / Self Care | Payer: BC Managed Care – PPO | Attending: Emergency Medicine | Admitting: Emergency Medicine

## 2020-12-13 ENCOUNTER — Encounter (HOSPITAL_COMMUNITY): Payer: Self-pay

## 2020-12-13 ENCOUNTER — Other Ambulatory Visit: Payer: Self-pay

## 2020-12-13 DIAGNOSIS — N9489 Other specified conditions associated with female genital organs and menstrual cycle: Secondary | ICD-10-CM | POA: Diagnosis not present

## 2020-12-13 DIAGNOSIS — U071 COVID-19: Secondary | ICD-10-CM | POA: Diagnosis not present

## 2020-12-13 DIAGNOSIS — I10 Essential (primary) hypertension: Secondary | ICD-10-CM | POA: Diagnosis not present

## 2020-12-13 DIAGNOSIS — R079 Chest pain, unspecified: Secondary | ICD-10-CM | POA: Diagnosis not present

## 2020-12-13 DIAGNOSIS — Z79899 Other long term (current) drug therapy: Secondary | ICD-10-CM | POA: Diagnosis not present

## 2020-12-13 DIAGNOSIS — R0789 Other chest pain: Secondary | ICD-10-CM

## 2020-12-13 DIAGNOSIS — R0602 Shortness of breath: Secondary | ICD-10-CM | POA: Diagnosis not present

## 2020-12-13 DIAGNOSIS — R519 Headache, unspecified: Secondary | ICD-10-CM | POA: Diagnosis not present

## 2020-12-13 HISTORY — DX: Pure hypercholesterolemia, unspecified: E78.00

## 2020-12-13 HISTORY — DX: Essential (primary) hypertension: I10

## 2020-12-13 LAB — BASIC METABOLIC PANEL
Anion gap: 12 (ref 5–15)
BUN: 9 mg/dL (ref 6–20)
CO2: 24 mmol/L (ref 22–32)
Calcium: 8.5 mg/dL — ABNORMAL LOW (ref 8.9–10.3)
Chloride: 99 mmol/L (ref 98–111)
Creatinine, Ser: 0.56 mg/dL (ref 0.44–1.00)
GFR, Estimated: >60 mL/min (ref >60)
Glucose, Bld: 244 mg/dL — ABNORMAL HIGH (ref 70–99)
Potassium: 3.5 mmol/L (ref 3.5–5.1)
Sodium: 135 mmol/L (ref 135–145)

## 2020-12-13 LAB — CBC
HCT: 40.5 % (ref 36.0–46.0)
Hemoglobin: 14.9 g/dL (ref 12.0–15.0)
MCH: 29.7 pg (ref 26.0–34.0)
MCHC: 36.8 g/dL — ABNORMAL HIGH (ref 30.0–36.0)
MCV: 80.7 fL (ref 80.0–100.0)
Platelets: 236 10*3/uL (ref 150–400)
RBC: 5.02 MIL/uL (ref 3.87–5.11)
RDW: 13 % (ref 11.5–15.5)
WBC: 9.3 10*3/uL (ref 4.0–10.5)
nRBC: 0 % (ref 0.0–0.2)

## 2020-12-13 LAB — I-STAT BETA HCG BLOOD, ED (MC, WL, AP ONLY): I-stat hCG, quantitative: <5 m[IU]/mL (ref <5)

## 2020-12-13 LAB — POC SARS CORONAVIRUS 2 AG -  ED: SARSCOV2ONAVIRUS 2 AG: POSITIVE — AB

## 2020-12-13 LAB — TROPONIN I (HIGH SENSITIVITY)
Troponin I (High Sensitivity): <2 ng/L (ref <18)
Troponin I (High Sensitivity): <2 ng/L (ref <18)

## 2020-12-13 MED ORDER — SODIUM CHLORIDE 0.9 % IV BOLUS
1000.0000 mL | Freq: Once | INTRAVENOUS | Status: AC
  Administered 2020-12-13: 1000 mL via INTRAVENOUS

## 2020-12-13 NOTE — ED Notes (Signed)
Pt urine specimen/UA obtained, placed in triage urine specimen bin, awaiting order if needed.

## 2020-12-13 NOTE — Discharge Instructions (Addendum)
Drink plenty of fluids.  Take Tylenol for aches and fever and follow-up with your family doctor in the next few weeks for recheck

## 2020-12-13 NOTE — ED Provider Notes (Signed)
[facility] DEPT Provider Note   CSN: [id number] Arrival date & time: 12/13/20  0810     History Chief Complaint  Patient presents with   Chest Pain    Allison Mathews is a 30 y.o. female.  Patient states her heart feels funny and has been going on for weeks.  But she also has cold symptoms been going on for 4 to 5 days no fever no chills.  Patient states she does not have chest pain or palpitations but she feels like her heart feels funny  The history is provided by the patient and medical records. No language interpreter was used.  Weakness Severity:  Mild Onset quality:  Gradual Timing:  Intermittent Progression:  Waxing and waning Chronicity:  Recurrent Context: not alcohol use   Relieved by:  Nothing Worsened by:  Nothing Ineffective treatments:  None tried Associated symptoms: no abdominal pain, no chest pain, no cough, no diarrhea, no frequency, no headaches and no seizures       Past Medical History:  Diagnosis Date   Active labor 12/29/2011   Anemia    FeSO4 taken   Heart murmur    Hypercholesteremia    Hypertension    NSVD (normal spontaneous vaginal delivery) 12/29/2011   Scoliosis    Sickle cell trait ([place])    Yeast infection    NOT FREQUENT    Patient Active Problem List   Diagnosis Date Noted   PP numbness/neuro sx 01/04/2012   Scoliosis     Past Surgical History:  Procedure Laterality Date   NO PAST SURGERIES       OB History     Gravida  3   Para  2   Term  2   Preterm      AB  1   Living  2      SAB  1   IAB      Ectopic      Multiple      Live Births  2           Family History  Problem Relation Age of Onset   Diabetes Father    Diabetes Paternal Grandmother    Hypertension Paternal Aunt    Anesthesia problems Neg Hx    Hypotension Neg Hx    Malignant hyperthermia Neg Hx    Pseudochol deficiency Neg Hx     Social History   Tobacco Use   Smoking status: Never    Smokeless tobacco: Never  Substance Use Topics   Alcohol use: No   Drug use: No    Home Medications Prior to Admission medications   Medication Sig Start Date End Date Taking? Authorizing Provider  bisoprolol-hydrochlorothiazide (ZIAC) 2.5-6.25 MG tablet Take 1 tablet by mouth daily. 08/17/17   [provider]  loratadine (CLARITIN) 10 MG tablet Take 10 mg by mouth daily.    [provider]  metFORMIN (GLUCOPHAGE) 500 MG tablet Take 1 tablet (500 mg total) by mouth 2 (two) times daily with a meal. 08/31/17   Sherwood Gambler, MD    Allergies    Patient has no known allergies.  Review of Systems   Review of Systems  Constitutional:  Negative for appetite change and fatigue.  HENT:  Negative for congestion, ear discharge and sinus pressure.        Nasal congestion  Eyes:  Negative for discharge.  Respiratory:  Negative for cough.   Cardiovascular:  Negative for chest pain.  Heart uncomfortable  Gastrointestinal:  Negative for abdominal pain and diarrhea.  Genitourinary:  Negative for frequency and hematuria.  Musculoskeletal:  Negative for back pain.  Skin:  Negative for rash.  Neurological:  Positive for weakness. Negative for seizures and headaches.  Psychiatric/Behavioral:  Negative for hallucinations.    Physical Exam Updated Vital Signs BP (!) 147/91 (BP Location: Left Arm)   Pulse 97   Temp 99.1 F (37.3 C) (Oral)   Resp 18   Ht '4\' 11"'$  (1.499 m) Comment: Simultaneous filing. User may not have seen previous data.  Wt 70.8 kg Comment: Simultaneous filing. User may not have seen previous data.  SpO2 97%   BMI 31.51 kg/m   Physical Exam Vitals and nursing note reviewed.  Constitutional:      Appearance: She is well-developed.  HENT:     Head: Normocephalic.     Nose: Nose normal.  Eyes:     General: No scleral icterus.    Conjunctiva/sclera: Conjunctivae normal.  Neck:     Thyroid: No thyromegaly.  Cardiovascular:     Rate and Rhythm:  Normal rate and regular rhythm.     Heart sounds: No murmur heard.   No friction rub. No gallop.  Pulmonary:     Breath sounds: No stridor. No wheezing or rales.  Chest:     Chest wall: No tenderness.  Abdominal:     General: There is no distension.     Tenderness: There is no abdominal tenderness. There is no rebound.  Musculoskeletal:        General: Normal range of motion.     Cervical back: Neck supple.  Lymphadenopathy:     Cervical: No cervical adenopathy.  Skin:    Findings: No erythema or rash.  Neurological:     Mental Status: She is alert and oriented to person, place, and time.     Motor: No abnormal muscle tone.     Coordination: Coordination normal.  Psychiatric:        Behavior: Behavior normal.    ED Results / Procedures / Treatments   Labs (all labs ordered are listed, but only abnormal results are displayed) Labs Reviewed  BASIC METABOLIC PANEL - Abnormal; Notable for the following components:      Result Value   Glucose, Bld 244 (*)    Calcium 8.5 (*)    All other components within normal limits  CBC - Abnormal; Notable for the following components:   MCHC 36.8 (*)    All other components within normal limits  POC SARS CORONAVIRUS 2 AG -  ED - Abnormal; Notable for the following components:   SARSCOV2ONAVIRUS 2 AG POSITIVE (*)    All other components within normal limits  I-STAT BETA HCG BLOOD, ED ([facility], [facility], [facility] ONLY)  TROPONIN I (HIGH SENSITIVITY)  TROPONIN I (HIGH SENSITIVITY)    EKG EKG Interpretation  Date/Time:  Thursday December 13 2020 08:27:40 EDT Ventricular Rate:  89 PR Interval:  151 QRS Duration: 71 QT Interval:  338 QTC Calculation: 412 R Axis:   66 Text Interpretation: Sinus rhythm Confirmed by Milton Ferguson [phone]) on 12/13/2020 11:18:55 AM  Radiology DG Chest 2 View  Result Date: 12/13/2020 CLINICAL DATA:  Chest heaviness, shortness of breath, headache EXAM: CHEST - 2 VIEW COMPARISON:  Chest radiograph 04/11/2014 FINDINGS: The  cardiomediastinal silhouette is normal. Lung volumes are somewhat low. There is no focal consolidation or pulmonary edema. There is no pleural effusion or pneumothorax. There is slight levoscoliosis of the lower  thoracic spine, unchanged. There is no acute osseous abnormality. IMPRESSION: Low lung volumes. Otherwise, no radiographic evidence of acute cardiopulmonary process. Electronically Signed   By: Valetta Mole M.D.   On: 12/13/2020 09:02    Procedures Procedures   Medications Ordered in ED Medications  sodium chloride 0.9 % bolus 1,000 mL (1,000 mLs Intravenous New Bag/Given 12/13/20 1005)    ED Course  I have reviewed the triage vital signs and the nursing notes.  Pertinent labs & imaging results that were available during my care of the patient were reviewed by me and considered in my medical decision making (see chart for details).    MDM Rules/Calculators/A&P                           Patient with positive COVID test.  Patient did not want any antiviral medication.  Chest discomfort unlikely coronary artery disease.  Labs unremarkable.  She is told to follow-up with her PCP in the next couple weeks Final Clinical Impression(s) / ED Diagnoses Final diagnoses:  Atypical chest pain  COVID-19    Rx / DC Orders ED Discharge Orders     None        Milton Ferguson, MD 12/13/20 1129

## 2020-12-13 NOTE — ED Triage Notes (Signed)
Pt reports heaviness to chest x2 weeks started having shob and headache 2 days ago.

## 2020-12-13 NOTE — ED Notes (Signed)
Urine specimen placed at bedside.

## 2020-12-13 NOTE — ED Notes (Signed)
Pt ambulatory in ED lobby. 

## 2021-01-21 DIAGNOSIS — Z01419 Encounter for gynecological examination (general) (routine) without abnormal findings: Secondary | ICD-10-CM | POA: Diagnosis not present

## 2021-01-21 DIAGNOSIS — N898 Other specified noninflammatory disorders of vagina: Secondary | ICD-10-CM | POA: Diagnosis not present

## 2021-01-21 DIAGNOSIS — N760 Acute vaginitis: Secondary | ICD-10-CM | POA: Diagnosis not present

## 2021-01-21 DIAGNOSIS — Z6831 Body mass index (BMI) 31.0-31.9, adult: Secondary | ICD-10-CM | POA: Diagnosis not present

## 2021-01-21 DIAGNOSIS — R350 Frequency of micturition: Secondary | ICD-10-CM | POA: Diagnosis not present

## 2021-02-14 DIAGNOSIS — I10 Essential (primary) hypertension: Secondary | ICD-10-CM | POA: Diagnosis not present

## 2021-02-14 DIAGNOSIS — E1165 Type 2 diabetes mellitus with hyperglycemia: Secondary | ICD-10-CM | POA: Diagnosis not present

## 2021-02-14 DIAGNOSIS — D72829 Elevated white blood cell count, unspecified: Secondary | ICD-10-CM | POA: Diagnosis not present

## 2021-03-20 ENCOUNTER — Other Ambulatory Visit: Payer: Self-pay

## 2021-03-20 ENCOUNTER — Ambulatory Visit (INDEPENDENT_AMBULATORY_CARE_PROVIDER_SITE_OTHER): Payer: BC Managed Care – PPO | Admitting: Endocrinology

## 2021-03-20 VITALS — BP 154/100 | HR 69 | Ht 59.0 in | Wt 155.6 lb

## 2021-03-20 DIAGNOSIS — E119 Type 2 diabetes mellitus without complications: Secondary | ICD-10-CM | POA: Diagnosis not present

## 2021-03-20 LAB — POCT GLYCOSYLATED HEMOGLOBIN (HGB A1C): Hemoglobin A1C: 9.3 % — AB (ref 4.0–5.6)

## 2021-03-20 MED ORDER — TRESIBA FLEXTOUCH 200 UNIT/ML ~~LOC~~ SOPN: 40.0000 [IU] | PEN_INJECTOR | Freq: Every day | SUBCUTANEOUS | 3 refills | Status: DC

## 2021-03-20 NOTE — Patient Instructions (Addendum)
good diet and exercise significantly improve the control of your diabetes.  please let me know if you wish to be referred to a dietician.  high blood sugar is very risky to your health.  you should see an eye doctor and dentist every year.  It is very important to get all recommended vaccinations.  Controlling your blood pressure and cholesterol drastically reduces the damage diabetes does to your body.  Those who smoke should quit.  Please discuss these with your doctor.  check your blood sugar twice a day.  vary the time of day when you check, between before the 3 meals, and at bedtime.  also check if you have symptoms of your blood sugar being too high or too low.  please keep a record of the readings and bring it to your next appointment here (or you can bring the meter itself).  You can write it on any piece of paper.  please call us sooner if your blood sugar goes below 70, or if most of your readings are over 200. In view of your medical condition, you should avoid pregnancy until we have decided it is safe I have sent 2 prescriptions to your pharmacy: to increase the Tresiba, and for the continuous glucose monitor sensors.   Please come back for a follow-up appointment in 2 months.

## 2021-03-20 NOTE — Progress Notes (Signed)
Subjective:    Patient ID: Allison Mathews, female    DOB: Nov 21, 1990, 30 y.o.   MRN: [id number]  HPI pt is referred by Dr Lindell Noe, for diabetes.  Pt states DM was dx'ed in 2019; she is unaware of any chronic complications;  she has PN of uncertain etiol; she has been on insulin since late 2021; pt says her diet and exercise are good; she has never had GDM, pancreatitis, pancreatic surgery, severe hypoglycemia or DKA. She takes [place] 30 units qd.  Pt says she is not at risk for pregnancy now.  she seldom checks cbg.  She stopped metformin and Ozempic, due to weight loss, nausea, and hair loss.   Past Medical History:  Diagnosis Date   Active labor 12/29/2011   Anemia    FeSO4 taken   Heart murmur    Hypercholesteremia    Hypertension    NSVD (normal spontaneous vaginal delivery) 12/29/2011   Scoliosis    Sickle cell trait ([facility])    Yeast infection    NOT FREQUENT    Past Surgical History:  Procedure Laterality Date   NO PAST SURGERIES      Social History   Socioeconomic History   Marital status: Single    Spouse name: Not on file   Number of children: 1   Years of education: 79   Highest education level: Not on file  Occupational History   Occupation: [employer]  Tobacco Use   Smoking status: Never   Smokeless tobacco: Never  Substance and Sexual Activity   Alcohol use: No   Drug use: No   Sexual activity: Not Currently    Birth control/protection: None    Comment: currently pregnant  Other Topics Concern   Not on file  Social History Narrative   Not on file   Social Determinants of Health   Financial Resource Strain: Not on file  Food Insecurity: Not on file  Transportation Needs: Not on file  Physical Activity: Not on file  Stress: Not on file  Social Connections: Not on file  Intimate Partner Violence: Not on file    Current Outpatient Medications on File Prior to Visit  Medication Sig Dispense Refill   loratadine (CLARITIN) 10 MG tablet  Take 10 mg by mouth daily.     losartan (COZAAR) 50 MG tablet losartan 50 mg tablet  TAKE 1 TABLET BY MOUTH ONCE DAILY     rosuvastatin (CRESTOR) 5 MG tablet rosuvastatin 5 mg tablet  TAKE 1 TABLET BY MOUTH ONCE DAILY IN THE EVENING     bisoprolol-hydrochlorothiazide (ZIAC) 2.5-6.25 MG tablet Take 1 tablet by mouth daily. (Patient not taking: Reported on 03/20/2021)  0   No current facility-administered medications on file prior to visit.    No Known Allergies  Family History  Problem Relation Age of Onset   Diabetes Father    Diabetes Paternal Grandmother    Hypertension Paternal Aunt    Anesthesia problems Neg Hx    Hypotension Neg Hx    Malignant hyperthermia Neg Hx    Pseudochol deficiency Neg Hx     BP (!) 154/100    Pulse 69    Ht 4\' 11"  (1.499 m)    Wt 155 lb 9.6 oz (70.6 kg)    SpO2 98%    BMI 31.43 kg/m   Review of Systems denies weight loss, hypoglycemia, sob, n/v.      Objective:   Physical Exam Pulses: dorsalis pedis intact bilat.   MSK: no deformity  of the feet CV: no leg edema Skin:  no ulcer on the feet.  normal color and temp on the feet. Neuro: sensation is intact to touch on the feet, but decreased from normal.  HTN was also addressed, and was npt well-controlled   Lab Results  Component Value Date   HGBA1C 9.3 (A) 03/20/2021   outside test results are reviewed: Anti-GAD is neg  I have reviewed outside records, and summarized: Pt was noted to have elevated A1c, and referred here.     Assessment & Plan:  Insulin-requiring type 2 DM: uncontrolled.    Patient Instructions  good diet and exercise significantly improve the control of your diabetes.  please let me know if you wish to be referred to a dietician.  high blood sugar is very risky to your health.  you should see an eye doctor and dentist every year.  It is very important to get all recommended vaccinations.  Controlling your blood pressure and cholesterol drastically reduces the damage  diabetes does to your body.  Those who smoke should quit.  Please discuss these with your doctor.  check your blood sugar twice a day.  vary the time of day when you check, between before the 3 meals, and at bedtime.  also check if you have symptoms of your blood sugar being too high or too low.  please keep a record of the readings and bring it to your next appointment here (or you can bring the meter itself).  You can write it on any piece of paper.  please call us sooner if your blood sugar goes below 70, or if most of your readings are over 200. In view of your medical condition, you should avoid pregnancy until we have decided it is safe I have sent 2 prescriptions to your pharmacy: to increase the Tresiba, and for the continuous glucose monitor sensors.   Please come back for a follow-up appointment in 2 months.

## 2021-03-21 ENCOUNTER — Telehealth: Payer: Self-pay | Admitting: Endocrinology

## 2021-03-21 NOTE — Telephone Encounter (Signed)
Patient called re: Patient states Dr. Loanne Drilling told Patient at visit 03/20/21 that he was sending a New RX for Continuous Glucose Monitor,  Sensors, etc.. however, Patient's Pharmacy did not receive the above RX's. Pharm did receive RX for [place].  Patient requests RX for Continuous Glucose Monitor,  Sensors, etc. Be sent to:   [place], [place] - [address] Phone:  [phone]  Fax:  [phone]

## 2021-03-22 MED ORDER — FREESTYLE LIBRE 2 SENSOR MISC: 1.0000 | 3 refills | Status: DC

## 2021-03-22 MED ORDER — FREESTYLE LIBRE 2 READER DEVI: 1.0000 | Freq: Once | 1 refills | Status: AC

## 2021-03-22 NOTE — Telephone Encounter (Signed)
Patient has now been informed of Freestyle libre sensors sent in.

## 2021-03-26 DIAGNOSIS — R300 Dysuria: Secondary | ICD-10-CM | POA: Diagnosis not present

## 2021-05-20 DIAGNOSIS — R110 Nausea: Secondary | ICD-10-CM | POA: Diagnosis not present

## 2021-05-20 DIAGNOSIS — R42 Dizziness and giddiness: Secondary | ICD-10-CM | POA: Diagnosis not present

## 2021-05-21 ENCOUNTER — Ambulatory Visit: Payer: BC Managed Care – PPO | Admitting: Endocrinology

## 2021-05-23 ENCOUNTER — Encounter: Payer: Self-pay | Admitting: Emergency Medicine

## 2021-05-23 ENCOUNTER — Emergency Department: Payer: BC Managed Care – PPO

## 2021-05-23 ENCOUNTER — Other Ambulatory Visit: Payer: Self-pay

## 2021-05-23 ENCOUNTER — Ambulatory Visit: Payer: BC Managed Care – PPO | Admitting: Endocrinology

## 2021-05-23 ENCOUNTER — Emergency Department
Admission: EM | Admit: 2021-05-23 | Discharge: 2021-05-23 | Disposition: A | Discharge status: Home / Self Care | Payer: BC Managed Care – PPO | Attending: Student in an Organized Health Care Education/Training Program | Admitting: Student in an Organized Health Care Education/Training Program

## 2021-05-23 DIAGNOSIS — Z20822 Contact with and (suspected) exposure to covid-19: Secondary | ICD-10-CM | POA: Insufficient documentation

## 2021-05-23 DIAGNOSIS — E860 Dehydration: Secondary | ICD-10-CM | POA: Insufficient documentation

## 2021-05-23 DIAGNOSIS — R109 Unspecified abdominal pain: Secondary | ICD-10-CM | POA: Insufficient documentation

## 2021-05-23 DIAGNOSIS — R0602 Shortness of breath: Secondary | ICD-10-CM | POA: Diagnosis not present

## 2021-05-23 DIAGNOSIS — R42 Dizziness and giddiness: Secondary | ICD-10-CM | POA: Diagnosis not present

## 2021-05-23 DIAGNOSIS — R110 Nausea: Secondary | ICD-10-CM | POA: Diagnosis not present

## 2021-05-23 DIAGNOSIS — R0902 Hypoxemia: Secondary | ICD-10-CM | POA: Diagnosis not present

## 2021-05-23 DIAGNOSIS — E876 Hypokalemia: Secondary | ICD-10-CM | POA: Diagnosis not present

## 2021-05-23 DIAGNOSIS — R5383 Other fatigue: Secondary | ICD-10-CM | POA: Diagnosis not present

## 2021-05-23 DIAGNOSIS — I10 Essential (primary) hypertension: Secondary | ICD-10-CM | POA: Diagnosis not present

## 2021-05-23 DIAGNOSIS — R9431 Abnormal electrocardiogram [ECG] [EKG]: Secondary | ICD-10-CM | POA: Diagnosis not present

## 2021-05-23 DIAGNOSIS — R5381 Other malaise: Secondary | ICD-10-CM | POA: Diagnosis not present

## 2021-05-23 LAB — CBC WITH DIFFERENTIAL/PLATELET
Abs Immature Granulocytes: 0.04 10*3/uL (ref 0.00–0.07)
Basophils Absolute: 0 10*3/uL (ref 0.0–0.1)
Basophils Relative: 0 %
Eosinophils Absolute: 0.1 10*3/uL (ref 0.0–0.5)
Eosinophils Relative: 1 %
HCT: 40.4 % (ref 36.0–46.0)
Hemoglobin: 14.5 g/dL (ref 12.0–15.0)
Immature Granulocytes: 1 %
Lymphocytes Relative: 20 %
Lymphs Abs: 1.5 10*3/uL (ref 0.7–4.0)
MCH: 29.1 pg (ref 26.0–34.0)
MCHC: 35.9 g/dL (ref 30.0–36.0)
MCV: 81 fL (ref 80.0–100.0)
Monocytes Absolute: 1.1 10*3/uL — ABNORMAL HIGH (ref 0.1–1.0)
Monocytes Relative: 15 %
Neutro Abs: 4.7 10*3/uL (ref 1.7–7.7)
Neutrophils Relative %: 63 %
Platelets: 288 10*3/uL (ref 150–400)
RBC: 4.99 MIL/uL (ref 3.87–5.11)
RDW: 12.4 % (ref 11.5–15.5)
WBC: 7.5 10*3/uL (ref 4.0–10.5)
nRBC: 0 % (ref 0.0–0.2)

## 2021-05-23 LAB — RESP PANEL BY RT-PCR (FLU A&B, COVID) ARPGX2
Influenza A by PCR: NEGATIVE
Influenza B by PCR: NEGATIVE
SARS Coronavirus 2 by RT PCR: NEGATIVE

## 2021-05-23 LAB — LIPASE, BLOOD: Lipase: 34 U/L (ref 11–51)

## 2021-05-23 LAB — URINALYSIS, ROUTINE W REFLEX MICROSCOPIC
Bacteria, UA: NONE SEEN
Bilirubin Urine: NEGATIVE
Glucose, UA: NEGATIVE mg/dL
Ketones, ur: 5 mg/dL — AB
Leukocytes,Ua: NEGATIVE
Nitrite: NEGATIVE
Protein, ur: NEGATIVE mg/dL
Specific Gravity, Urine: 1.01 (ref 1.005–1.030)
pH: 6 (ref 5.0–8.0)

## 2021-05-23 LAB — D-DIMER, QUANTITATIVE: D-Dimer, Quant: <0.27 ug/mL-FEU (ref 0.00–0.50)

## 2021-05-23 LAB — COMPREHENSIVE METABOLIC PANEL
ALT: 12 U/L (ref 0–44)
AST: 14 U/L — ABNORMAL LOW (ref 15–41)
Albumin: 3.8 g/dL (ref 3.5–5.0)
Alkaline Phosphatase: 67 U/L (ref 38–126)
Anion gap: 6 (ref 5–15)
BUN: 10 mg/dL (ref 6–20)
CO2: 26 mmol/L (ref 22–32)
Calcium: 8 mg/dL — ABNORMAL LOW (ref 8.9–10.3)
Chloride: 104 mmol/L (ref 98–111)
Creatinine, Ser: 0.55 mg/dL (ref 0.44–1.00)
GFR, Estimated: >60 mL/min (ref >60)
Glucose, Bld: 155 mg/dL — ABNORMAL HIGH (ref 70–99)
Potassium: 3 mmol/L — ABNORMAL LOW (ref 3.5–5.1)
Sodium: 136 mmol/L (ref 135–145)
Total Bilirubin: 0.4 mg/dL (ref 0.3–1.2)
Total Protein: 7 g/dL (ref 6.5–8.1)

## 2021-05-23 LAB — TROPONIN I (HIGH SENSITIVITY)
Troponin I (High Sensitivity): 2 ng/L (ref <18)
Troponin I (High Sensitivity): 2 ng/L (ref <18)

## 2021-05-23 LAB — POC URINE PREG, ED: Preg Test, Ur: NEGATIVE

## 2021-05-23 LAB — TSH: TSH: 1.368 u[IU]/mL (ref 0.350–4.500)

## 2021-05-23 MED ORDER — POTASSIUM CHLORIDE CRYS ER 10 MEQ PO TBCR: 20.0000 meq | EXTENDED_RELEASE_TABLET | Freq: Every day | ORAL | 0 refills | Status: DC

## 2021-05-23 MED ORDER — POTASSIUM CHLORIDE CRYS ER 20 MEQ PO TBCR
40.0000 meq | EXTENDED_RELEASE_TABLET | Freq: Once | ORAL | Status: AC
  Administered 2021-05-23: 40 meq via ORAL
  Filled 2021-05-23: qty 2

## 2021-05-23 MED ORDER — SODIUM CHLORIDE 0.9 % IV BOLUS
1000.0000 mL | Freq: Once | INTRAVENOUS | Status: AC
  Administered 2021-05-23: 1000 mL via INTRAVENOUS

## 2021-05-23 MED ORDER — ONDANSETRON 4 MG PO TBDP: 4.0000 mg | ORAL_TABLET | Freq: Three times a day (TID) | ORAL | 0 refills | Status: DC | PRN

## 2021-05-23 NOTE — ED Provider Notes (Signed)
[facility] Provider Note    Event Date/Time   First MD Initiated Contact with Patient 05/23/21 0732     (approximate)   History   Dizziness   HPI  Allison Mathews is a 31 y.o. female   with a history of anemia hypertension hypercholesterolemia presents to the ER for generalized malaise and fatigue has had some nausea over the past few days.  States that she woke up this morning feeling lightheaded and passed out.  Did not hit her head.  Denies any numbness or tingling.  Denies any headache.  Is having intermittent abdominal discomfort also complaining of shortness of breath.  No pain with deep inspiration.  Denies any history of asthma or COPD or bronchitis.  No recent sick contacts.  No recent antibiotics.  No dysuria.  States that she is stressed she has been going through a recent move.      Physical Exam   Triage Vital Signs: ED Triage Vitals  Enc Vitals Group     BP 05/23/21 0554 140/89     Pulse Rate 05/23/21 0554 76     Resp 05/23/21 0554 18     Temp 05/23/21 0554 98.4 F (36.9 C)     Temp Source 05/23/21 0554 Oral     SpO2 05/23/21 0538 100 %     Weight 05/23/21 0551 155 lb (70.3 kg)     Height 05/23/21 0551 4\' 11"  (1.499 m)     Head Circumference --      Peak Flow --      Pain Score 05/23/21 0551 0     Pain Loc --      Pain Edu? --      Excl. in [place]? --     Most recent vital signs: Vitals:   05/23/21 0554 05/23/21 0809  BP: 140/89 120/81  Pulse: 76 62  Resp: 18 16  Temp: 98.4 F (36.9 C)   SpO2: 95% 100%     Constitutional: Alert  Eyes: Conjunctivae are normal.  Head: Atraumatic. Nose: No congestion/rhinnorhea. Mouth/Throat: Mucous membranes are moist.   Neck: Painless ROM.  Cardiovascular:   Good peripheral circulation. Respiratory: Normal respiratory effort.  No retractions.  Gastrointestinal: Soft and nontender.  Musculoskeletal:  no deformity Neurologic:  MAE spontaneously. No gross focal neurologic deficits are  appreciated.  Skin:  Skin is warm, dry and intact. No rash noted. Psychiatric: Mood and affect are normal. Speech and behavior are normal.    ED Results / Procedures / Treatments   Labs (all labs ordered are listed, but only abnormal results are displayed) Labs Reviewed  CBC WITH DIFFERENTIAL/PLATELET - Abnormal; Notable for the following components:      Result Value   Monocytes Absolute 1.1 (*)    All other components within normal limits  COMPREHENSIVE METABOLIC PANEL - Abnormal; Notable for the following components:   Potassium 3.0 (*)    Glucose, Bld 155 (*)    Calcium 8.0 (*)    AST 14 (*)    All other components within normal limits  URINALYSIS, ROUTINE W REFLEX MICROSCOPIC - Abnormal; Notable for the following components:   Color, Urine YELLOW (*)    APPearance CLEAR (*)    Hgb urine dipstick MODERATE (*)    Ketones, ur 5 (*)    All other components within normal limits  RESP PANEL BY RT-PCR (FLU A&B, COVID) ARPGX2  TSH  LIPASE, BLOOD  D-DIMER, QUANTITATIVE (NOT AT [facility])  POC URINE PREG, ED  TROPONIN I (HIGH SENSITIVITY)  TROPONIN I (HIGH SENSITIVITY)     EKG  ED ECG REPORT I, Merlyn Lot, the attending physician, personally viewed and interpreted this ECG.   Date: 05/23/2021  EKG Time: 6:05  Rate: 80  Rhythm: sinus  Axis: normal  Intervals:normal intervals  ST&T Change: nonspecific t wave abn, no stemi    RADIOLOGY Please see ED Course for my review and interpretation.  I personally reviewed all radiographic images ordered to evaluate for the above acute complaints and reviewed radiology reports and findings.  These findings were personally discussed with the patient.  Please see medical record for radiology report.    PROCEDURES:  Critical Care performed: No  Procedures   MEDICATIONS ORDERED IN ED: Medications  sodium chloride 0.9 % bolus 1,000 mL (1,000 mLs Intravenous New Bag/Given 05/23/21 0758)  potassium chloride SA (KLOR-CON M)  CR tablet 40 mEq (40 mEq Oral Given 05/23/21 0759)     IMPRESSION / MDM / ASSESSMENT AND PLAN / ED COURSE  I reviewed the triage vital signs and the nursing notes.                              Differential diagnosis includes, but is not limited to, dehydration, orthostasis, electrolyte abnormality, anemia, pneumonia, ACS, PE, hypothyroid, deconditioning  Patient presenting with symptoms as described above.  Her exam is reassuring no objective findings.  She is reporting poor p.o. intake may have a component of some dehydration does have some mild hypokalemia therefore will replete.  She is denying any nausea or pain right now.  We will give IV fluids check additional labs as well is x-ray.  Have a low suspicion for ACS given age and risk factors.  Does not seem consistent with PE she is low risk by Wells criteria.   Clinical Course as of 05/23/21 0954  Thu May 23, 2021  0752 Chest x-ray on my review does not show any evidence of infiltrate or cardiomegaly. [PR]  [phone] Patient feels improved she is tolerating p.o.  Suspect mild dehydration.  Given her low potassium I am getting give her potassium supplement to go home as well as some nausea medication.  She has follow-up appointment with PCP.  I do not believe that further diagnostic imaging clinically indicated with this presentation.  We discussed return precautions.  Patient agreeable to plan. [PR]    Clinical Course User Index [PR] Merlyn Lot, MD     FINAL CLINICAL IMPRESSION(S) / ED DIAGNOSES   Final diagnoses:  Dehydration  Hypokalemia     Rx / DC Orders   ED Discharge Orders          Ordered    ondansetron (ZOFRAN-ODT) 4 MG disintegrating tablet  Every 8 hours PRN        05/23/21 0952    potassium chloride (KLOR-CON M) 10 MEQ tablet  Daily        05/23/21 2585             Note:  This document was prepared using Dragon voice recognition software and may include unintentional dictation errors.    Merlyn Lot, MD 05/23/21 [phone]

## 2021-05-23 NOTE — ED Triage Notes (Addendum)
EMS brings pt in from home; to triage via w/c with no distress noted; st "feeling bad since Monday, fatigued, dizziness, nausea and SHOB, syncopal episode at home; denies any recent cough/congestion or fever; denies pain

## 2021-05-23 NOTE — ED Notes (Signed)
See triage note, pt reports syncopal episode this am and dizziness. NAD noted. Alert and oriented. Denies pain

## 2021-05-23 NOTE — ED Notes (Signed)
Ambulatory to restroom

## 2021-05-30 DIAGNOSIS — E876 Hypokalemia: Secondary | ICD-10-CM | POA: Diagnosis not present

## 2021-08-09 ENCOUNTER — Encounter: Payer: Self-pay | Admitting: Endocrinology

## 2021-08-09 ENCOUNTER — Ambulatory Visit: Payer: BC Managed Care – PPO | Admitting: Endocrinology

## 2021-08-09 VITALS — BP 128/84 | Ht 59.0 in | Wt 159.0 lb

## 2021-08-09 DIAGNOSIS — E876 Hypokalemia: Secondary | ICD-10-CM

## 2021-08-09 DIAGNOSIS — E119 Type 2 diabetes mellitus without complications: Secondary | ICD-10-CM | POA: Diagnosis not present

## 2021-08-09 LAB — POCT GLYCOSYLATED HEMOGLOBIN (HGB A1C): Hemoglobin A1C: 7.7 % — AB (ref 4.0–5.6)

## 2021-08-09 LAB — BASIC METABOLIC PANEL
BUN: 9 mg/dL (ref 6–23)
CO2: 26 mEq/L (ref 19–32)
Calcium: 8.6 mg/dL (ref 8.4–10.5)
Chloride: 104 mEq/L (ref 96–112)
Creatinine, Ser: 0.61 mg/dL (ref 0.40–1.20)
GFR: 117.93 mL/min (ref >60.00)
Glucose, Bld: 114 mg/dL — ABNORMAL HIGH (ref 70–99)
Potassium: 3.6 mEq/L (ref 3.5–5.1)
Sodium: 139 mEq/L (ref 135–145)

## 2021-08-09 LAB — MAGNESIUM: Magnesium: 2 mg/dL (ref 1.5–2.5)

## 2021-08-09 MED ORDER — TRESIBA FLEXTOUCH 200 UNIT/ML ~~LOC~~ SOPN: 42.0000 [IU] | PEN_INJECTOR | Freq: Every day | SUBCUTANEOUS | 1 refills | Status: DC

## 2021-08-09 NOTE — Progress Notes (Signed)
? ?Subjective:  ? ? Patient ID: Allison Mathews, female    DOB: Jan 26, 1991, 31 y.o.   MRN: [id number] ? ?HPI ?Pt returns for f/u of diabetes mellitus: ?DM type: Insulin-requiring type 2 ?Dx'ed: 2019 ?Complications: PN of uncertain etiol ?Therapy: insulin since 2021 ?GDM: never ?DKA: never ?Severe hypoglycemia: never ?Pancreatitis: never ?Pancreatic imaging: normal on 2016 CT ?SDOH: She could not afford continuous glucose monitoring. ?Other: She stopped metformin and Ozempic, due to weight loss, nausea, and hair loss; she is not at risk for pregnancy now ?Interval history: Pt says she never misses the insulin, but she says diet is poor.  no cbg record, but states cbg's vary from 95-230.   ?Past Medical History:  ?Diagnosis Date  ? Active labor 12/29/2011  ? Anemia   ? FeSO4 taken  ? Heart murmur   ? Hypercholesteremia   ? Hypertension   ? NSVD (normal spontaneous vaginal delivery) 12/29/2011  ? Scoliosis   ? Sickle cell trait ([place])   ? Yeast infection   ? NOT FREQUENT  ? ? ?Past Surgical History:  ?Procedure Laterality Date  ? NO PAST SURGERIES    ? ? ?Social History  ? ?Socioeconomic History  ? Marital status: Single  ?  Spouse name: Not on file  ? Number of children: 1  ? Years of education: 71  ? Highest education level: Not on file  ?Occupational History  ? Occupation: [employer]  ?Tobacco Use  ? Smoking status: Never  ? Smokeless tobacco: Never  ?Vaping Use  ? Vaping Use: Never used  ?Substance and Sexual Activity  ? Alcohol use: No  ? Drug use: No  ? Sexual activity: Not Currently  ?  Birth control/protection: None  ?  Comment: currently pregnant  ?Other Topics Concern  ? Not on file  ?Social History Narrative  ? Not on file  ? ?Social Determinants of Health  ? ?Financial Resource Strain: Not on file  ?Food Insecurity: Not on file  ?Transportation Needs: Not on file  ?Physical Activity: Not on file  ?Stress: Not on file  ?Social Connections: Not on file  ?Intimate Partner Violence: Not on file  ? ? ?Current  Outpatient Medications on File Prior to Visit  ?Medication Sig Dispense Refill  ? Continuous Blood Gluc Sensor (FREESTYLE LIBRE 2 SENSOR) MISC 1 Device by Does not apply route every 14 (fourteen) days. 6 each 3  ? loratadine (CLARITIN) 10 MG tablet Take 10 mg by mouth daily.    ? losartan (COZAAR) 50 MG tablet losartan 50 mg tablet ? TAKE 1 TABLET BY MOUTH ONCE DAILY    ? ondansetron (ZOFRAN-ODT) 4 MG disintegrating tablet Take 1 tablet (4 mg total) by mouth every 8 (eight) hours as needed for nausea or vomiting. 20 tablet 0  ? rosuvastatin (CRESTOR) 5 MG tablet rosuvastatin 5 mg tablet ? TAKE 1 TABLET BY MOUTH ONCE DAILY IN THE EVENING    ? ?No current facility-administered medications on file prior to visit.  ? ? ?No Known Allergies ? ?Family History  ?Problem Relation Age of Onset  ? Diabetes Father   ? Diabetes Paternal Grandmother   ? Hypertension Paternal Aunt   ? Anesthesia problems Neg Hx   ? Hypotension Neg Hx   ? Malignant hyperthermia Neg Hx   ? Pseudochol deficiency Neg Hx   ? ? ?BP 128/84   Ht '4\' 11"'$  (1.499 m)   Wt 159 lb (72.1 kg)   BMI 32.11 kg/m?  ? ? ?Review of Systems ?  She denies hypoglycemia sxs.   ?   ?Objective:  ? Physical Exam ?VITAL SIGNS:  See vs page ?GENERAL: no distress ? ? ? ?A1c=7.7% ?   ?Assessment & Plan:  ?Insulin-requiring type 2 DM: uncontrolled ? ? ?Patient Instructions  ?check your blood sugar twice a day.  vary the time of day when you check, between before the 3 meals, and at bedtime.  also check if you have symptoms of your blood sugar being too high or too low.  please keep a record of the readings and bring it to your next appointment here (or you can bring the meter itself).  You can write it on any piece of paper.  please call us sooner if your blood sugar goes below 70, or if most of your readings are over 200.   ?I have sent a prescription to your pharmacy, to increase the Tresiba to 42 units each morning.   ?You should have an endocrinology follow-up appointment in 3  months.   ? ? ?

## 2021-08-09 NOTE — Patient Instructions (Addendum)
check your blood sugar twice a day.  vary the time of day when you check, between before the 3 meals, and at bedtime.  also check if you have symptoms of your blood sugar being too high or too low.  please keep a record of the readings and bring it to your next appointment here (or you can bring the meter itself).  You can write it on any piece of paper.  please call us sooner if your blood sugar goes below 70, or if most of your readings are over 200.   ?I have sent a prescription to your pharmacy, to increase the Tresiba to 42 units each morning.   ?You should have an endocrinology follow-up appointment in 3 months.   ?

## 2021-08-15 DIAGNOSIS — I10 Essential (primary) hypertension: Secondary | ICD-10-CM | POA: Diagnosis not present

## 2021-08-15 DIAGNOSIS — E7800 Pure hypercholesterolemia, unspecified: Secondary | ICD-10-CM | POA: Diagnosis not present

## 2021-08-15 DIAGNOSIS — R350 Frequency of micturition: Secondary | ICD-10-CM | POA: Diagnosis not present

## 2021-08-29 DIAGNOSIS — E7800 Pure hypercholesterolemia, unspecified: Secondary | ICD-10-CM | POA: Diagnosis not present

## 2021-08-29 DIAGNOSIS — I10 Essential (primary) hypertension: Secondary | ICD-10-CM | POA: Diagnosis not present

## 2021-10-21 DIAGNOSIS — Z114 Encounter for screening for human immunodeficiency virus [HIV]: Secondary | ICD-10-CM | POA: Diagnosis not present

## 2021-10-21 DIAGNOSIS — R350 Frequency of micturition: Secondary | ICD-10-CM | POA: Diagnosis not present

## 2021-10-21 DIAGNOSIS — Z1159 Encounter for screening for other viral diseases: Secondary | ICD-10-CM | POA: Diagnosis not present

## 2021-10-21 DIAGNOSIS — Z113 Encounter for screening for infections with a predominantly sexual mode of transmission: Secondary | ICD-10-CM | POA: Diagnosis not present

## 2021-10-21 DIAGNOSIS — Z7251 High risk heterosexual behavior: Secondary | ICD-10-CM | POA: Diagnosis not present

## 2021-10-21 DIAGNOSIS — Z118 Encounter for screening for other infectious and parasitic diseases: Secondary | ICD-10-CM | POA: Diagnosis not present

## 2021-10-21 DIAGNOSIS — N898 Other specified noninflammatory disorders of vagina: Secondary | ICD-10-CM | POA: Diagnosis not present

## 2021-11-25 ENCOUNTER — Encounter: Payer: Self-pay | Admitting: Internal Medicine

## 2021-11-25 ENCOUNTER — Ambulatory Visit: Payer: BC Managed Care – PPO | Admitting: Internal Medicine

## 2021-11-25 VITALS — BP 132/82 | HR 70 | Ht 59.0 in | Wt 155.8 lb

## 2021-11-25 DIAGNOSIS — Z794 Long term (current) use of insulin: Secondary | ICD-10-CM | POA: Diagnosis not present

## 2021-11-25 DIAGNOSIS — E1165 Type 2 diabetes mellitus with hyperglycemia: Secondary | ICD-10-CM | POA: Diagnosis not present

## 2021-11-25 DIAGNOSIS — E785 Hyperlipidemia, unspecified: Secondary | ICD-10-CM | POA: Diagnosis not present

## 2021-11-25 DIAGNOSIS — R350 Frequency of micturition: Secondary | ICD-10-CM

## 2021-11-25 LAB — POCT GLYCOSYLATED HEMOGLOBIN (HGB A1C): Hemoglobin A1C: 7 % — AB (ref 4.0–5.6)

## 2021-11-25 NOTE — Progress Notes (Unsigned)
Patient ID: Allison Mathews, female   DOB: 1991/04/06, 31 y.o.   MRN: [id number]  HPI: Allison Mathews is a 31 y.o.-year-old female, returning for follow-up for DM2, dx in 2019, insulin-dependent since 2021, uncontrolled, without long-term complications. Pt. previously saw Dr. Loanne Mathews, last visit 3.5 months ago..  Reviewed HbA1c: Lab Results  Component Value Date   HGBA1C 7.7 (A) 08/09/2021   HGBA1C 9.3 (A) 03/20/2021  02/14/2021: HbA1c 10.7%  Pt is on a regimen of: - Tresiba 42 units daily She stopped metformin, Januvia, and Ozempic due to weight loss, nausea, hair loss.  Pt checks her sugars 0-1x a day and they are: - am: n/c - 2h after b'fast: n/c - before lunch: n/c - 2h after lunch: n/c - before dinner: n/c - 2h after dinner: n/c - bedtime: n/c - nighttime: n/c Lowest sugar was 80; she has hypoglycemia awareness at 90.  Highest sugar was 200 postprandially  Glucometer:One Touch A CGM was not affordable.  - no CKD, last BUN/creatinine:   2021-08-29     Albumin 4.3   3.4-4.8  ALP 81   38-126  ALT 13   0-52  Anion Gap 9.8   6.0-20.0  AST 11   0-39  BUN 11   6-26  CO2 27   22-32  CA-corrected 8.45   8.60-10.30  Calcium 8.8   8.6-10.3  Chloride 105   98-107  Creatinine 0.64   0.60-1.30  eGFR2021 120   >60  Glucose 192   70-99  Potassium 4.2   3.5-5.5  Sodium 138   136-145  Protein, Total 6.6   6.0-8.3  TBIL 0.2   0.3-1.0   Lab Results  Component Value Date   BUN 9 08/09/2021   BUN 10 05/23/2021   CREATININE 0.61 08/09/2021   CREATININE 0.55 05/23/2021  On Cozaar 50 mg daily.  -+ HL; last set of lipids:  2021-08-29     LDL Chol Calc (NIH) 69   0-99  CHOL/HDL 2.9   2.0-4.0  Cholesterol 130   <200  HDLD 45   30-85  LDL Chol Calc (NIH) 69   0-99  NHDL 85   0-129  Triglyceride 78   0-199   No results found for: "CHOL", "HDL", "LDLCALC", "LDLDIRECT", "TRIG", "CHOLHDL" She is on Crestor 5 mg daily.  - no numbness and tingling in her  feet.  ROS: No blurry vision, nausea, chest pain.  She does mention urinary frequency.  Past Medical History:  Diagnosis Date   Active labor 12/29/2011   Anemia    FeSO4 taken   Heart murmur    Hypercholesteremia    Hypertension    NSVD (normal spontaneous vaginal delivery) 12/29/2011   Scoliosis    Sickle cell trait (HCC)    Yeast infection    NOT FREQUENT   Past Surgical History:  Procedure Laterality Date   NO PAST SURGERIES     Social History   Socioeconomic History   Marital status: Single    Spouse name: Not on file   Number of children: 1   Years of education: 20   Highest education level: Not on file  Occupational History   Occupation: [employer]  Tobacco Use   Smoking status: Never   Smokeless tobacco: Never  Vaping Use   Vaping Use: Never used  Substance and Sexual Activity   Alcohol use: No   Drug use: No   Sexual activity: Not Currently    Birth control/protection: None    Comment:  currently pregnant  Other Topics Concern   Not on file  Social History Narrative   Not on file   Social Determinants of Health   Financial Resource Strain: Not on file  Food Insecurity: Not on file  Transportation Needs: Not on file  Physical Activity: Not on file  Stress: Not on file  Social Connections: Not on file  Intimate Partner Violence: Not on file   Current Outpatient Medications on File Prior to Visit  Medication Sig Dispense Refill   Continuous Blood Gluc Sensor (FREESTYLE LIBRE 2 SENSOR) MISC 1 Device by Does not apply route every 14 (fourteen) days. 6 each 3   insulin degludec (TRESIBA FLEXTOUCH) 200 UNIT/ML FlexTouch Pen Inject 42 Units into the skin daily. And pen needles 1/day 45 mL 1   loratadine (CLARITIN) 10 MG tablet Take 10 mg by mouth daily.     losartan (COZAAR) 50 MG tablet losartan 50 mg tablet  TAKE 1 TABLET BY MOUTH ONCE DAILY     ondansetron (ZOFRAN-ODT) 4 MG disintegrating tablet Take 1 tablet (4 mg total) by mouth every 8 (eight)  hours as needed for nausea or vomiting. 20 tablet 0   rosuvastatin (CRESTOR) 5 MG tablet rosuvastatin 5 mg tablet  TAKE 1 TABLET BY MOUTH ONCE DAILY IN THE EVENING     No current facility-administered medications on file prior to visit.   No Known Allergies Family History  Problem Relation Age of Onset   Diabetes Father    Diabetes Paternal Grandmother    Hypertension Paternal Aunt    Anesthesia problems Neg Hx    Hypotension Neg Hx    Malignant hyperthermia Neg Hx    Pseudochol deficiency Neg Hx     ROS: + see HPI No increased urination, blurry vision, nausea, chest pain.  Past Medical History:  Diagnosis Date   Active labor 12/29/2011   Anemia    FeSO4 taken   Heart murmur    Hypercholesteremia    Hypertension    NSVD (normal spontaneous vaginal delivery) 12/29/2011   Scoliosis    Sickle cell trait (HCC)    Yeast infection    NOT FREQUENT   Past Surgical History:  Procedure Laterality Date   NO PAST SURGERIES     Social History   Socioeconomic History   Marital status: Single    Spouse name: Not on file   Number of children: 1   Years of education: 63   Highest education level: Not on file  Occupational History   Occupation: [employer]  Tobacco Use   Smoking status: Never   Smokeless tobacco: Never  Vaping Use   Vaping Use: Never used  Substance and Sexual Activity   Alcohol use: No   Drug use: No   Sexual activity: Not Currently    Birth control/protection: None    Comment: currently pregnant  Other Topics Concern   Not on file  Social History Narrative   Not on file   Social Determinants of Health   Financial Resource Strain: Not on file  Food Insecurity: Not on file  Transportation Needs: Not on file  Physical Activity: Not on file  Stress: Not on file  Social Connections: Not on file  Intimate Partner Violence: Not on file   Current Outpatient Medications on File Prior to Visit  Medication Sig Dispense Refill   insulin degludec  (TRESIBA FLEXTOUCH) 200 UNIT/ML FlexTouch Pen Inject 42 Units into the skin daily. And pen needles 1/day 45 mL 1   loratadine (CLARITIN)  10 MG tablet Take 10 mg by mouth daily.     losartan (COZAAR) 50 MG tablet losartan 50 mg tablet  TAKE 1 TABLET BY MOUTH ONCE DAILY     ondansetron (ZOFRAN-ODT) 4 MG disintegrating tablet Take 1 tablet (4 mg total) by mouth every 8 (eight) hours as needed for nausea or vomiting. 20 tablet 0   rosuvastatin (CRESTOR) 5 MG tablet rosuvastatin 5 mg tablet  TAKE 1 TABLET BY MOUTH ONCE DAILY IN THE EVENING     No current facility-administered medications on file prior to visit.   No Known Allergies Family History  Problem Relation Age of Onset   Diabetes Father    Diabetes Paternal Grandmother    Hypertension Paternal Aunt    Anesthesia problems Neg Hx    Hypotension Neg Hx    Malignant hyperthermia Neg Hx    Pseudochol deficiency Neg Hx     PE: BP 132/82 (BP Location: Right Arm, Patient Position: Sitting, Cuff Size: Normal)   Pulse 70   Ht '4\' 11"'$  (1.499 m)   Wt 155 lb 12.8 oz (70.7 kg)   SpO2 99%   BMI 31.47 kg/m  Wt Readings from Last 3 Encounters:  08/09/21 159 lb (72.1 kg)  05/23/21 155 lb (70.3 kg)  03/20/21 155 lb 9.6 oz (70.6 kg)   Constitutional: overweight, in NAD Eyes: no exophthalmos ENT: moist mucous membranes, no thyromegaly, no cervical lymphadenopathy Cardiovascular: RRR, No MRG Respiratory: CTA B Musculoskeletal: no deformities Skin: moist, warm, no rashes Neurological: no tremor with outstretched hands  ASSESSMENT: 1. DM2, insulin-dependent, uncontrolled, without long-term complications, but with hyperglycemia  2. HL  3.  Urinary frequency  PLAN:  1. Patient with long-standing, uncontrolled diabetes, on long-acting insulin only, with suboptimal control.  At last visit with Dr. Loanne Mathews, her HbA1c was better, but still above target, at 7.7%.  At today's visit, HbA1c is 7.0% (improved). -At this visit, patient tells me  that she is not checking sugars consistently.  She only checks when she feels poorly.  The sugars are usually normal or high, up to 200, but she did not have any low blood sugars, under 80s.  She did see a trend of lower blood sugars in the morning and higher blood sugars postprandially. We discussed about the need to check blood sugars more consistently, but at this visit we also discussed about her previous intolerances and I wanted to suggest an SGLT2 inhibitor, however, at this visit, patient tells me that she may be pregnant.  I advised her that in this case, cannot use an SGLT2 inhibitor. We discussed about the target for HbA1c before pregnancy (ideally less than 6%), and also the need to check blood sugars 3 times a day at least, before meals, to understand more about her blood sugars and to see whether she needs mealtime insulin to prepare for the pregnancy and also to use during the pregnancy.  She agrees to do so. - I suggested to:  Patient Instructions  Please continue: - Tresiba 42 units in a.m  Stop Crestor.  Start checking blood sugars ideally 3x a day before meals and occasionally at bedtime.  Please return in 3-4 months with your sugar log.   - check sugars at different times of the day - check 3x a day, rotating checks - discussed about CBG targets for treatment: 80-130 mg/dL before meals and <180 mg/dL after meals; target HbA1c <7%. - given foot care handout  - given instructions for hypoglycemia management "15-15 rule"  -  Return to clinic in 3-4 mo with sugar log   2.  Hyperlipidemia - Reviewed latest lipid panel from 08/2021: All fractions at goal - she is Crestor 5 mg daily without side effects.  At this visit, she tells me that she is interested in stopping Crestor, if possible.  I asked the patient whether she is contemplating a pregnancy and she mentions that this is, indeed, the case, and she may even be pregnant right now.  In that case, I strongly advised her to stop  Crestor right away.  I offered to check a pregnancy test at today's visit but she declines, but agrees to check on at home.  3.  Urinary frequency -With mild burning -We will check a urinalysis  Philemon Kingdom, MD PhD [facility]

## 2021-11-25 NOTE — Patient Instructions (Addendum)
Please continue: - Tresiba 42 units in a.m  Stop Crestor.  Start checking blood sugars ideally 3x a day before meals and occasionally at bedtime.  Please return in 3-4 months with your sugar log.   PATIENT INSTRUCTIONS FOR TYPE 2 DIABETES:  DIET AND EXERCISE Diet and exercise is an important part of diabetic treatment.  We recommended aerobic exercise in the form of brisk walking (working between 40-60% of maximal aerobic capacity, similar to brisk walking) for 150 minutes per week (such as 30 minutes five days per week) along with 3 times per week performing 'resistance' training (using various gauge rubber tubes with handles) 5-10 exercises involving the major muscle groups (upper body, lower body and core) performing 10-15 repetitions (or near fatigue) each exercise. Start at half the above goal but build slowly to reach the above goals. If limited by weight, joint pain, or disability, we recommend daily walking in a swimming pool with water up to waist to reduce pressure from joints while allow for adequate exercise.    BLOOD GLUCOSES Monitoring your blood glucoses is important for continued management of your diabetes. Please check your blood glucoses 2-4 times a day: fasting, before meals and at bedtime (you can rotate these measurements - e.g. one day check before the 3 meals, the next day check before 2 of the meals and before bedtime, etc.).   HYPOGLYCEMIA (low blood sugar) Hypoglycemia is usually a reaction to not eating, exercising, or taking too much insulin/ other diabetes drugs.  Symptoms include tremors, sweating, hunger, confusion, headache, etc. Treat IMMEDIATELY with 15 grams of Carbs: 4 glucose tablets  cup regular juice/soda 2 tablespoons raisins 4 teaspoons sugar 1 tablespoon honey Recheck blood glucose in 15 mins and repeat above if still symptomatic/blood glucose <100.  RECOMMENDATIONS TO REDUCE YOUR RISK OF DIABETIC COMPLICATIONS: * Take your prescribed  MEDICATION(S) * Follow a DIABETIC diet: Complex carbs, fiber rich foods, (monounsaturated and polyunsaturated) fats * AVOID saturated/trans fats, high fat foods, >2,300 mg salt per day. * EXERCISE at least 5 times a week for 30 minutes or preferably daily.  * DO NOT SMOKE OR DRINK more than 1 drink a day. * Check your FEET every day. Do not wear tightfitting shoes. Contact us if you develop an ulcer * See your EYE doctor once a year or more if needed * Get a FLU shot once a year * Get a PNEUMONIA vaccine once before and once after age 17 years  GOALS:  * Your Hemoglobin A1c of <7%  * fasting sugars need to be <130 * after meals sugars need to be <180 (2h after you start eating) * Your Systolic BP should be 858 or lower  * Your Diastolic BP should be 80 or lower  * Your HDL (Good Cholesterol) should be 40 or higher  * Your LDL (Bad Cholesterol) should be 100 or lower. * Your Triglycerides should be 150 or lower  * Your Urine microalbumin (kidney function) should be <30 * Your Body Mass Index should be 25 or lower    Please consider the following ways to cut down carbs and fat and increase fiber and micronutrients in your diet: - substitute whole grain for white bread or pasta - substitute brown rice for white rice - substitute 90-calorie flat bread pieces for slices of bread when possible - substitute sweet potatoes or yams for white potatoes - substitute humus for margarine - substitute tofu for cheese when possible - substitute almond or rice milk for regular  milk (would not drink soy milk daily due to concern for soy estrogen influence on breast cancer risk) - substitute dark chocolate for other sweets when possible - substitute water - can add lemon or orange slices for taste - for diet sodas (artificial sweeteners will trick your body that you can eat sweets without getting calories and will lead you to overeating and weight gain in the long run) - do not skip breakfast or other  meals (this will slow down the metabolism and will result in more weight gain over time)  - can try smoothies made from fruit and almond/rice milk in am instead of regular breakfast - can also try old-fashioned (not instant) oatmeal made with almond/rice milk in am - order the dressing on the side when eating salad at a restaurant (pour less than half of the dressing on the salad) - eat as little meat as possible - can try juicing, but should not forget that juicing will get rid of the fiber, so would alternate with eating raw veg./fruits or drinking smoothies - use as little oil as possible, even when using olive oil - can dress a salad with a mix of balsamic vinegar and lemon juice, for e.g. - use agave nectar, stevia sugar, or regular sugar rather than artificial sweateners - steam or broil/roast veggies  - snack on veggies/fruit/nuts (unsalted, preferably) when possible, rather than processed foods - reduce or eliminate aspartame in diet (it is in diet sodas, chewing gum, etc) Read the labels!  Try to read Dr. Janene Harvey book: "Program for Reversing Diabetes" for other ideas for healthy eating.

## 2021-11-26 DIAGNOSIS — R350 Frequency of micturition: Secondary | ICD-10-CM | POA: Diagnosis not present

## 2021-11-26 LAB — URINALYSIS, DIPSTICK ONLY
Bilirubin, UA: NEGATIVE
Glucose, UA: NEGATIVE
Leukocytes,UA: NEGATIVE
Nitrite, UA: NEGATIVE
RBC, UA: NEGATIVE
Specific Gravity, UA: 1.024 (ref 1.005–1.030)
Urobilinogen, Ur: 0.2 mg/dL (ref 0.2–1.0)
pH, UA: 6.5 (ref 5.0–7.5)

## 2021-11-26 LAB — POCT URINALYSIS DIPSTICK
Bilirubin, UA: NEGATIVE
Blood, UA: NEGATIVE
Glucose, UA: NEGATIVE
Leukocytes, UA: NEGATIVE
Nitrite, UA: NEGATIVE
Protein, UA: NEGATIVE
Spec Grav, UA: 1.025 (ref 1.010–1.025)
Urobilinogen, UA: 0.2 E.U./dL
pH, UA: 6.5 (ref 5.0–8.0)

## 2022-02-24 DIAGNOSIS — I10 Essential (primary) hypertension: Secondary | ICD-10-CM | POA: Diagnosis not present

## 2022-02-24 DIAGNOSIS — E1165 Type 2 diabetes mellitus with hyperglycemia: Secondary | ICD-10-CM | POA: Diagnosis not present

## 2022-02-24 DIAGNOSIS — Z20822 Contact with and (suspected) exposure to covid-19: Secondary | ICD-10-CM | POA: Diagnosis not present

## 2022-02-24 DIAGNOSIS — E7800 Pure hypercholesterolemia, unspecified: Secondary | ICD-10-CM | POA: Diagnosis not present

## 2022-02-24 DIAGNOSIS — J029 Acute pharyngitis, unspecified: Secondary | ICD-10-CM | POA: Diagnosis not present

## 2022-03-01 DIAGNOSIS — E119 Type 2 diabetes mellitus without complications: Secondary | ICD-10-CM | POA: Diagnosis not present

## 2022-03-01 DIAGNOSIS — H40033 Anatomical narrow angle, bilateral: Secondary | ICD-10-CM | POA: Diagnosis not present

## 2022-03-27 ENCOUNTER — Ambulatory Visit (INDEPENDENT_AMBULATORY_CARE_PROVIDER_SITE_OTHER): Payer: BC Managed Care – PPO | Admitting: Internal Medicine

## 2022-03-27 ENCOUNTER — Encounter: Payer: Self-pay | Admitting: Internal Medicine

## 2022-03-27 VITALS — BP 128/84 | HR 82 | Ht 59.0 in | Wt 160.8 lb

## 2022-03-27 DIAGNOSIS — E1165 Type 2 diabetes mellitus with hyperglycemia: Secondary | ICD-10-CM | POA: Diagnosis not present

## 2022-03-27 DIAGNOSIS — Z794 Long term (current) use of insulin: Secondary | ICD-10-CM | POA: Diagnosis not present

## 2022-03-27 DIAGNOSIS — E785 Hyperlipidemia, unspecified: Secondary | ICD-10-CM | POA: Diagnosis not present

## 2022-03-27 LAB — POCT GLYCOSYLATED HEMOGLOBIN (HGB A1C): Hemoglobin A1C: 6.3 % — AB (ref 4.0–5.6)

## 2022-03-27 MED ORDER — ONETOUCH DELICA LANCETS 33G MISC: 3 refills | Status: DC

## 2022-03-27 MED ORDER — GLUCOSE BLOOD VI STRP: ORAL_STRIP | 3 refills | Status: DC

## 2022-03-27 NOTE — Progress Notes (Signed)
Patient ID: [id number] ONEIL, female   DOB: 18-Oct-1990, 31 y.o.   MRN: [id number]  HPI: Allison Mathews is a 31 y.o.-year-old female, returning for follow-up for DM2, dx in 2019, insulin-dependent since 2021, uncontrolled, without long-term complications. Pt. previously saw Dr. Loanne Drilling, but last visit with me 4 months ago. She is late 20 min today.  Interim history: No increased urination, blurry vision, nausea, chest pain. At last visit, she mentions that there was a possibility that she could be pregnant.  At this visit, she tells me that her pregnancy test was negative.  Reviewed HbA1c: Lab Results  Component Value Date   HGBA1C 7.0 (A) 11/25/2021   HGBA1C 7.7 (A) 08/09/2021   HGBA1C 9.3 (A) 03/20/2021  02/14/2021: HbA1c 10.7%  Pt is on a regimen of: - Tresiba 42 units daily She stopped metformin, Januvia, and Ozempic due to weight loss, nausea, hair loss.  Pt checks her sugars 0-1x a day and they are: - am: 93-100 - 2h after b'fast: n/c - before lunch: 110 - 2h after lunch: up to 170 - before dinner: 83 - 2h after dinner: occas. 220 - bedtime: n/c - nighttime: n/c Lowest sugar was 80 >> 83; she has hypoglycemia awareness at 90.  Highest sugar was 200 >> 220 postprandially.  Glucometer:One Touch A CGM was not affordable.  - no CKD, last BUN/creatinine:   2021-08-29     Albumin 4.3   3.4-4.8  ALP 81   38-126  ALT 13   0-52  Anion Gap 9.8   6.0-20.0  AST 11   0-39  BUN 11   6-26  CO2 27   22-32  CA-corrected 8.45   8.60-10.30  Calcium 8.8   8.6-10.3  Chloride 105   98-107  Creatinine 0.64   0.60-1.30  eGFR2021 120   >60  Glucose 192   70-99  Potassium 4.2   3.5-5.5  Sodium 138   136-145  Protein, Total 6.6   6.0-8.3  TBIL 0.2   0.3-1.0   Lab Results  Component Value Date   BUN 9 08/09/2021   BUN 10 05/23/2021   CREATININE 0.61 08/09/2021   CREATININE 0.55 05/23/2021  Prev. On Cozaar 50 mg daily - off since 11/2021.  -+ HL; last set of lipids:   2021-08-29     LDL Chol Calc (NIH) 69   0-99  CHOL/HDL 2.9   2.0-4.0  Cholesterol 130   <200  HDLD 45   30-85  LDL Chol Calc (NIH) 69   0-99  NHDL 85   0-129  Triglyceride 78   0-199   No results found for: "CHOL", "HDL", "LDLCALC", "LDLDIRECT", "TRIG", "CHOLHDL" We stopped Crestor 5 mg daily at last visit, due to the possibility of pregnancy. Now off since 11/2021.  - + numbness and tingling in her feet.  - Last eye exam 2 weeks ago: No DR reportedly.  ROS: + see HPI  Past Medical History:  Diagnosis Date   Active labor 12/29/2011   Anemia    FeSO4 taken   Heart murmur    Hypercholesteremia    Hypertension    NSVD (normal spontaneous vaginal delivery) 12/29/2011   Scoliosis    Sickle cell trait ([place])    Yeast infection    NOT FREQUENT   Past Surgical History:  Procedure Laterality Date   NO PAST SURGERIES     Social History   Socioeconomic History   Marital status: Single    Spouse name: Not on file  Number of children: 1   Years of education: 13   Highest education level: Not on file  Occupational History   Occupation: [employer]  Tobacco Use   Smoking status: Never   Smokeless tobacco: Never  Vaping Use   Vaping Use: Never used  Substance and Sexual Activity   Alcohol use: No   Drug use: No   Sexual activity: Not Currently    Birth control/protection: None    Comment: currently pregnant  Other Topics Concern   Not on file  Social History Narrative   Not on file   Social Determinants of Health   Financial Resource Strain: Not on file  Food Insecurity: Not on file  Transportation Needs: Not on file  Physical Activity: Not on file  Stress: Not on file  Social Connections: Not on file  Intimate Partner Violence: Not on file   Current Outpatient Medications on File Prior to Visit  Medication Sig Dispense Refill   insulin degludec (TRESIBA FLEXTOUCH) 200 UNIT/ML FlexTouch Pen Inject 42 Units into the skin daily. And pen needles 1/day 45 mL 1    loratadine (CLARITIN) 10 MG tablet Take 10 mg by mouth daily.     losartan (COZAAR) 50 MG tablet losartan 50 mg tablet  TAKE 1 TABLET BY MOUTH ONCE DAILY     ondansetron (ZOFRAN-ODT) 4 MG disintegrating tablet Take 1 tablet (4 mg total) by mouth every 8 (eight) hours as needed for nausea or vomiting. 20 tablet 0   rosuvastatin (CRESTOR) 5 MG tablet rosuvastatin 5 mg tablet  TAKE 1 TABLET BY MOUTH ONCE DAILY IN THE EVENING     No current facility-administered medications on file prior to visit.   No Known Allergies Family History  Problem Relation Age of Onset   Diabetes Father    Diabetes Paternal Grandmother    Hypertension Paternal Aunt    Anesthesia problems Neg Hx    Hypotension Neg Hx    Malignant hyperthermia Neg Hx    Pseudochol deficiency Neg Hx    ROS: + see HPI  Past Medical History:  Diagnosis Date   Active labor 12/29/2011   Anemia    FeSO4 taken   Heart murmur    Hypercholesteremia    Hypertension    NSVD (normal spontaneous vaginal delivery) 12/29/2011   Scoliosis    Sickle cell trait (H[facility])    Yeast infection    NOT FREQUENT   Past Surgical History:  Procedure Laterality Date   NO PAST SURGERIES     Social History   Socioeconomic History   Marital status: Single    Spouse name: Not on file   Number of children: 1   Years of education: 13   Highest education level: Not on file  Occupational History   Occupation: [employer]  Tobacco Use   Smoking status: Never   Smokeless tobacco: Never  Vaping Use   Vaping Use: Never used  Substance and Sexual Activity   Alcohol use: No   Drug use: No   Sexual activity: Not Currently    Birth control/protection: None    Comment: currently pregnant  Other Topics Concern   Not on file  Social History Narrative   Not on file   Social Determinants of Health   Financial Resource Strain: Not on file  Food Insecurity: Not on file  Transportation Needs: Not on file  Physical Activity: Not on file   Stress: Not on file  Social Connections: Not on file  Intimate Partner Violence: Not  on file   Current Outpatient Medications on File Prior to Visit  Medication Sig Dispense Refill   insulin degludec (TRESIBA FLEXTOUCH) 200 UNIT/ML FlexTouch Pen Inject 42 Units into the skin daily. And pen needles 1/day 45 mL 1   loratadine (CLARITIN) 10 MG tablet Take 10 mg by mouth daily.     losartan (COZAAR) 50 MG tablet losartan 50 mg tablet  TAKE 1 TABLET BY MOUTH ONCE DAILY     ondansetron (ZOFRAN-ODT) 4 MG disintegrating tablet Take 1 tablet (4 mg total) by mouth every 8 (eight) hours as needed for nausea or vomiting. 20 tablet 0   rosuvastatin (CRESTOR) 5 MG tablet rosuvastatin 5 mg tablet  TAKE 1 TABLET BY MOUTH ONCE DAILY IN THE EVENING     No current facility-administered medications on file prior to visit.   No Known Allergies Family History  Problem Relation Age of Onset   Diabetes Father    Diabetes Paternal Grandmother    Hypertension Paternal Aunt    Anesthesia problems Neg Hx    Hypotension Neg Hx    Malignant hyperthermia Neg Hx    Pseudochol deficiency Neg Hx    PE: BP 128/84 (BP Location: Right Arm, Patient Position: Sitting, Cuff Size: Normal)   Pulse 82   Ht '4\' 11"'$  (1.499 m)   Wt 160 lb 12.8 oz (72.9 kg)   SpO2 99%   BMI 32.48 kg/m  Wt Readings from Last 3 Encounters:  03/27/22 160 lb 12.8 oz (72.9 kg)  11/25/21 155 lb 12.8 oz (70.7 kg)  08/09/21 159 lb (72.1 kg)   Constitutional: overweight, in NAD Eyes:  EOMI, no exophthalmos ENT: no neck masses, no cervical lymphadenopathy Cardiovascular: RRR, No MRG Respiratory: CTA B Musculoskeletal: no deformities Skin:no rashes Neurological: no tremor with outstretched hands  ASSESSMENT: 1. DM2, insulin-dependent, uncontrolled, without long-term complications, but with hyperglycemia  2. HL  PLAN:  1. Patient with longstanding, uncontrolled, type 2 diabetes, on long-acting insulin only, with improved current Let's  last visit.  HbA1c was 7.0% at that time, improved from 7.7%.  At that time, she was not checking sugars consistently.  We did discuss that we cannot use SGLT2 inhibitors and GLP-1 receptor agonists if she is at risk for pregnancy.  We discussed that with diabetes, she will need mealtime insulin.  Goal HbA1c before pregnancy is ideally less than 6%.  At last visit, she was telling me that she may be pregnant but she declined a pregnancy test in office, planning to check at home.  I did advise her to let me know if this was positive.  I strongly advised her to check sugars 3 times a day and occasionally at bedtime -At today's visit, sugars are at goal in the morning and whenever she checks later in the day.  We can continue the same regimen for now. - I suggested to:  Patient Instructions  Please continue: - Tresiba 42 units in a.m  Restart: - Crestor 5 mg daily  Please return in 4-6 months with your sugar log.   - we checked her HbA1c: 6.3% (lower) - advised to check sugars at different times of the day - 1x a day, rotating check times - advised for yearly eye exams >> she is UTD - I refilled her test strips and lancets. - she is off Cozaar, blood pressure is slightly elevated today, but probably due to rushing over here and also mentions dietary indiscretions and salty foods.  For now, she would prefer to continue  without Cozaar.  Will reevaluate at next visit. - return to clinic in 4-6 months   2.  Hyperlipidemia -Reviewed latest lipid panel from 08/2021: All fractions at goal -At last visit she was contemplating a pregnancy so I advised her to stop her Crestor.  At this visit, she denies the possibility of a pregnancy or a desire for immediate pregnancy.  I advised her to restart the low-dose Crestor.  Philemon Kingdom, MD PhD [facility] Endocrinology

## 2022-03-27 NOTE — Patient Instructions (Addendum)
Please continue: - Tresiba 42 units in a.m  Restart: - Crestor 5 mg daily  Please return in 4-6 months with your sugar log.

## 2022-04-07 ENCOUNTER — Emergency Department: Payer: BC Managed Care – PPO

## 2022-04-07 ENCOUNTER — Other Ambulatory Visit: Payer: Self-pay

## 2022-04-07 ENCOUNTER — Emergency Department
Admission: EM | Admit: 2022-04-07 | Discharge: 2022-04-07 | Disposition: A | Discharge status: Home / Self Care | Payer: BC Managed Care – PPO | Attending: Emergency Medicine | Admitting: Emergency Medicine

## 2022-04-07 DIAGNOSIS — D259 Leiomyoma of uterus, unspecified: Secondary | ICD-10-CM | POA: Insufficient documentation

## 2022-04-07 DIAGNOSIS — N9489 Other specified conditions associated with female genital organs and menstrual cycle: Secondary | ICD-10-CM

## 2022-04-07 DIAGNOSIS — I10 Essential (primary) hypertension: Secondary | ICD-10-CM | POA: Diagnosis not present

## 2022-04-07 DIAGNOSIS — R0981 Nasal congestion: Secondary | ICD-10-CM | POA: Diagnosis not present

## 2022-04-07 DIAGNOSIS — R109 Unspecified abdominal pain: Secondary | ICD-10-CM | POA: Diagnosis not present

## 2022-04-07 DIAGNOSIS — R1030 Lower abdominal pain, unspecified: Secondary | ICD-10-CM | POA: Insufficient documentation

## 2022-04-07 DIAGNOSIS — D219 Benign neoplasm of connective and other soft tissue, unspecified: Secondary | ICD-10-CM

## 2022-04-07 LAB — COMPREHENSIVE METABOLIC PANEL
ALT: 11 U/L (ref 0–44)
AST: 16 U/L (ref 15–41)
Albumin: 4.2 g/dL (ref 3.5–5.0)
Alkaline Phosphatase: 63 U/L (ref 38–126)
Anion gap: 9 (ref 5–15)
BUN: 8 mg/dL (ref 6–20)
CO2: 27 mmol/L (ref 22–32)
Calcium: 8.9 mg/dL (ref 8.9–10.3)
Chloride: 101 mmol/L (ref 98–111)
Creatinine, Ser: 0.7 mg/dL (ref 0.44–1.00)
GFR, Estimated: >60 mL/min (ref >60)
Glucose, Bld: 176 mg/dL — ABNORMAL HIGH (ref 70–99)
Potassium: 3.4 mmol/L — ABNORMAL LOW (ref 3.5–5.1)
Sodium: 137 mmol/L (ref 135–145)
Total Bilirubin: 0.7 mg/dL (ref 0.3–1.2)
Total Protein: 7.7 g/dL (ref 6.5–8.1)

## 2022-04-07 LAB — CBC
HCT: 40.3 % (ref 36.0–46.0)
Hemoglobin: 14.2 g/dL (ref 12.0–15.0)
MCH: 28.5 pg (ref 26.0–34.0)
MCHC: 35.2 g/dL (ref 30.0–36.0)
MCV: 80.8 fL (ref 80.0–100.0)
Platelets: 322 10*3/uL (ref 150–400)
RBC: 4.99 MIL/uL (ref 3.87–5.11)
RDW: 13 % (ref 11.5–15.5)
WBC: 11.7 10*3/uL — ABNORMAL HIGH (ref 4.0–10.5)
nRBC: 0 % (ref 0.0–0.2)

## 2022-04-07 LAB — URINALYSIS, ROUTINE W REFLEX MICROSCOPIC
Bilirubin Urine: NEGATIVE
Glucose, UA: 50 mg/dL — AB
Hgb urine dipstick: NEGATIVE
Ketones, ur: NEGATIVE mg/dL
Leukocytes,Ua: NEGATIVE
Nitrite: NEGATIVE
Protein, ur: NEGATIVE mg/dL
Specific Gravity, Urine: 1.016 (ref 1.005–1.030)
pH: 6 (ref 5.0–8.0)

## 2022-04-07 LAB — LIPASE, BLOOD: Lipase: 30 U/L (ref 11–51)

## 2022-04-07 LAB — POC URINE PREG, ED: Preg Test, Ur: NEGATIVE

## 2022-04-07 MED ORDER — ONDANSETRON 4 MG PO TBDP: 4.0000 mg | ORAL_TABLET | Freq: Three times a day (TID) | ORAL | 0 refills | Status: DC | PRN

## 2022-04-07 MED ORDER — IOHEXOL 300 MG/ML  SOLN
100.0000 mL | Freq: Once | INTRAMUSCULAR | Status: AC | PRN
  Administered 2022-04-07: 100 mL via INTRAVENOUS

## 2022-04-07 MED ORDER — IBUPROFEN 800 MG PO TABS: 800.0000 mg | ORAL_TABLET | Freq: Three times a day (TID) | ORAL | 0 refills | Status: DC | PRN

## 2022-04-07 NOTE — ED Triage Notes (Signed)
Patient reports abdominal pain that began yesterday and has been intermittent. Patient c/o pressure in mid-abdominal pain/pelvic. Patient doesn't know if she is pregnant or not.

## 2022-04-07 NOTE — Discharge Instructions (Signed)
I'd recommend taking the Ibuprofen 800 mg three times daily for the next 2-3 days with food. Then take as needed.

## 2022-04-07 NOTE — ED Provider Notes (Signed)
[facility] Provider Note    Event Date/Time   First MD Initiated Contact with Patient 04/07/22 1206     (approximate)   History   Abdominal Pain   HPI  Allison Mathews is a 32 y.o. female here with lower abdominal pain.  The patient states that over the last several days, she has had fairly constant aching, throbbing, lower pelvic pain.  She feels some associated nausea and fatigue.  The pain is diffuse but primarily in the lower abdomen.  She has had some mild vaginal spotting but states she is due to start her period.  She has a history of irregular periods.  She has a history of heavy periods but not particularly painful periods no history of cyst.  Denies any urinary symptoms.  Denies any vaginal discharge or concerns for STI or STD.  Denies any significant diarrhea, nausea, or vomiting.  No fevers.  No recent sick contacts.  She does note she is under significant increased stress recently.     Physical Exam   Triage Vital Signs: ED Triage Vitals  Enc Vitals Group     BP 04/07/22 1041 (!) 135/107     Pulse Rate 04/07/22 1041 89     Resp 04/07/22 1041 18     Temp 04/07/22 1041 98.6 F (37 C)     Temp Source 04/07/22 1041 Oral     SpO2 04/07/22 1041 96 %     Weight 04/07/22 1312 160 lb 11.5 oz (72.9 kg)     Height 04/07/22 1312 '4\' 11"'$  (1.499 m)     Head Circumference --      Peak Flow --      Pain Score 04/07/22 1111 10     Pain Loc --      Pain Edu? --      Excl. in [place]? --     Most recent vital signs: Vitals:   04/07/22 1041 04/07/22 1511  BP: (!) 135/107 (!) 134/92  Pulse: 89 84  Resp: 18 18  Temp: 98.6 F (37 C) 98.3 F (36.8 C)  SpO2: 96% 96%     General: Awake, no distress.  CV:  Good peripheral perfusion.  Resp:  Normal effort.  Lungs clear to auscultation bilaterally. Abd:  No distention.  Moderate tenderness to palpation in the lower abdomen.  No rebound or guarding. Other:  Moist mucous membranes.   ED Results /  Procedures / Treatments   Labs (all labs ordered are listed, but only abnormal results are displayed) Labs Reviewed  COMPREHENSIVE METABOLIC PANEL - Abnormal; Notable for the following components:      Result Value   Potassium 3.4 (*)    Glucose, Bld 176 (*)    All other components within normal limits  CBC - Abnormal; Notable for the following components:   WBC 11.7 (*)    All other components within normal limits  URINALYSIS, ROUTINE W REFLEX MICROSCOPIC - Abnormal; Notable for the following components:   Color, Urine YELLOW (*)    APPearance HAZY (*)    Glucose, UA 50 (*)    All other components within normal limits  LIPASE, BLOOD  POC URINE PREG, ED     EKG Normal sinus rhythm, ventricular rate 74.  PR 134, QRS 84, QTc 4 1.  No acute ST elevations or depressions.   RADIOLOGY CT abdomen/pelvis: No intestinal obstruction or pneumoperitoneum, uterine fibroids with ectasia of vessels consistent with pelvic congestion syndrome, likely decompressed rectosigmoid colon  I also independently reviewed and agree with radiologist interpretations.   PROCEDURES:  Critical Care performed: No   MEDICATIONS ORDERED IN ED: Medications  iohexol (OMNIPAQUE) 300 MG/ML solution 100 mL (100 mLs Intravenous Contrast Given 04/07/22 1338)     IMPRESSION / MDM / ASSESSMENT AND PLAN / ED COURSE  I reviewed the triage vital signs and the nursing notes.                              Differential diagnosis includes, but is not limited to, enteritis, colitis, viral GI illness, foodborne illness, dysmenorrhea, ovarian cyst, pelvic congestion, UTI  Patient's presentation is most consistent with acute presentation with potential threat to life or bodily function.  The patient is on the cardiac monitor to evaluate for evidence of arrhythmia and/or significant heart rate changes.  32 year old female here with diffuse but primarily pelvic abdominal pain.  Symptoms are cramp-like.  No diarrhea,  nausea, or vomiting.  No fevers or chills.  Lab work shows mild, likely reactive leukocytosis.  No fevers or signs of sepsis.  CMP shows mild hypokalemia, normal LFTs.  Lipase is normal.  Urinalysis unremarkable.  Urine pregnancy is negative.  CT of the abdomen and pelvis obtained, reviewed.  She has likely underdistention of the colon which I do not feel represents colitis given the absence of any diarrhea.  More likely, the patient has inhomogenous density in the myometrium with fibroids and fluid in the endometrial canal consistent with likely menstrual period suspect this and pelvic congestion syndrome contributing to her suprapubic and pelvic abdominal pain.  Will treat with NSAIDs and supportive care.  In the event of possible colitis, symptoms been present for only several days, she has no fever, no tenderness in the area of reported colitis, and do not feel this would be a bacterial colitis regardless.  Will discharge with supportive care and outpatient follow-up.   FINAL CLINICAL IMPRESSION(S) / ED DIAGNOSES   Final diagnoses:  Fibroid  Pelvic congestion     Rx / DC Orders   ED Discharge Orders          Ordered    ibuprofen (ADVIL) 800 MG tablet  Every 8 hours PRN        04/07/22 1501    ondansetron (ZOFRAN-ODT) 4 MG disintegrating tablet  Every 8 hours PRN        04/07/22 1501             Note:  This document was prepared using Dragon voice recognition software and may include unintentional dictation errors.   Duffy Bruce, MD 04/07/22 1535

## 2022-05-20 DIAGNOSIS — D573 Sickle-cell trait: Secondary | ICD-10-CM | POA: Insufficient documentation

## 2022-05-20 DIAGNOSIS — E119 Type 2 diabetes mellitus without complications: Secondary | ICD-10-CM | POA: Insufficient documentation

## 2022-05-21 DIAGNOSIS — Z6830 Body mass index (BMI) 30.0-30.9, adult: Secondary | ICD-10-CM | POA: Diagnosis not present

## 2022-05-21 DIAGNOSIS — Z01419 Encounter for gynecological examination (general) (routine) without abnormal findings: Secondary | ICD-10-CM | POA: Diagnosis not present

## 2022-05-21 DIAGNOSIS — I10 Essential (primary) hypertension: Secondary | ICD-10-CM | POA: Insufficient documentation

## 2022-06-07 DIAGNOSIS — N898 Other specified noninflammatory disorders of vagina: Secondary | ICD-10-CM | POA: Diagnosis not present

## 2022-06-07 DIAGNOSIS — R3915 Urgency of urination: Secondary | ICD-10-CM | POA: Diagnosis not present

## 2022-08-07 DIAGNOSIS — H65191 Other acute nonsuppurative otitis media, right ear: Secondary | ICD-10-CM | POA: Diagnosis not present

## 2022-08-07 DIAGNOSIS — R5383 Other fatigue: Secondary | ICD-10-CM | POA: Diagnosis not present

## 2022-10-02 ENCOUNTER — Other Ambulatory Visit: Payer: Self-pay | Admitting: Internal Medicine

## 2022-10-02 ENCOUNTER — Ambulatory Visit: Payer: BC Managed Care – PPO | Admitting: Internal Medicine

## 2022-10-02 ENCOUNTER — Encounter: Payer: Self-pay | Admitting: Internal Medicine

## 2022-10-02 VITALS — BP 124/80 | HR 92 | Ht 59.0 in | Wt 163.8 lb

## 2022-10-02 DIAGNOSIS — E785 Hyperlipidemia, unspecified: Secondary | ICD-10-CM

## 2022-10-02 DIAGNOSIS — Z794 Long term (current) use of insulin: Secondary | ICD-10-CM

## 2022-10-02 DIAGNOSIS — E1165 Type 2 diabetes mellitus with hyperglycemia: Secondary | ICD-10-CM

## 2022-10-02 DIAGNOSIS — E119 Type 2 diabetes mellitus without complications: Secondary | ICD-10-CM

## 2022-10-02 DIAGNOSIS — Z7984 Long term (current) use of oral hypoglycemic drugs: Secondary | ICD-10-CM | POA: Diagnosis not present

## 2022-10-02 LAB — HEMOGLOBIN A1C: Hemoglobin A1C: 7.6

## 2022-10-02 MED ORDER — SITAGLIPTIN PHOSPHATE 100 MG PO TABS: 100.0000 mg | ORAL_TABLET | Freq: Every day | ORAL | 3 refills | Status: DC

## 2022-10-02 MED ORDER — TRESIBA FLEXTOUCH 200 UNIT/ML ~~LOC~~ SOPN: 36.0000 [IU] | PEN_INJECTOR | Freq: Every day | SUBCUTANEOUS | 1 refills | Status: DC

## 2022-10-02 MED ORDER — INSULIN PEN NEEDLE 32G X 4 MM MISC: 3 refills | Status: DC

## 2022-10-02 MED ORDER — ROSUVASTATIN CALCIUM 5 MG PO TABS: ORAL_TABLET | ORAL | 3 refills | Status: DC

## 2022-10-02 NOTE — Progress Notes (Signed)
Patient ID: Allison Mathews, female   DOB: 08-Mar-1991, 32 y.o.   MRN: [id number]  HPI: Allison Mathews is a 32 y.o.-year-old female, returning for follow-up for DM2, dx in 2019, insulin-dependent since 2021, uncontrolled, without long-term complications. Pt. previously saw Dr. Everardo All, but last visit with me 6 months ago.   Interim history: No increased urination, blurry vision (unless sugars are high), nausea, chest pain. She feels stressed  - mother hospitalized. She relaxed her diet recently. Sugars were higher.  Reviewed HbA1c: Lab Results  Component Value Date   HGBA1C 6.3 (A) 03/27/2022   HGBA1C 7.0 (A) 11/25/2021   HGBA1C 7.7 (A) 08/09/2021   HGBA1C 9.3 (A) 03/20/2021  02/14/2021: HbA1c 10.7%  Pt is on a regimen of: - Tresiba 42 units daily She stopped metformin, Januvia, and Ozempic due to weight loss, nausea, hair loss.  Pt checks her sugars 0-1x a day and they are: - am: 93-100 >> 80-90 - 2h after b'fast: n/c - before lunch: 110 >> 120 - 2h after lunch: up to 170 >> 174-180 - before dinner: 83 >> n/c - 2h after dinner: occas. 220 >> 170-180, 220 - bedtime: n/c - nighttime: n/c Lowest sugar was 80 >> 83 >> 64; she has hypoglycemia awareness at 90.  Highest sugar was 200 >> 220 >> 220 postprandially  Glucometer:One Touch A CGM was not affordable.  - no CKD, last BUN/creatinine:  Lab Results  Component Value Date   BUN 8 04/07/2022   BUN 9 08/09/2021   CREATININE 0.70 04/07/2022   CREATININE 0.61 08/09/2021  Prev. On Cozaar 50 mg daily - off since 11/2021.  -+ HL; last set of lipids:  2021-08-29     LDL Chol Calc (NIH) 69   0-99  CHOL/HDL 2.9   2.0-4.0  Cholesterol 130   <200  HDLD 45   30-85  LDL Chol Calc (NIH) 69   0-99  NHDL 85   0-129  Triglyceride 78   0-199   No results found for: "CHOL", "HDL", "LDLCALC", "LDLDIRECT", "TRIG", "CHOLHDL" We stopped Crestor 5 mg daily at last visit, due to the possibility of pregnancy.  I advised her to restart  this in 03/2022, as she mentioned no desire for pregnancy at that time. She did not yet.  - + numbness and tingling in her feet.  Last foot exam 03/2022.  - Last eye exam 03/2022: No DR reportedly.  ROS: + see HPI  Past Medical History:  Diagnosis Date   Active labor 12/29/2011   Anemia    FeSO4 taken   Heart murmur    Hypercholesteremia    Hypertension    NSVD (normal spontaneous vaginal delivery) 12/29/2011   Scoliosis    Sickle cell trait (HCC)    Yeast infection    NOT FREQUENT   Past Surgical History:  Procedure Laterality Date   NO PAST SURGERIES     Social History   Socioeconomic History   Marital status: Single    Spouse name: Not on file   Number of children: 1   Years of education: 9   Highest education level: Not on file  Occupational History   Occupation: [employer]  Tobacco Use   Smoking status: Never   Smokeless tobacco: Never  Vaping Use   Vaping Use: Never used  Substance and Sexual Activity   Alcohol use: No   Drug use: No   Sexual activity: Not Currently    Birth control/protection: None    Comment: currently pregnant  Other Topics Concern   Not on file  Social History Narrative   Not on file   Social Determinants of Health   Financial Resource Strain: Not on file  Food Insecurity: Not on file  Transportation Needs: Not on file  Physical Activity: Not on file  Stress: Not on file  Social Connections: Not on file  Intimate Partner Violence: Not on file   Current Outpatient Medications on File Prior to Visit  Medication Sig Dispense Refill   glucose blood test strip Check sugars 2x a day 200 each 3   ibuprofen (ADVIL) 800 MG tablet Take 1 tablet (800 mg total) by mouth every 8 (eight) hours as needed for moderate pain or cramping. 21 tablet 0   insulin degludec (TRESIBA FLEXTOUCH) 200 UNIT/ML FlexTouch Pen Inject 42 Units into the skin daily. And pen needles 1/day 45 mL 1   loratadine (CLARITIN) 10 MG tablet Take 10 mg by mouth  daily.     losartan (COZAAR) 50 MG tablet losartan 50 mg tablet  TAKE 1 TABLET BY MOUTH ONCE DAILY     ondansetron (ZOFRAN-ODT) 4 MG disintegrating tablet Take 1 tablet (4 mg total) by mouth every 8 (eight) hours as needed for nausea or vomiting. 20 tablet 0   OneTouch Delica Lancets 33G MISC Check sugars 2x a day 200 each 3   rosuvastatin (CRESTOR) 5 MG tablet rosuvastatin 5 mg tablet  TAKE 1 TABLET BY MOUTH ONCE DAILY IN THE EVENING     No current facility-administered medications on file prior to visit.   No Known Allergies Family History  Problem Relation Age of Onset   Diabetes Father    Diabetes Paternal Grandmother    Hypertension Paternal Aunt    Anesthesia problems Neg Hx    Hypotension Neg Hx    Malignant hyperthermia Neg Hx    Pseudochol deficiency Neg Hx    ROS: + see HPI  Past Medical History:  Diagnosis Date   Active labor 12/29/2011   Anemia    FeSO4 taken   Heart murmur    Hypercholesteremia    Hypertension    NSVD (normal spontaneous vaginal delivery) 12/29/2011   Scoliosis    Sickle cell trait (HCC)    Yeast infection    NOT FREQUENT   Past Surgical History:  Procedure Laterality Date   NO PAST SURGERIES     Social History   Socioeconomic History   Marital status: Single    Spouse name: Not on file   Number of children: 1   Years of education: 34   Highest education level: Not on file  Occupational History   Occupation: [employer]  Tobacco Use   Smoking status: Never   Smokeless tobacco: Never  Vaping Use   Vaping Use: Never used  Substance and Sexual Activity   Alcohol use: No   Drug use: No   Sexual activity: Not Currently    Birth control/protection: None    Comment: currently pregnant  Other Topics Concern   Not on file  Social History Narrative   Not on file   Social Determinants of Health   Financial Resource Strain: Not on file  Food Insecurity: Not on file  Transportation Needs: Not on file  Physical Activity: Not on  file  Stress: Not on file  Social Connections: Not on file  Intimate Partner Violence: Not on file   Current Outpatient Medications on File Prior to Visit  Medication Sig Dispense Refill   glucose blood test strip Check  sugars 2x a day 200 each 3   ibuprofen (ADVIL) 800 MG tablet Take 1 tablet (800 mg total) by mouth every 8 (eight) hours as needed for moderate pain or cramping. 21 tablet 0   insulin degludec (TRESIBA FLEXTOUCH) 200 UNIT/ML FlexTouch Pen Inject 42 Units into the skin daily. And pen needles 1/day 45 mL 1   loratadine (CLARITIN) 10 MG tablet Take 10 mg by mouth daily.     losartan (COZAAR) 50 MG tablet losartan 50 mg tablet  TAKE 1 TABLET BY MOUTH ONCE DAILY     ondansetron (ZOFRAN-ODT) 4 MG disintegrating tablet Take 1 tablet (4 mg total) by mouth every 8 (eight) hours as needed for nausea or vomiting. 20 tablet 0   OneTouch Delica Lancets 33G MISC Check sugars 2x a day 200 each 3   rosuvastatin (CRESTOR) 5 MG tablet rosuvastatin 5 mg tablet  TAKE 1 TABLET BY MOUTH ONCE DAILY IN THE EVENING     No current facility-administered medications on file prior to visit.   No Known Allergies Family History  Problem Relation Age of Onset   Diabetes Father    Diabetes Paternal Grandmother    Hypertension Paternal Aunt    Anesthesia problems Neg Hx    Hypotension Neg Hx    Malignant hyperthermia Neg Hx    Pseudochol deficiency Neg Hx    PE: BP 124/80   Pulse 92   Ht 4\' 11"  (1.499 m)   Wt 163 lb 12.8 oz (74.3 kg)   SpO2 99%   BMI 33.08 kg/m  Wt Readings from Last 3 Encounters:  10/02/22 163 lb 12.8 oz (74.3 kg)  04/07/22 160 lb 11.5 oz (72.9 kg)  03/27/22 160 lb 12.8 oz (72.9 kg)   Constitutional: overweight, in NAD Eyes:  EOMI, no exophthalmos ENT: no neck masses, no cervical lymphadenopathy Cardiovascular: RRR, No MRG Respiratory: CTA B Musculoskeletal: no deformities Skin:no rashes Neurological: no tremor with outstretched hands  ASSESSMENT: 1. DM2,  insulin-dependent, uncontrolled, without long-term complications, but with hyperglycemia  2. HL  PLAN:  1. Patient with longstanding, uncontrolled, type 2 diabetes, on long-acting insulin only, with improved control.  At last visit, HbA1c was 6.3% improved.  Sugars were at goal in the morning and whenever she was checking later in the day.  We did not change her regimen. -At today's visit, sugars are at goal in the morning, usually under 90s, but they increase after the 200s after meals.  We discussed that we need better mealtime coverage.  We discussed about options.  She would not want to be on any medication that can cause weight loss.  Therefore, we decided to start a DPP 4 inhibitor, Januvia, and decrease her dose of Tresiba slightly. -She is also planning to start improving her diet - I suggested to:  Patient Instructions  Please decrease: - Tresiba 36 units in a.m  Start: - Januvia 100 mg before b'fast  Also restart: - Crestor 5 mg daily  Please return in 4-6 months with your sugar log.    - we checked her HbA1c: 7.6% (higher) - advised to check sugars at different times of the day - 1x a day, rotating check times - advised for yearly eye exams >> she is UTD - at last visit she was off Cozaar.  Blood pressure was slightly elevated but she mentions dietary indiscretions and salty foods.  She preferred to stay off Cozaar at that time.  At today's visit, she mentions that she is on amlodipine 5  mg daily.  Blood pressure is close to goal. - return to clinic in 6 months   2.  Hyperlipidemia - Reviewed latest lipid panel from 08/2021: All fractions at goal: No results found for: "CHOL", "HDL", "LDLCALC", "LDLDIRECT", "TRIG", "CHOLHDL" -In the past, I recommended to stop Crestor (she was using 5 mg daily) as she was contemplating a pregnancy.  At last visit, she mentions no desire for pregnancy so I advised her to restart the low-dose Crestor at that time.  She did not do so yet so we  again discussed about starting it.  I sent a new prescription to her pharmacy.  Carlus Pavlov, MD PhD [facility] Endocrinology

## 2022-10-02 NOTE — Patient Instructions (Addendum)
Please decrease: - Tresiba 36 units in a.m  Start: - Januvia 100 mg before b'fast  Also restart: - Crestor 5 mg daily  Please return in 4-6 months with your sugar log.

## 2022-10-03 NOTE — Addendum Note (Signed)
Addended by: Pollie Meyer on: 10/03/2022 08:00 AM   Modules accepted: Orders

## 2022-12-16 DIAGNOSIS — J069 Acute upper respiratory infection, unspecified: Secondary | ICD-10-CM | POA: Diagnosis not present

## 2022-12-16 DIAGNOSIS — Z1152 Encounter for screening for COVID-19: Secondary | ICD-10-CM | POA: Diagnosis not present

## 2022-12-16 DIAGNOSIS — H6121 Impacted cerumen, right ear: Secondary | ICD-10-CM | POA: Diagnosis not present

## 2022-12-16 DIAGNOSIS — H65191 Other acute nonsuppurative otitis media, right ear: Secondary | ICD-10-CM | POA: Diagnosis not present

## 2023-01-19 DIAGNOSIS — R1031 Right lower quadrant pain: Secondary | ICD-10-CM | POA: Diagnosis not present

## 2023-01-19 DIAGNOSIS — N760 Acute vaginitis: Secondary | ICD-10-CM | POA: Diagnosis not present

## 2023-01-19 DIAGNOSIS — N921 Excessive and frequent menstruation with irregular cycle: Secondary | ICD-10-CM | POA: Diagnosis not present

## 2023-01-22 DIAGNOSIS — R1032 Left lower quadrant pain: Secondary | ICD-10-CM | POA: Diagnosis not present

## 2023-02-08 DIAGNOSIS — N898 Other specified noninflammatory disorders of vagina: Secondary | ICD-10-CM | POA: Diagnosis not present

## 2023-02-24 ENCOUNTER — Encounter: Payer: Self-pay | Admitting: Internal Medicine

## 2023-02-24 ENCOUNTER — Ambulatory Visit: Payer: BC Managed Care – PPO | Admitting: Internal Medicine

## 2023-02-24 VITALS — BP 138/90 | HR 98 | Ht 59.0 in | Wt 164.0 lb

## 2023-02-24 DIAGNOSIS — E1165 Type 2 diabetes mellitus with hyperglycemia: Secondary | ICD-10-CM | POA: Diagnosis not present

## 2023-02-24 DIAGNOSIS — Z794 Long term (current) use of insulin: Secondary | ICD-10-CM | POA: Diagnosis not present

## 2023-02-24 DIAGNOSIS — E785 Hyperlipidemia, unspecified: Secondary | ICD-10-CM | POA: Diagnosis not present

## 2023-02-24 LAB — GLUCOSE, POCT (MANUAL RESULT ENTRY): POC Glucose: 100 mg/dL — AB (ref 70–99)

## 2023-02-24 LAB — POCT GLYCOSYLATED HEMOGLOBIN (HGB A1C): Hemoglobin A1C: 7.5 % — AB (ref 4.0–5.6)

## 2023-02-24 MED ORDER — GLIPIZIDE 5 MG PO TABS: 5.0000 mg | ORAL_TABLET | Freq: Every day | ORAL | 3 refills | Status: DC

## 2023-02-24 MED ORDER — GLUCOSE BLOOD VI STRP: ORAL_STRIP | 3 refills | Status: DC

## 2023-02-24 MED ORDER — DEXCOM G7 SENSOR MISC: 3.0000 | 4 refills | Status: DC

## 2023-02-24 MED ORDER — ONETOUCH DELICA LANCETS 33G MISC: 3 refills | Status: DC

## 2023-02-24 NOTE — Patient Instructions (Addendum)
Please continue: - Tresiba 36 units in a.m - Januvia 100 mg before b'fast  Try to use: - Glipizide 5 mg before dinner (especially with more carbs)  Also continue: - Crestor 5 mg daily  Please return in 4-6 months with your sugar log.

## 2023-02-24 NOTE — Progress Notes (Unsigned)
Patient ID: Allison Mathews, female   DOB: [birth year], 32 y.o.   MRN: [id number]  HPI: Allison Mathews is a 32 y.o.-year-old female, returning for follow-up for DM2, dx in 2019, insulin-dependent since 2021, uncontrolled, without long-term complications. Pt. previously saw Dr. Everardo All, but last visit with me 5 months ago.   Interim history: She recently had increased urination during an episode of hyperglycemia, but no nausea, chest pain, blurry vision. She describes that 2 days ago she had very high blood sugars, in the 400s, after eating [place].  Sugars improved afterwards but not quite to normal.  Reviewed HbA1c: Lab Results  Component Value Date   HGBA1C 7.6 10/02/2022   HGBA1C 6.3 (A) 03/27/2022   HGBA1C 7.0 (A) 11/25/2021   HGBA1C 7.7 (A) 08/09/2021   HGBA1C 9.3 (A) 03/20/2021  02/14/2021: HbA1c 10.7%  Pt is on a regimen of: - Tresiba 42 >> 36 units daily - Januvia 100 mg daily-restarted 09/2022 She prev. stopped metformin (nausea/hair loss), Januvia, and Ozempic due to weight loss, nausea, hair loss.  Pt checks her sugars 0-1x a day and they are: - am: 93-100 >> 80-90 >> 90-120 - 2h after b'fast: n/c - before lunch: 110 >> 120 >> n/c - 2h after lunch: up to 170 >> 174-180 >> 130-140 - before dinner: 83 >> n/c - 2h after dinner: occas. 220 >> 170-180, 220 >> 170-180, 443 - bedtime: n/c - nighttime: n/c Lowest sugar was 80 >> 83 >> 64 >> 90; she has hypoglycemia awareness at 90.  Highest sugar was 200 >> 220 >> 220 postprandially >> 443 (pasta).  Glucometer:One Touch A CGM was not affordable.  - no CKD, last BUN/creatinine:  Lab Results  Component Value Date   BUN 8 04/07/2022   BUN 9 08/09/2021   CREATININE 0.70 04/07/2022   CREATININE 0.61 08/09/2021  Prev. On Cozaar 50 mg daily - off since 11/2021.  -+ HL; last set of lipids:  2021-08-29     LDL Chol Calc (NIH) 69   0-99  CHOL/HDL 2.9   2.0-4.0  Cholesterol 130   <200  HDLD 45   30-85  LDL Chol Calc  (NIH) 69   2-13  NHDL 85   0-129  Triglyceride 78   0-199   No results found for: "CHOL", "HDL", "LDLCALC", "LDLDIRECT", "TRIG", "CHOLHDL" We stopped Crestor 5 mg daily at last visit, due to the possibility of pregnancy.  I advised her to restart this in 03/2022, as she mentioned no desire for pregnancy at that time. She started this.  - + numbness and tingling in her feet.  Last foot exam 03/27/2022.  - Last eye exam 03/2022: No DR reportedly.  ROS: + see HPI  Past Medical History:  Diagnosis Date   Active labor 12/29/2011   Anemia    FeSO4 taken   Heart murmur    Hypercholesteremia    Hypertension    NSVD (normal spontaneous vaginal delivery) 12/29/2011   Scoliosis    Sickle cell trait (HCC)    Yeast infection    NOT FREQUENT   Past Surgical History:  Procedure Laterality Date   NO PAST SURGERIES     Social History   Socioeconomic History   Marital status: Single    Spouse name: Not on file   Number of children: 1   Years of education: 32   Highest education level: Not on file  Occupational History   Occupation: [employer]  Tobacco Use   Smoking status: Never  Smokeless tobacco: Never  Vaping Use   Vaping status: Never Used  Substance and Sexual Activity   Alcohol use: No   Drug use: No   Sexual activity: Not Currently    Birth control/protection: None    Comment: currently pregnant  Other Topics Concern   Not on file  Social History Narrative   Not on file   Social Determinants of Health   Financial Resource Strain: Not on file  Food Insecurity: Low Risk  (12/16/2022)   Received from [facility]   Hunger Vital Sign    Worried About Running Out of Food in the Last Year: Never true    Ran Out of Food in the Last Year: Never true  Transportation Needs: No Transportation Needs (12/16/2022)   Received from [facility]    In the past 12 months, has lack of reliable transportation kept you from medical appointments, meetings, work  or from getting things needed for daily living? : No  Physical Activity: Not on file  Stress: Not on file  Social Connections: Not on file  Intimate Partner Violence: Not on file   Current Outpatient Medications on File Prior to Visit  Medication Sig Dispense Refill   amLODipine (NORVASC) 5 MG tablet 1 tablet Orally Once a day for 90 days     glucose blood test strip Check sugars 2x a day 200 each 3   ibuprofen (ADVIL) 800 MG tablet Take 1 tablet (800 mg total) by mouth every 8 (eight) hours as needed for moderate pain or cramping. 21 tablet 0   insulin degludec (TRESIBA FLEXTOUCH) 200 UNIT/ML FlexTouch Pen Inject 36-38 Units into the skin daily. 18 mL 1   Insulin Pen Needle 32G X 4 MM MISC Use 1x a day 100 each 3   loratadine (CLARITIN) 10 MG tablet Take 10 mg by mouth daily.     losartan (COZAAR) 50 MG tablet losartan 50 mg tablet  TAKE 1 TABLET BY MOUTH ONCE DAILY     ondansetron (ZOFRAN-ODT) 4 MG disintegrating tablet Take 1 tablet (4 mg total) by mouth every 8 (eight) hours as needed for nausea or vomiting. (Patient not taking: Reported on 10/02/2022) 20 tablet 0   OneTouch Delica Lancets 33G MISC Check sugars 2x a day 200 each 3   rosuvastatin (CRESTOR) 5 MG tablet rosuvastatin 5 mg tablet  TAKE 1 TABLET BY MOUTH ONCE DAILY IN THE EVENING 90 tablet 3   sitaGLIPtin (JANUVIA) 100 MG tablet Take 1 tablet (100 mg total) by mouth daily. 90 tablet 3   No current facility-administered medications on file prior to visit.   No Known Allergies Family History  Problem Relation Age of Onset   Diabetes Father    Diabetes Paternal Grandmother    Hypertension Paternal Aunt    Anesthesia problems Neg Hx    Hypotension Neg Hx    Malignant hyperthermia Neg Hx    Pseudochol deficiency Neg Hx    ROS: + see HPI  Past Medical History:  Diagnosis Date   Active labor 12/29/2011   Anemia    FeSO4 taken   Heart murmur    Hypercholesteremia    Hypertension    NSVD (normal spontaneous vaginal  delivery) 12/29/2011   Scoliosis    Sickle cell trait (HCC)    Yeast infection    NOT FREQUENT   Past Surgical History:  Procedure Laterality Date   NO PAST SURGERIES     Social History   Socioeconomic History   Marital  status: Single    Spouse name: Not on file   Number of children: 1   Years of education: 13   Highest education level: Not on file  Occupational History   Occupation: [employer]  Tobacco Use   Smoking status: Never   Smokeless tobacco: Never  Vaping Use   Vaping status: Never Used  Substance and Sexual Activity   Alcohol use: No   Drug use: No   Sexual activity: Not Currently    Birth control/protection: None    Comment: currently pregnant  Other Topics Concern   Not on file  Social History Narrative   Not on file   Social Determinants of Health   Financial Resource Strain: Not on file  Food Insecurity: Low Risk  (12/16/2022)   Received from [facility]   Hunger Vital Sign    Worried About Running Out of Food in the Last Year: Never true    Ran Out of Food in the Last Year: Never true  Transportation Needs: No Transportation Needs (12/16/2022)   Received from [facility]    In the past 12 months, has lack of reliable transportation kept you from medical appointments, meetings, work or from getting things needed for daily living? : No  Physical Activity: Not on file  Stress: Not on file  Social Connections: Not on file  Intimate Partner Violence: Not on file   Current Outpatient Medications on File Prior to Visit  Medication Sig Dispense Refill   amLODipine (NORVASC) 5 MG tablet 1 tablet Orally Once a day for 90 days     glucose blood test strip Check sugars 2x a day 200 each 3   ibuprofen (ADVIL) 800 MG tablet Take 1 tablet (800 mg total) by mouth every 8 (eight) hours as needed for moderate pain or cramping. 21 tablet 0   insulin degludec (TRESIBA FLEXTOUCH) 200 UNIT/ML FlexTouch Pen Inject 36-38 Units into the skin  daily. 18 mL 1   Insulin Pen Needle 32G X 4 MM MISC Use 1x a day 100 each 3   loratadine (CLARITIN) 10 MG tablet Take 10 mg by mouth daily.     losartan (COZAAR) 50 MG tablet losartan 50 mg tablet  TAKE 1 TABLET BY MOUTH ONCE DAILY     ondansetron (ZOFRAN-ODT) 4 MG disintegrating tablet Take 1 tablet (4 mg total) by mouth every 8 (eight) hours as needed for nausea or vomiting. (Patient not taking: Reported on 10/02/2022) 20 tablet 0   OneTouch Delica Lancets 33G MISC Check sugars 2x a day 200 each 3   rosuvastatin (CRESTOR) 5 MG tablet rosuvastatin 5 mg tablet  TAKE 1 TABLET BY MOUTH ONCE DAILY IN THE EVENING 90 tablet 3   sitaGLIPtin (JANUVIA) 100 MG tablet Take 1 tablet (100 mg total) by mouth daily. 90 tablet 3   No current facility-administered medications on file prior to visit.   No Known Allergies Family History  Problem Relation Age of Onset   Diabetes Father    Diabetes Paternal Grandmother    Hypertension Paternal Aunt    Anesthesia problems Neg Hx    Hypotension Neg Hx    Malignant hyperthermia Neg Hx    Pseudochol deficiency Neg Hx    PE: BP (!) 138/90   Pulse 98   Ht 4\' 11"  (1.499 m)   Wt 164 lb (74.4 kg)   SpO2 99%   BMI 33.12 kg/m  Wt Readings from Last 3 Encounters:  02/24/23 164 lb (74.4 kg)  10/02/22 163 lb 12.8 oz (74.3 kg)  04/07/22 160 lb 11.5 oz (72.9 kg)   Constitutional: overweight, in NAD Eyes:  EOMI, no exophthalmos ENT: no neck masses, no cervical lymphadenopathy Cardiovascular: Tachycardia, RR, No MRG Respiratory: CTA B Musculoskeletal: no deformities Skin:no rashes Neurological: no tremor with outstretched hands Diabetic Foot Exam - Simple   Simple Foot Form Diabetic Foot exam was performed with the following findings: Yes 02/24/2023  3:24 PM  Visual Inspection No deformities, no ulcerations, no other skin breakdown bilaterally: Yes Sensation Testing Intact to touch and monofilament testing bilaterally: Yes Pulse Check Posterior Tibialis  and Dorsalis pulse intact bilaterally: Yes Comments    ASSESSMENT: 1. DM2, insulin-dependent, uncontrolled, without long-term complications, but with hyperglycemia  2. HL  PLAN:  1. Patient with longstanding, uncontrolled, type 2 diabetes on long acting insulin only at last visit, to which we added a DPP 4 inhibitor at that time.  Sugars were at goal in the morning, usually in the 90s but they were increasing above 200s after meals.  We backed off her Tresiba dose and recommended to add a DPP 4 inhibitor as she did not want to start any medication with weight loss as a side effect. Hb A1c at last visit was higher, at 7.6%, after relaxing her diet. -At today's visit, she mentions that her sugars improved after last visit but she had significant hyperglycemic spikes in the 440s 2 nights ago after eating pasta.  She also felt poorly around the time of the episode.  She had increased urination and feeling tired.  Sugars improved since then, but they were still in the 170s this morning.  At today's visit, at the time of the appointment, we checked her CBG and this was 100 but she mentions that he that she had not eaten lunch. -For now, we discussed about continuing the same regimen but I did recommend to add glipizide before dinner especially if she does not have a light dinner.  As mentioned above, she would prefer to avoid medications with weight loss as a side effect. -At today's visit, I sent a prescription for new test strips and lancets to her pharmacy.  I also suggested to check a pregnancy test to see if this is not a cause for her unexpected blood sugars.  She also has an appointment with PCP coming up in 2 days. - I suggested to:  Patient Instructions  Please continue: - Tresiba 36 units in a.m - Januvia 100 mg before b'fast  Try to use: - Glipizide 5 mg before dinner (especially with more carbs)  Also continue: - Crestor 5 mg daily  Please return in 4-6 months with your sugar log.    - we checked her HbA1c: 7.5% (lower) - advised to check sugars at different times of the day - 1x a day, rotating check times - advised for yearly eye exams >> she is UTD - will check annual labs today - return to clinic in 4-6 months  2.  Hyperlipidemia -Reviewed latest lipid panel from 08/2021: Fractions at goal: No results found for: "CHOL", "HDL", "LDLCALC", "LDLDIRECT", "TRIG", "CHOLHDL" -In the past, she was contemplating a pregnancy and we had her off statins, but at last visit she agreed to start Crestor 5 mg daily since no pregnancy was planned.  She does take this now. -Will check a lipid panel today  Component     Latest Ref Rng 02/24/2023  Sodium     135 - 145 mEq/L 138  Potassium     3.5 - 5.1 mEq/L 3.8   Chloride     96 - 112 mEq/L 102   CO2     19 - 32 mEq/L 27   Glucose     70 - 99 mg/dL 884 (H)   BUN     6 - 23 mg/dL 11   Creatinine     1.66 - 1.20 mg/dL 0.63   Calcium     8.4 - 10.5 mg/dL 9.2   Hemoglobin K1S     4.0 - 5.6 % 7.5 !   Total Protein     6.0 - 8.3 g/dL 7.5   Albumin     3.5 - 5.2 g/dL 4.7   AST     0 - 37 U/L 12   ALT     0 - 35 U/L 10   Alkaline Phosphatase     39 - 117 U/L 70   Total Bilirubin     0.2 - 1.2 mg/dL 0.5   GFR     >01.09 mL/min 113.25   POC Glucose     70 - 99 mg/dl 323 !   HCG, Total, QN     mIU/mL <5   Cholesterol     0 - 200 mg/dL 557   Triglycerides     0.0 - 149.0 mg/dL 32.2   HDL Cholesterol     >39.00 mg/dL 02.54   VLDL     0.0 - 40.0 mg/dL 27.0   LDL (calc)     0 - 99 mg/dL 623 (H)   Total CHOL/HDL Ratio 4   NonHDL 128.52   Microalb, Ur     0.0 - 1.9 mg/dL 2.1 (H)   Creatinine,U     mg/dL 762.8   MICROALB/CREAT RATIO     0.0 - 30.0 mg/g 1.0   ACR is normal. She is not pregnant. CMP normal with the exception of the slightly high glucose.   LDL above target, increased from before.  I will check with her to see if she is taking the Crestor.  Carlus Pavlov, MD PhD [facility]  Endocrinology

## 2023-02-24 NOTE — Progress Notes (Deleted)
Patient ID: MODENE CLOER, female   DOB: 1990-08-14, 32 y.o.   MRN: [id number]  HPI: Allison Mathews is a 32 y.o.-year-old female, returning for follow-up for DM2, dx in 2019, insulin-dependent since 2021, uncontrolled, without long-term complications. Pt. previously saw Dr. Everardo All, but last visit with me 5 months ago.   Interim history: No increased urination, blurry vision (unless sugars are high), nausea, chest pain. At last visit, she had a lot of stress with her mother being in the hospital.  Sugars are higher.  Reviewed HbA1c: Lab Results  Component Value Date   HGBA1C 7.6 10/02/2022   HGBA1C 6.3 (A) 03/27/2022   HGBA1C 7.0 (A) 11/25/2021   HGBA1C 7.7 (A) 08/09/2021   HGBA1C 9.3 (A) 03/20/2021  02/14/2021: HbA1c 10.7%  Pt is on a regimen of: - Tresiba 42 >> 36 units daily - Januvia 100 mg daily-restarted 09/2022 She prev. stopped metformin, Januvia, and Ozempic due to weight loss, nausea, hair loss.  Pt checks her sugars 0-1x a day and they are: - am: 93-100 >> 80-90 - 2h after b'fast: n/c - before lunch: 110 >> 120 - 2h after lunch: up to 170 >> 174-180 - before dinner: 83 >> n/c - 2h after dinner: occas. 220 >> 170-180, 220 - bedtime: n/c - nighttime: n/c Lowest sugar was 80 >> 83 >> 64; she has hypoglycemia awareness at 90.  Highest sugar was 200 >> 220 >> 220 postprandially  Glucometer:One Touch A CGM was not affordable.  - no CKD, last BUN/creatinine:  Lab Results  Component Value Date   BUN 8 04/07/2022   BUN 9 08/09/2021   CREATININE 0.70 04/07/2022   CREATININE 0.61 08/09/2021  Prev. On Cozaar 50 mg daily - off since 11/2021.  -+ HL; last set of lipids:  2021-08-29     LDL Chol Calc (NIH) 69   0-99  CHOL/HDL 2.9   2.0-4.0  Cholesterol 130   <200  HDLD 45   30-85  LDL Chol Calc (NIH) 69   0-99  NHDL 85   0-129  Triglyceride 78   0-199   No results found for: "CHOL", "HDL", "LDLCALC", "LDLDIRECT", "TRIG", "CHOLHDL" We stopped Crestor 5 mg  daily at last visit, due to the possibility of pregnancy.  I advised her to restart this in 03/2022, as she mentioned no desire for pregnancy at that time. She did not yet.  - + numbness and tingling in her feet.  Last foot exam 03/27/2022.  - Last eye exam 03/2022: No DR reportedly.  ROS: + see HPI  Past Medical History:  Diagnosis Date   Active labor 12/29/2011   Anemia    FeSO4 taken   Heart murmur    Hypercholesteremia    Hypertension    NSVD (normal spontaneous vaginal delivery) 12/29/2011   Scoliosis    Sickle cell trait (HCC)    Yeast infection    NOT FREQUENT   Past Surgical History:  Procedure Laterality Date   NO PAST SURGERIES     Social History   Socioeconomic History   Marital status: Single    Spouse name: Not on file   Number of children: 1   Years of education: 27   Highest education level: Not on file  Occupational History   Occupation: [employer]  Tobacco Use   Smoking status: Never   Smokeless tobacco: Never  Vaping Use   Vaping status: Never Used  Substance and Sexual Activity   Alcohol use: No   Drug use:  No   Sexual activity: Not Currently    Birth control/protection: None    Comment: currently pregnant  Other Topics Concern   Not on file  Social History Narrative   Not on file   Social Determinants of Health   Financial Resource Strain: Not on file  Food Insecurity: Low Risk  (12/16/2022)   Received from [facility]   Hunger Vital Sign    Worried About Running Out of Food in the Last Year: Never true    Ran Out of Food in the Last Year: Never true  Transportation Needs: No Transportation Needs (12/16/2022)   Received from [facility]    In the past 12 months, has lack of reliable transportation kept you from medical appointments, meetings, work or from getting things needed for daily living? : No  Physical Activity: Not on file  Stress: Not on file  Social Connections: Not on file  Intimate Partner  Violence: Not on file   Current Outpatient Medications on File Prior to Visit  Medication Sig Dispense Refill   amLODipine (NORVASC) 5 MG tablet 1 tablet Orally Once a day for 90 days     glucose blood test strip Check sugars 2x a day 200 each 3   ibuprofen (ADVIL) 800 MG tablet Take 1 tablet (800 mg total) by mouth every 8 (eight) hours as needed for moderate pain or cramping. 21 tablet 0   insulin degludec (TRESIBA FLEXTOUCH) 200 UNIT/ML FlexTouch Pen Inject 36-38 Units into the skin daily. 18 mL 1   Insulin Pen Needle 32G X 4 MM MISC Use 1x a day 100 each 3   loratadine (CLARITIN) 10 MG tablet Take 10 mg by mouth daily.     losartan (COZAAR) 50 MG tablet losartan 50 mg tablet  TAKE 1 TABLET BY MOUTH ONCE DAILY     ondansetron (ZOFRAN-ODT) 4 MG disintegrating tablet Take 1 tablet (4 mg total) by mouth every 8 (eight) hours as needed for nausea or vomiting. (Patient not taking: Reported on 10/02/2022) 20 tablet 0   OneTouch Delica Lancets 33G MISC Check sugars 2x a day 200 each 3   rosuvastatin (CRESTOR) 5 MG tablet rosuvastatin 5 mg tablet  TAKE 1 TABLET BY MOUTH ONCE DAILY IN THE EVENING 90 tablet 3   sitaGLIPtin (JANUVIA) 100 MG tablet Take 1 tablet (100 mg total) by mouth daily. 90 tablet 3   No current facility-administered medications on file prior to visit.   No Known Allergies Family History  Problem Relation Age of Onset   Diabetes Father    Diabetes Paternal Grandmother    Hypertension Paternal Aunt    Anesthesia problems Neg Hx    Hypotension Neg Hx    Malignant hyperthermia Neg Hx    Pseudochol deficiency Neg Hx    ROS: + see HPI  Past Medical History:  Diagnosis Date   Active labor 12/29/2011   Anemia    FeSO4 taken   Heart murmur    Hypercholesteremia    Hypertension    NSVD (normal spontaneous vaginal delivery) 12/29/2011   Scoliosis    Sickle cell trait (HCC)    Yeast infection    NOT FREQUENT   Past Surgical History:  Procedure Laterality Date   NO  PAST SURGERIES     Social History   Socioeconomic History   Marital status: Single    Spouse name: Not on file   Number of children: 1   Years of education: 13   Highest education  level: Not on file  Occupational History   Occupation: [employer]  Tobacco Use   Smoking status: Never   Smokeless tobacco: Never  Vaping Use   Vaping status: Never Used  Substance and Sexual Activity   Alcohol use: No   Drug use: No   Sexual activity: Not Currently    Birth control/protection: None    Comment: currently pregnant  Other Topics Concern   Not on file  Social History Narrative   Not on file   Social Determinants of Health   Financial Resource Strain: Not on file  Food Insecurity: Low Risk  (12/16/2022)   Received from [facility]   Hunger Vital Sign    Worried About Running Out of Food in the Last Year: Never true    Ran Out of Food in the Last Year: Never true  Transportation Needs: No Transportation Needs (12/16/2022)   Received from [facility]    In the past 12 months, has lack of reliable transportation kept you from medical appointments, meetings, work or from getting things needed for daily living? : No  Physical Activity: Not on file  Stress: Not on file  Social Connections: Not on file  Intimate Partner Violence: Not on file   Current Outpatient Medications on File Prior to Visit  Medication Sig Dispense Refill   amLODipine (NORVASC) 5 MG tablet 1 tablet Orally Once a day for 90 days     glucose blood test strip Check sugars 2x a day 200 each 3   ibuprofen (ADVIL) 800 MG tablet Take 1 tablet (800 mg total) by mouth every 8 (eight) hours as needed for moderate pain or cramping. 21 tablet 0   insulin degludec (TRESIBA FLEXTOUCH) 200 UNIT/ML FlexTouch Pen Inject 36-38 Units into the skin daily. 18 mL 1   Insulin Pen Needle 32G X 4 MM MISC Use 1x a day 100 each 3   loratadine (CLARITIN) 10 MG tablet Take 10 mg by mouth daily.     losartan (COZAAR)  50 MG tablet losartan 50 mg tablet  TAKE 1 TABLET BY MOUTH ONCE DAILY     ondansetron (ZOFRAN-ODT) 4 MG disintegrating tablet Take 1 tablet (4 mg total) by mouth every 8 (eight) hours as needed for nausea or vomiting. (Patient not taking: Reported on 10/02/2022) 20 tablet 0   OneTouch Delica Lancets 33G MISC Check sugars 2x a day 200 each 3   rosuvastatin (CRESTOR) 5 MG tablet rosuvastatin 5 mg tablet  TAKE 1 TABLET BY MOUTH ONCE DAILY IN THE EVENING 90 tablet 3   sitaGLIPtin (JANUVIA) 100 MG tablet Take 1 tablet (100 mg total) by mouth daily. 90 tablet 3   No current facility-administered medications on file prior to visit.   No Known Allergies Family History  Problem Relation Age of Onset   Diabetes Father    Diabetes Paternal Grandmother    Hypertension Paternal Aunt    Anesthesia problems Neg Hx    Hypotension Neg Hx    Malignant hyperthermia Neg Hx    Pseudochol deficiency Neg Hx    PE: There were no vitals taken for this visit. Wt Readings from Last 3 Encounters:  10/02/22 163 lb 12.8 oz (74.3 kg)  04/07/22 160 lb 11.5 oz (72.9 kg)  03/27/22 160 lb 12.8 oz (72.9 kg)   Constitutional: overweight, in NAD Eyes:  EOMI, no exophthalmos ENT: no neck masses, no cervical lymphadenopathy Cardiovascular: RRR, No MRG Respiratory: CTA B Musculoskeletal: no deformities Skin:no rashes Neurological: no  tremor with outstretched hands  ASSESSMENT: 1. DM2, insulin-dependent, uncontrolled, without long-term complications, but with hyperglycemia  2. HL  PLAN:  1. Patient with longstanding, uncontrolled, type 2 diabetes on long acting insulin only at last visit, to which we added a DPP 4 inhibitor at that time.  Sugars were at goal in the morning, usually in the 90s, but they were increasing above 200 after meals.  We backed off her Tresiba dose and recommended to add a DPP 4 inhibitor as she did not want to start any medication with weight loss as a side effect.  HbA1c at last visit was  higher, at 7.6% after relaxing her diet. - I suggested to:  Patient Instructions  Please continue: - Tresiba 36 units in a.m - Januvia 100 mg before b'fast  Also continue: - Crestor 5 mg daily  Please return in 4-6 months with your sugar log.   - we checked her HbA1c: 7%  - advised to check sugars at different times of the day - 1x a day, rotating check times - advised for yearly eye exams >> she is UTD - return to clinic in 4-6  months  2.  Hyperlipidemia -Reviewed latest lipid panel from 08/2021: Fractions at goal: No results found for: "CHOL", "HDL", "LDLCALC", "LDLDIRECT", "TRIG", "CHOLHDL" -I in the past, she was contemplating a pregnancy and we had her off statins, but at last visit, she agreed to start Crestor 5 mg daily since no pregnancy was planned  Carlus Pavlov, MD PhD [facility]

## 2023-02-25 LAB — COMPREHENSIVE METABOLIC PANEL
ALT: 10 U/L (ref 0–35)
AST: 12 U/L (ref 0–37)
Albumin: 4.7 g/dL (ref 3.5–5.2)
Alkaline Phosphatase: 70 U/L (ref 39–117)
BUN: 11 mg/dL (ref 6–23)
CO2: 27 meq/L (ref 19–32)
Calcium: 9.2 mg/dL (ref 8.4–10.5)
Chloride: 102 meq/L (ref 96–112)
Creatinine, Ser: 0.69 mg/dL (ref 0.40–1.20)
GFR: 113.25 mL/min (ref >60.00)
Glucose, Bld: 106 mg/dL — ABNORMAL HIGH (ref 70–99)
Potassium: 3.8 meq/L (ref 3.5–5.1)
Sodium: 138 meq/L (ref 135–145)
Total Bilirubin: 0.5 mg/dL (ref 0.2–1.2)
Total Protein: 7.5 g/dL (ref 6.0–8.3)

## 2023-02-25 LAB — LIPID PANEL
Cholesterol: 174 mg/dL (ref 0–200)
HDL: 45.7 mg/dL (ref >39.00)
LDL Cholesterol: 117 mg/dL — ABNORMAL HIGH (ref 0–99)
NonHDL: 128.52
Total CHOL/HDL Ratio: 4
Triglycerides: 59 mg/dL (ref 0.0–149.0)
VLDL: 11.8 mg/dL (ref 0.0–40.0)

## 2023-02-25 LAB — MICROALBUMIN / CREATININE URINE RATIO
Creatinine,U: 209 mg/dL
Microalb Creat Ratio: 1 mg/g (ref 0.0–30.0)
Microalb, Ur: 2.1 mg/dL — ABNORMAL HIGH (ref 0.0–1.9)

## 2023-02-25 LAB — HCG, QUANTITATIVE, PREGNANCY: HCG, Total, QN: <5 m[IU]/mL

## 2023-02-26 DIAGNOSIS — R739 Hyperglycemia, unspecified: Secondary | ICD-10-CM | POA: Diagnosis not present

## 2023-02-26 DIAGNOSIS — R5383 Other fatigue: Secondary | ICD-10-CM | POA: Diagnosis not present

## 2023-02-26 DIAGNOSIS — E1165 Type 2 diabetes mellitus with hyperglycemia: Secondary | ICD-10-CM | POA: Diagnosis not present

## 2023-02-26 DIAGNOSIS — Z0000 Encounter for general adult medical examination without abnormal findings: Secondary | ICD-10-CM | POA: Diagnosis not present

## 2023-04-03 DIAGNOSIS — Z3200 Encounter for pregnancy test, result unknown: Secondary | ICD-10-CM | POA: Diagnosis not present

## 2023-04-03 DIAGNOSIS — N926 Irregular menstruation, unspecified: Secondary | ICD-10-CM | POA: Diagnosis not present

## 2023-04-06 DIAGNOSIS — O26859 Spotting complicating pregnancy, unspecified trimester: Secondary | ICD-10-CM | POA: Diagnosis not present

## 2023-05-08 DIAGNOSIS — N760 Acute vaginitis: Secondary | ICD-10-CM | POA: Diagnosis not present

## 2023-05-08 DIAGNOSIS — O039 Complete or unspecified spontaneous abortion without complication: Secondary | ICD-10-CM | POA: Diagnosis not present

## 2023-05-29 DIAGNOSIS — Z01419 Encounter for gynecological examination (general) (routine) without abnormal findings: Secondary | ICD-10-CM | POA: Diagnosis not present

## 2023-05-29 DIAGNOSIS — Z124 Encounter for screening for malignant neoplasm of cervix: Secondary | ICD-10-CM | POA: Diagnosis not present

## 2023-05-29 DIAGNOSIS — Z1331 Encounter for screening for depression: Secondary | ICD-10-CM | POA: Diagnosis not present

## 2023-05-29 DIAGNOSIS — R350 Frequency of micturition: Secondary | ICD-10-CM | POA: Diagnosis not present

## 2023-06-16 DIAGNOSIS — Z114 Encounter for screening for human immunodeficiency virus [HIV]: Secondary | ICD-10-CM | POA: Diagnosis not present

## 2023-06-16 DIAGNOSIS — Z118 Encounter for screening for other infectious and parasitic diseases: Secondary | ICD-10-CM | POA: Diagnosis not present

## 2023-06-16 DIAGNOSIS — R1031 Right lower quadrant pain: Secondary | ICD-10-CM | POA: Diagnosis not present

## 2023-06-16 DIAGNOSIS — Z1159 Encounter for screening for other viral diseases: Secondary | ICD-10-CM | POA: Diagnosis not present

## 2023-06-16 DIAGNOSIS — Z113 Encounter for screening for infections with a predominantly sexual mode of transmission: Secondary | ICD-10-CM | POA: Diagnosis not present

## 2023-06-16 DIAGNOSIS — K5900 Constipation, unspecified: Secondary | ICD-10-CM | POA: Diagnosis not present

## 2023-06-22 DIAGNOSIS — R1031 Right lower quadrant pain: Secondary | ICD-10-CM | POA: Diagnosis not present

## 2023-06-25 ENCOUNTER — Telehealth: Payer: Self-pay

## 2023-06-25 ENCOUNTER — Encounter: Payer: Self-pay | Admitting: Internal Medicine

## 2023-06-25 ENCOUNTER — Ambulatory Visit: Payer: BC Managed Care – PPO | Admitting: Internal Medicine

## 2023-06-25 VITALS — BP 120/70 | HR 79 | Ht 59.0 in | Wt 163.0 lb

## 2023-06-25 DIAGNOSIS — E785 Hyperlipidemia, unspecified: Secondary | ICD-10-CM | POA: Diagnosis not present

## 2023-06-25 DIAGNOSIS — Z794 Long term (current) use of insulin: Secondary | ICD-10-CM

## 2023-06-25 DIAGNOSIS — E1165 Type 2 diabetes mellitus with hyperglycemia: Secondary | ICD-10-CM | POA: Diagnosis not present

## 2023-06-25 MED ORDER — SITAGLIPTIN PHOSPHATE 100 MG PO TABS: 100.0000 mg | ORAL_TABLET | Freq: Every day | ORAL | 3 refills | Status: DC

## 2023-06-25 MED ORDER — GLIPIZIDE 5 MG PO TABS: 5.0000 mg | ORAL_TABLET | Freq: Every day | ORAL | 3 refills | Status: DC

## 2023-06-25 MED ORDER — DEXCOM G7 SENSOR MISC: 3.0000 | 4 refills | Status: DC

## 2023-06-25 NOTE — Telephone Encounter (Signed)
 Requested Prescriptions   Signed Prescriptions Disp Refills   Continuous Glucose Sensor (DEXCOM G7 SENSOR) MISC 9 each 4    Sig: 3 each by Does not apply route every 30 (thirty) days. Apply 1 sensor every 10 days    Authorizing Provider: Carlus Pavlov    Ordering User: Valorie Roosevelt S   Last rx from 02/2023 was sent to the wrong pharmacy New script has been sent to [facility] [address]

## 2023-06-25 NOTE — Patient Instructions (Addendum)
 Please continue: - Tresiba 28 units in a.m  Try to restart: - Januvia 100 mg before b'fast  You can use: - Glipizide 5 mg before dinner (especially with more carbs)  Also continue: - Crestor 5 mg daily  Please return in 4 months with your sugar log.

## 2023-06-25 NOTE — Progress Notes (Signed)
 Patient ID: Allison Mathews, female   DOB: 05-05-1990, 33 y.o.   MRN: [id number]  HPI: Allison Mathews is a 33 y.o.-year-old female, returning for follow-up for DM2, dx in 2019, insulin-dependent since 2021, uncontrolled, without long-term complications. Pt. previously saw Dr. Everardo All, but last visit with me 4 months ago.   Interim history: She has increased urination, occasional blurry vision, fatigue. No nausea, chest pain. She has lower abd. cramping - she saw OB/GYN-was advised that this can be due to constipation.  She was recommended MiraLAX.  Reviewed HbA1c: Lab Results  Component Value Date   HGBA1C 7.5 (A) 02/24/2023   HGBA1C 7.6 10/02/2022   HGBA1C 6.3 (A) 03/27/2022   HGBA1C 7.0 (A) 11/25/2021   HGBA1C 7.7 (A) 08/09/2021   HGBA1C 9.3 (A) 03/20/2021  02/14/2021: HbA1c 10.7%  Pt is on a regimen of: - Tresiba 42 >> 36  >> 28 units daily did not get, did not restart  -recommended 02/2023 - did not get She prev. stopped metformin (nausea/hair loss), Januvia, and Ozempic due to weight loss, nausea, hair loss.  Pt checks her sugars 0-1x a day and they are: - am: 93-100 >> 80-90 >> 90-120 >> 130-160 - 2h after b'fast: n/c - before lunch: 110 >> 120 >> n/c - 2h after lunch: up to 170 >> 174-180 >> 130-140 >> n/c - before dinner: 83 >> n/c - 2h after dinner: 170-180, 220 >> 170-180, 443 >> 150-190, 200 - bedtime: n/c - nighttime: n/c Lowest sugar was 64 >> 90 >> 110; she has hypoglycemia awareness at 90.  Highest sugar was 200 >> 220 >> 220 postprandially >> 443 (pasta).>> 204.  Glucometer:One Touch A CGM was not affordable.  - no CKD, last BUN/creatinine:  Lab Results  Component Value Date   BUN 11 02/24/2023   BUN 8 04/07/2022   CREATININE 0.69 02/24/2023   CREATININE 0.70 04/07/2022  Prev. On Cozaar 50 mg daily - off since 11/2021.  -+ HL; last set of lipids: Lab Results  Component Value Date   CHOL 174 02/24/2023   HDL 45.70 02/24/2023   LDLCALC 117 (H)  02/24/2023   TRIG 59.0 02/24/2023   CHOLHDL 4 02/24/2023    2021-08-29     LDL Chol Calc (NIH) 69   0-99  CHOL/HDL 2.9   2.0-4.0  Cholesterol 130   <200  HDLD 45   30-85  LDL Chol Calc (NIH) 69   0-99  NHDL 85   0-129  Triglyceride 78   0-199   We stopped Crestor 5 mg daily at last visit, due to the possibility of pregnancy.  I advised her to restart this in 03/2022, as she mentioned no desire for pregnancy at that time. She started this.  - + numbness and tingling in her feet.  Last foot exam 02/24/2023.  - Last eye exam 2024: No DR reportedly.  ROS: + see HPI  Past Medical History:  Diagnosis Date   Active labor 12/29/2011   Anemia    FeSO4 taken   Heart murmur    Hypercholesteremia    Hypertension    NSVD (normal spontaneous vaginal delivery) 12/29/2011   Scoliosis    Sickle cell trait (HCC)    Yeast infection    NOT FREQUENT   Past Surgical History:  Procedure Laterality Date   NO PAST SURGERIES     Social History   Socioeconomic History   Marital status: Single    Spouse name: Not on file   Number of  children: 1   Years of education: 13   Highest education level: Not on file  Occupational History   Occupation: [employer]  Tobacco Use   Smoking status: Never   Smokeless tobacco: Never  Vaping Use   Vaping status: Never Used  Substance and Sexual Activity   Alcohol use: No   Drug use: No   Sexual activity: Not Currently    Birth control/protection: None    Comment: currently pregnant  Other Topics Concern   Not on file  Social History Narrative   Not on file   Social Drivers of Health   Financial Resource Strain: Not on file  Food Insecurity: Low Risk  (12/16/2022)   Received from [facility]   Hunger Vital Sign    Worried About Running Out of Food in the Last Year: Never true    Ran Out of Food in the Last Year: Never true  Transportation Needs: No Transportation Needs (12/16/2022)   Received from [facility]    In  the past 12 months, has lack of reliable transportation kept you from medical appointments, meetings, work or from getting things needed for daily living? : No  Physical Activity: Not on file  Stress: Not on file  Social Connections: Not on file  Intimate Partner Violence: Not on file   Current Outpatient Medications on File Prior to Visit  Medication Sig Dispense Refill   amLODipine (NORVASC) 5 MG tablet 1 tablet Orally Once a day for 90 days     Continuous Glucose Sensor (DEXCOM G7 SENSOR) MISC 3 each by Does not apply route every 30 (thirty) days. Apply 1 sensor every 10 days 9 each 4   glipiZIDE (GLUCOTROL) 5 MG tablet Take 1 tablet (5 mg total) by mouth daily before supper. 90 tablet 3   glucose blood test strip Check sugars 2x a day - One Touch 200 each 3   ibuprofen (ADVIL) 800 MG tablet Take 1 tablet (800 mg total) by mouth every 8 (eight) hours as needed for moderate pain or cramping. 21 tablet 0   insulin degludec (TRESIBA FLEXTOUCH) 200 UNIT/ML FlexTouch Pen Inject 36-38 Units into the skin daily. 18 mL 1   Insulin Pen Needle 32G X 4 MM MISC Use 1x a day 100 each 3   loratadine (CLARITIN) 10 MG tablet Take 10 mg by mouth daily.     losartan (COZAAR) 50 MG tablet losartan 50 mg tablet  TAKE 1 TABLET BY MOUTH ONCE DAILY     ondansetron (ZOFRAN-ODT) 4 MG disintegrating tablet Take 1 tablet (4 mg total) by mouth every 8 (eight) hours as needed for nausea or vomiting. 20 tablet 0   OneTouch Delica Lancets 33G MISC Check sugars 2x a day 200 each 3   rosuvastatin (CRESTOR) 5 MG tablet rosuvastatin 5 mg tablet  TAKE 1 TABLET BY MOUTH ONCE DAILY IN THE EVENING 90 tablet 3   sitaGLIPtin (JANUVIA) 100 MG tablet Take 1 tablet (100 mg total) by mouth daily. 90 tablet 3   No current facility-administered medications on file prior to visit.   No Known Allergies Family History  Problem Relation Age of Onset   Diabetes Father    Diabetes Paternal Grandmother    Hypertension Paternal Aunt     Anesthesia problems Neg Hx    Hypotension Neg Hx    Malignant hyperthermia Neg Hx    Pseudochol deficiency Neg Hx    ROS: + see HPI  Past Medical History:  Diagnosis Date  Active labor 12/29/2011   Anemia    FeSO4 taken   Heart murmur    Hypercholesteremia    Hypertension    NSVD (normal spontaneous vaginal delivery) 12/29/2011   Scoliosis    Sickle cell trait (HCC)    Yeast infection    NOT FREQUENT   Past Surgical History:  Procedure Laterality Date   NO PAST SURGERIES     Social History   Socioeconomic History   Marital status: Single    Spouse name: Not on file   Number of children: 1   Years of education: 10   Highest education level: Not on file  Occupational History   Occupation: [employer]  Tobacco Use   Smoking status: Never   Smokeless tobacco: Never  Vaping Use   Vaping status: Never Used  Substance and Sexual Activity   Alcohol use: No   Drug use: No   Sexual activity: Not Currently    Birth control/protection: None    Comment: currently pregnant  Other Topics Concern   Not on file  Social History Narrative   Not on file   Social Drivers of Health   Financial Resource Strain: Not on file  Food Insecurity: Low Risk  (12/16/2022)   Received from [facility]   Hunger Vital Sign    Worried About Running Out of Food in the Last Year: Never true    Ran Out of Food in the Last Year: Never true  Transportation Needs: No Transportation Needs (12/16/2022)   Received from [facility]    In the past 12 months, has lack of reliable transportation kept you from medical appointments, meetings, work or from getting things needed for daily living? : No  Physical Activity: Not on file  Stress: Not on file  Social Connections: Not on file  Intimate Partner Violence: Not on file   Current Outpatient Medications on File Prior to Visit  Medication Sig Dispense Refill   amLODipine (NORVASC) 5 MG tablet 1 tablet Orally Once a day for 90  days     Continuous Glucose Sensor (DEXCOM G7 SENSOR) MISC 3 each by Does not apply route every 30 (thirty) days. Apply 1 sensor every 10 days 9 each 4   glipiZIDE (GLUCOTROL) 5 MG tablet Take 1 tablet (5 mg total) by mouth daily before supper. 90 tablet 3   glucose blood test strip Check sugars 2x a day - One Touch 200 each 3   ibuprofen (ADVIL) 800 MG tablet Take 1 tablet (800 mg total) by mouth every 8 (eight) hours as needed for moderate pain or cramping. 21 tablet 0   insulin degludec (TRESIBA FLEXTOUCH) 200 UNIT/ML FlexTouch Pen Inject 36-38 Units into the skin daily. 18 mL 1   Insulin Pen Needle 32G X 4 MM MISC Use 1x a day 100 each 3   loratadine (CLARITIN) 10 MG tablet Take 10 mg by mouth daily.     losartan (COZAAR) 50 MG tablet losartan 50 mg tablet  TAKE 1 TABLET BY MOUTH ONCE DAILY     ondansetron (ZOFRAN-ODT) 4 MG disintegrating tablet Take 1 tablet (4 mg total) by mouth every 8 (eight) hours as needed for nausea or vomiting. 20 tablet 0   OneTouch Delica Lancets 33G MISC Check sugars 2x a day 200 each 3   rosuvastatin (CRESTOR) 5 MG tablet rosuvastatin 5 mg tablet  TAKE 1 TABLET BY MOUTH ONCE DAILY IN THE EVENING 90 tablet 3   sitaGLIPtin (JANUVIA) 100 MG tablet Take  1 tablet (100 mg total) by mouth daily. 90 tablet 3   No current facility-administered medications on file prior to visit.   No Known Allergies Family History  Problem Relation Age of Onset   Diabetes Father    Diabetes Paternal Grandmother    Hypertension Paternal Aunt    Anesthesia problems Neg Hx    Hypotension Neg Hx    Malignant hyperthermia Neg Hx    Pseudochol deficiency Neg Hx    PE: BP 120/70   Pulse 79   Ht 4\' 11"  (1.499 m)   Wt 163 lb (73.9 kg)   SpO2 99%   BMI 32.92 kg/m  Wt Readings from Last 3 Encounters:  06/25/23 163 lb (73.9 kg)  02/24/23 164 lb (74.4 kg)  10/02/22 163 lb 12.8 oz (74.3 kg)   Constitutional: overweight, in NAD Eyes:  EOMI, no exophthalmos ENT: no neck masses, no  cervical lymphadenopathy Cardiovascular:  RRR, No MRG Respiratory: CTA B Musculoskeletal: no deformities Skin:no rashes Neurological: no tremor with outstretched hands  ASSESSMENT: 1. DM2, insulin-dependent, uncontrolled, without long-term complications, but with hyperglycemia  2. HL  PLAN:  1. Patient with longstanding, uncontrolled, on long-acting insulin along with DPP 4 inhibitor and also as needed sulfonylurea before a larger dinner.  At last visit, sugars were slightly better with an HbA1c of 7.5%, decreased from 7.6%.  She still had significant hyperglycemic spikes in the 400s after eating pasta.  She had increased urination and feeling tired around that time.  We discussed about continuing metformin and Januvia but I recommended to take a low-dose glipizide before meals with more carbs.  We also checked a pregnancy test but this was negative.  I sent a prescription for the strips and lancets to her pharmacy. -At today's visit, her sugars appear to be higher in the morning and they are at or slightly above target at night.  She did not see hyperglycemic spikes in the 400s anymore, but occasionally she may see blood sugars in the 200s after dinner.  She is not checking during the day.  However, we sent a prescription to the pharmacy for a CGM and she will start this. -Upon questioning, she is only taking [place] at a slightly lower dose than before but she is not on Januvia as reported at last visit as she mentions that she was not able to start.  Also, she did not get the glipizide from the pharmacy. -At today's visit I gave her a written prescription for Januvia and I advised her to try to take this every morning before breakfast.  Regarding glipizide, I printed a prescription for her to take as needed before a meal with more carbs.  Will continue Tresiba at the same dose for now - I suggested to:  Patient Instructions  Please continue: - Tresiba 28 units in a.m  Try to restart: -  Januvia 100 mg before b'fast  You can use: - Glipizide 5 mg before dinner (especially with more carbs)  Also continue: - Crestor 5 mg daily  Please return in 4 months with your sugar log.   - we checked her HbA1c: 6.5% (improved) - advised to check sugars at different times of the day - 1x a day, rotating check times - advised for yearly eye exams >> she is UTD - return to clinic in 4-6 months  2.  Hyperlipidemia -Latest lipid panel was reviewed from 02/2023: LDL above our target of less than 100, otherwise fractions at goal: Lab Results  Component  Value Date   CHOL 174 02/24/2023   HDL 45.70 02/24/2023   LDLCALC 117 (H) 02/24/2023   TRIG 59.0 02/24/2023   CHOLHDL 4 02/24/2023  -In the past, she was contemplating a pregnancy and we had her off statins but currently on 5 mg daily as no pregnancy is planned.  Carlus Pavlov, MD PhD [facility]

## 2023-06-26 ENCOUNTER — Emergency Department (HOSPITAL_COMMUNITY)
Admission: EM | Admit: 2023-06-26 | Discharge: 2023-06-26 | Disposition: A | Discharge status: Home / Self Care | Attending: Emergency Medicine | Admitting: Emergency Medicine

## 2023-06-26 ENCOUNTER — Encounter (HOSPITAL_COMMUNITY): Payer: Self-pay

## 2023-06-26 ENCOUNTER — Other Ambulatory Visit: Payer: Self-pay

## 2023-06-26 DIAGNOSIS — R030 Elevated blood-pressure reading, without diagnosis of hypertension: Secondary | ICD-10-CM

## 2023-06-26 DIAGNOSIS — Y92410 Unspecified street and highway as the place of occurrence of the external cause: Secondary | ICD-10-CM | POA: Diagnosis not present

## 2023-06-26 DIAGNOSIS — M542 Cervicalgia: Secondary | ICD-10-CM | POA: Insufficient documentation

## 2023-06-26 DIAGNOSIS — I10 Essential (primary) hypertension: Secondary | ICD-10-CM | POA: Insufficient documentation

## 2023-06-26 DIAGNOSIS — Z79899 Other long term (current) drug therapy: Secondary | ICD-10-CM | POA: Insufficient documentation

## 2023-06-26 DIAGNOSIS — R519 Headache, unspecified: Secondary | ICD-10-CM | POA: Diagnosis not present

## 2023-06-26 DIAGNOSIS — M549 Dorsalgia, unspecified: Secondary | ICD-10-CM | POA: Insufficient documentation

## 2023-06-26 LAB — CBG MONITORING, ED: Glucose-Capillary: 124 mg/dL — ABNORMAL HIGH (ref 70–99)

## 2023-06-26 MED ORDER — METHOCARBAMOL 500 MG PO TABS: 500.0000 mg | ORAL_TABLET | Freq: Two times a day (BID) | ORAL | 0 refills | Status: DC
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
## 2023-07-23 ENCOUNTER — Other Ambulatory Visit: Payer: Self-pay | Admitting: Internal Medicine

## 2023-08-04 DIAGNOSIS — G4719 Other hypersomnia: Secondary | ICD-10-CM | POA: Diagnosis not present

## 2023-08-04 DIAGNOSIS — R5383 Other fatigue: Secondary | ICD-10-CM | POA: Diagnosis not present

## 2023-08-20 DIAGNOSIS — R053 Chronic cough: Secondary | ICD-10-CM | POA: Diagnosis not present

## 2023-08-20 DIAGNOSIS — R0981 Nasal congestion: Secondary | ICD-10-CM | POA: Diagnosis not present

## 2023-08-20 DIAGNOSIS — J069 Acute upper respiratory infection, unspecified: Secondary | ICD-10-CM | POA: Diagnosis not present

## 2023-09-06 DIAGNOSIS — N898 Other specified noninflammatory disorders of vagina: Secondary | ICD-10-CM | POA: Diagnosis not present

## 2023-09-06 DIAGNOSIS — N760 Acute vaginitis: Secondary | ICD-10-CM | POA: Diagnosis not present

## 2023-09-06 DIAGNOSIS — Z113 Encounter for screening for infections with a predominantly sexual mode of transmission: Secondary | ICD-10-CM | POA: Diagnosis not present

## 2023-09-06 DIAGNOSIS — Z131 Encounter for screening for diabetes mellitus: Secondary | ICD-10-CM | POA: Diagnosis not present

## 2023-09-06 DIAGNOSIS — E1165 Type 2 diabetes mellitus with hyperglycemia: Secondary | ICD-10-CM | POA: Diagnosis not present

## 2023-10-02 DIAGNOSIS — Z3200 Encounter for pregnancy test, result unknown: Secondary | ICD-10-CM | POA: Diagnosis not present

## 2023-10-02 DIAGNOSIS — O09299 Supervision of pregnancy with other poor reproductive or obstetric history, unspecified trimester: Secondary | ICD-10-CM | POA: Diagnosis not present

## 2023-10-06 DIAGNOSIS — O09299 Supervision of pregnancy with other poor reproductive or obstetric history, unspecified trimester: Secondary | ICD-10-CM | POA: Diagnosis not present

## 2023-10-13 DIAGNOSIS — O09299 Supervision of pregnancy with other poor reproductive or obstetric history, unspecified trimester: Secondary | ICD-10-CM | POA: Diagnosis not present

## 2023-10-13 DIAGNOSIS — Z3A00 Weeks of gestation of pregnancy not specified: Secondary | ICD-10-CM | POA: Diagnosis not present

## 2023-10-15 DIAGNOSIS — O09299 Supervision of pregnancy with other poor reproductive or obstetric history, unspecified trimester: Secondary | ICD-10-CM | POA: Diagnosis not present

## 2023-10-15 DIAGNOSIS — Z3A00 Weeks of gestation of pregnancy not specified: Secondary | ICD-10-CM | POA: Diagnosis not present

## 2023-10-21 ENCOUNTER — Telehealth: Payer: Self-pay | Admitting: Internal Medicine

## 2023-10-21 DIAGNOSIS — Z3201 Encounter for pregnancy test, result positive: Secondary | ICD-10-CM | POA: Diagnosis not present

## 2023-10-21 NOTE — Telephone Encounter (Signed)
 Patient called and stated she recently found out that she is pregnant.She wanted to make Dr.Gherghe aware in the instance that the patient's other's providers need to be in communication with each other regarding change in condition and or medication changes.Patient also stated that she may be having to reschedule her upcoming appointment but will try to make it.

## 2023-10-23 NOTE — Telephone Encounter (Signed)
 I sent a mychart message along with calling her and leaving a detailed message.

## 2023-10-26 ENCOUNTER — Encounter: Payer: Self-pay | Admitting: Internal Medicine

## 2023-10-26 ENCOUNTER — Telehealth: Payer: Self-pay

## 2023-10-26 ENCOUNTER — Other Ambulatory Visit: Payer: Self-pay | Admitting: Internal Medicine

## 2023-10-26 ENCOUNTER — Other Ambulatory Visit (HOSPITAL_COMMUNITY): Payer: Self-pay

## 2023-10-26 ENCOUNTER — Ambulatory Visit: Admitting: Internal Medicine

## 2023-10-26 VITALS — BP 130/80 | HR 73 | Ht 59.0 in | Wt 164.0 lb

## 2023-10-26 DIAGNOSIS — Z3A01 Less than 8 weeks gestation of pregnancy: Secondary | ICD-10-CM | POA: Diagnosis not present

## 2023-10-26 DIAGNOSIS — E785 Hyperlipidemia, unspecified: Secondary | ICD-10-CM

## 2023-10-26 DIAGNOSIS — O24111 Pre-existing diabetes mellitus, type 2, in pregnancy, first trimester: Secondary | ICD-10-CM | POA: Diagnosis not present

## 2023-10-26 DIAGNOSIS — Z794 Long term (current) use of insulin: Secondary | ICD-10-CM | POA: Diagnosis not present

## 2023-10-26 LAB — POCT GLYCOSYLATED HEMOGLOBIN (HGB A1C): Hemoglobin A1C: 9.3 % — AB (ref 4.0–5.6)

## 2023-10-26 MED ORDER — TRESIBA FLEXTOUCH 200 UNIT/ML ~~LOC~~ SOPN: 46.0000 [IU] | PEN_INJECTOR | Freq: Every day | SUBCUTANEOUS | 3 refills | Status: DC

## 2023-10-26 MED ORDER — INSULIN PEN NEEDLE 32G X 4 MM MISC: 3 refills | Status: AC

## 2023-10-26 MED ORDER — FREESTYLE LIBRE 3 PLUS SENSOR MISC: 1.0000 | 3 refills | Status: AC

## 2023-10-26 MED ORDER — NOVOLOG FLEXPEN 100 UNIT/ML ~~LOC~~ SOPN: 10.0000 [IU] | PEN_INJECTOR | Freq: Three times a day (TID) | SUBCUTANEOUS | 3 refills | Status: DC

## 2023-10-26 NOTE — Patient Instructions (Addendum)
 Please continue: - Tresiba  46 units in a.m  Please add: - NovoLog  10-20 units 3x a day 15 min before meals   Blood sugar goals in pregnancy: Before meals <95, 1h after a meal <140 2h after a meal <120   HbA1c goal for pregnancy: <6%  Please return in 1 months with your sugar log.

## 2023-10-26 NOTE — Progress Notes (Signed)
 Patient ID: Allison Mathews, female   DOB: Jul 01, 1990, 33 y.o.   MRN: [id number]  HPI: Allison Mathews is a 33 y.o.-year-old female, returning for follow-up for DM2, dx in 2019, insulin -dependent since 2021, uncontrolled, without long-term complications. Pt. previously saw Dr. Kassie, but last visit with me 4 months ago.   Interim history: She contacted me several days ago about a new pregnancy.  I recommended to stop her Crestor  and Januvia . She has increased urination, no blurry vision. She has nausea, fatigue, foot swelling. She is pregnant, [redacted] weeks along. EDD 06/17/2024.  Previous have been very high.  Reviewed HbA1c: 06/25/2023: HbA1c 6.5% Lab Results  Component Value Date   HGBA1C 7.5 (A) 02/24/2023   HGBA1C 7.6 10/02/2022   HGBA1C 6.3 (A) 03/27/2022   HGBA1C 7.0 (A) 11/25/2021   HGBA1C 7.7 (A) 08/09/2021   HGBA1C 9.3 (A) 03/20/2021  02/14/2021: HbA1c 10.7%  Pt is on a regimen of: - Tresiba  42 >> 36  >> 28 >> she increased to 46 units daily - Januvia  100 mg daily -restarted 06/2023  >> stopped  - Glipizide  5 mg before a larger dinner -restarted 06/2023 >> stopped  She prev. stopped metformin  (nausea/hair loss), Januvia , and Ozempic due to weight loss, nausea, hair loss.  Pt checks her sugars 4x a day and they are: - am: 93-100 >> 80-90 >> 90-120 >> 130-160 >> 239-279 - 2h after b'fast: n/c - before lunch: 110 >> 120 >> n/c >> 311, 314 - 2h after lunch: up to 170 >> 174-180 >> 130-140 >> n/c  - before dinner: 83 >> n/c >> 402 x1 - 2h after dinner: 170-180, 220 >> 170-180, 443 >> 150-190, 200 >> 294 - bedtime: n/c - nighttime: n/c Lowest sugar was 64 >> 90 >> 110; she has hypoglycemia awareness at 90.  Highest sugar was  443 (pasta).>> 204>> 402.  Glucometer:One Touch A CGM was not affordable.  - no CKD, last BUN/creatinine:  Lab Results  Component Value Date   BUN 11 02/24/2023   BUN 8 04/07/2022   CREATININE 0.69 02/24/2023   CREATININE 0.70 04/07/2022  No  results found for: MICRALBCREAT Prev. On Cozaar 50 mg daily - off since 11/2021.  -+ HL; last set of lipids: Lab Results  Component Value Date   CHOL 174 02/24/2023   HDL 45.70 02/24/2023   LDLCALC 117 (H) 02/24/2023   TRIG 59.0 02/24/2023   CHOLHDL 4 02/24/2023  We stopped Crestor  5 mg daily previously due to the possibility of pregnancy.  I advised her to restart this in 03/2022, as she mentioned no desire for pregnancy at that time.  She was on it at last visit but we stopped it for her current pregnancy.  - + numbness and tingling in her feet.  Last foot exam 02/24/2023.  - Last eye exam 2024: No DR reportedly.  ROS: + see HPI  Past Medical History:  Diagnosis Date   Active labor 12/29/2011   Anemia    FeSO4 taken   Heart murmur    Hypercholesteremia    Hypertension    NSVD (normal spontaneous vaginal delivery) 12/29/2011   Scoliosis    Sickle cell trait (HCC)    Yeast infection    NOT FREQUENT   Past Surgical History:  Procedure Laterality Date   NO PAST SURGERIES     Social History   Socioeconomic History   Marital status: Single    Spouse name: Not on file   Number of children: 1  Years of education: 20   Highest education level: Not on file  Occupational History   Occupation: [employer]  Tobacco Use   Smoking status: Never   Smokeless tobacco: Never  Vaping Use   Vaping status: Never Used  Substance and Sexual Activity   Alcohol use: No   Drug use: No   Sexual activity: Not Currently    Birth control/protection: None    Comment: currently pregnant  Other Topics Concern   Not on file  Social History Narrative   Not on file   Social Drivers of Health   Financial Resource Strain: Not on file  Food Insecurity: Low Risk  (12/16/2022)   Received from [facility]   Hunger Vital Sign    Within the past 12 months, you worried that your food would run out before you got money to buy more: Never true    Within the past 12 months, the food you  bought just didn't last and you didn't have money to get more. : Never true  Transportation Needs: No Transportation Needs (12/16/2022)   Received from [facility]    In the past 12 months, has lack of reliable transportation kept you from medical appointments, meetings, work or from getting things needed for daily living? : No  Physical Activity: Not on file  Stress: Not on file  Social Connections: Not on file  Intimate Partner Violence: Not on file   Current Outpatient Medications on File Prior to Visit  Medication Sig Dispense Refill   amLODipine (NORVASC) 5 MG tablet 1 tablet Orally Once a day for 90 days     Continuous Glucose Sensor (DEXCOM G7 SENSOR) MISC 3 each by Does not apply route every 30 (thirty) days. Apply 1 sensor every 10 days 9 each 4   glipiZIDE  (GLUCOTROL ) 5 MG tablet Take 1 tablet (5 mg total) by mouth daily before supper. 90 tablet 3   glucose blood test strip Check sugars 2x a day - One Touch 200 each 3   ibuprofen  (ADVIL ) 800 MG tablet Take 1 tablet (800 mg total) by mouth every 8 (eight) hours as needed for moderate pain or cramping. 21 tablet 0   insulin  degludec (TRESIBA  FLEXTOUCH) 200 UNIT/ML FlexTouch Pen Inject 28 Units into the skin daily. 9 mL 1   Insulin  Pen Needle 32G X 4 MM MISC Use 1x a day 100 each 3   loratadine (CLARITIN) 10 MG tablet Take 10 mg by mouth daily.     losartan (COZAAR) 50 MG tablet losartan 50 mg tablet  TAKE 1 TABLET BY MOUTH ONCE DAILY     methocarbamol  (ROBAXIN ) 500 MG tablet Take 1 tablet (500 mg total) by mouth 2 (two) times daily. 20 tablet 0   ondansetron  (ZOFRAN -ODT) 4 MG disintegrating tablet Take 1 tablet (4 mg total) by mouth every 8 (eight) hours as needed for nausea or vomiting. 20 tablet 0   OneTouch Delica Lancets 33G MISC Check sugars 2x a day 200 each 3   rosuvastatin  (CRESTOR ) 5 MG tablet rosuvastatin  5 mg tablet  TAKE 1 TABLET BY MOUTH ONCE DAILY IN THE EVENING 90 tablet 3   sitaGLIPtin  (JANUVIA ) 100  MG tablet Take 1 tablet (100 mg total) by mouth daily. 90 tablet 3   No current facility-administered medications on file prior to visit.   No Known Allergies Family History  Problem Relation Age of Onset   Diabetes Father    Diabetes Paternal Grandmother    Hypertension Paternal Aunt  Anesthesia problems Neg Hx    Hypotension Neg Hx    Malignant hyperthermia Neg Hx    Pseudochol deficiency Neg Hx    ROS: + see HPI  Past Medical History:  Diagnosis Date   Active labor 12/29/2011   Anemia    FeSO4 taken   Heart murmur    Hypercholesteremia    Hypertension    NSVD (normal spontaneous vaginal delivery) 12/29/2011   Scoliosis    Sickle cell trait (HCC)    Yeast infection    NOT FREQUENT   Past Surgical History:  Procedure Laterality Date   NO PAST SURGERIES     Social History   Socioeconomic History   Marital status: Single    Spouse name: Not on file   Number of children: 1   Years of education: 31   Highest education level: Not on file  Occupational History   Occupation: [employer]  Tobacco Use   Smoking status: Never   Smokeless tobacco: Never  Vaping Use   Vaping status: Never Used  Substance and Sexual Activity   Alcohol use: No   Drug use: No   Sexual activity: Not Currently    Birth control/protection: None    Comment: currently pregnant  Other Topics Concern   Not on file  Social History Narrative   Not on file   Social Drivers of Health   Financial Resource Strain: Not on file  Food Insecurity: Low Risk  (12/16/2022)   Received from [facility]   Hunger Vital Sign    Within the past 12 months, you worried that your food would run out before you got money to buy more: Never true    Within the past 12 months, the food you bought just didn't last and you didn't have money to get more. : Never true  Transportation Needs: No Transportation Needs (12/16/2022)   Received from [facility]    In the past 12 months, has lack  of reliable transportation kept you from medical appointments, meetings, work or from getting things needed for daily living? : No  Physical Activity: Not on file  Stress: Not on file  Social Connections: Not on file  Intimate Partner Violence: Not on file   Current Outpatient Medications on File Prior to Visit  Medication Sig Dispense Refill   amLODipine (NORVASC) 5 MG tablet 1 tablet Orally Once a day for 90 days     Continuous Glucose Sensor (DEXCOM G7 SENSOR) MISC 3 each by Does not apply route every 30 (thirty) days. Apply 1 sensor every 10 days 9 each 4   glipiZIDE  (GLUCOTROL ) 5 MG tablet Take 1 tablet (5 mg total) by mouth daily before supper. 90 tablet 3   glucose blood test strip Check sugars 2x a day - One Touch 200 each 3   ibuprofen  (ADVIL ) 800 MG tablet Take 1 tablet (800 mg total) by mouth every 8 (eight) hours as needed for moderate pain or cramping. 21 tablet 0   insulin  degludec (TRESIBA  FLEXTOUCH) 200 UNIT/ML FlexTouch Pen Inject 28 Units into the skin daily. 9 mL 1   Insulin  Pen Needle 32G X 4 MM MISC Use 1x a day 100 each 3   loratadine (CLARITIN) 10 MG tablet Take 10 mg by mouth daily.     losartan (COZAAR) 50 MG tablet losartan 50 mg tablet  TAKE 1 TABLET BY MOUTH ONCE DAILY     methocarbamol  (ROBAXIN ) 500 MG tablet Take 1 tablet (500 mg total) by  mouth 2 (two) times daily. 20 tablet 0   ondansetron  (ZOFRAN -ODT) 4 MG disintegrating tablet Take 1 tablet (4 mg total) by mouth every 8 (eight) hours as needed for nausea or vomiting. 20 tablet 0   OneTouch Delica Lancets 33G MISC Check sugars 2x a day 200 each 3   rosuvastatin  (CRESTOR ) 5 MG tablet rosuvastatin  5 mg tablet  TAKE 1 TABLET BY MOUTH ONCE DAILY IN THE EVENING 90 tablet 3   sitaGLIPtin  (JANUVIA ) 100 MG tablet Take 1 tablet (100 mg total) by mouth daily. 90 tablet 3   No current facility-administered medications on file prior to visit.   No Known Allergies Family History  Problem Relation Age of Onset    Diabetes Father    Diabetes Paternal Grandmother    Hypertension Paternal Aunt    Anesthesia problems Neg Hx    Hypotension Neg Hx    Malignant hyperthermia Neg Hx    Pseudochol deficiency Neg Hx    PE: BP 130/80   Pulse 73   Ht 4' 11 (1.499 m)   Wt 164 lb (74.4 kg)   SpO2 99%   BMI 33.12 kg/m  Wt Readings from Last 3 Encounters:  10/26/23 164 lb (74.4 kg)  06/26/23 165 lb (74.8 kg)  06/25/23 163 lb (73.9 kg)   Constitutional: overweight, in NAD Eyes:  EOMI, no exophthalmos ENT: no neck masses, no cervical lymphadenopathy Cardiovascular:  RRR, No MRG Respiratory: CTA B Musculoskeletal: no deformities Skin:no rashes Neurological: no tremor with outstretched hands  ASSESSMENT: 1. DM2, insulin -dependent, uncontrolled, without long-term complications, but with hyperglycemia - pt is now pregnant, in her first trimester  2. HL  PLAN:  1. Patient with longstanding, uncontrolled, type 2 diabetes on long-acting insulin  and sulfonylurea, and previously also on DPP 4 inhibitor, which we stopped 2 days ago after detection of pregnancy.  At last visit, HbA1c was lower, at 6.5%.   - At today's visit, we discussed about checking blood sugars frequently during pregnancy, ideally recommended to check 8 times a day, before and after meals and at bedtime, also lower target for blood sugars during pregnancy: Before meals <95, 1h after a meal <140 2h after a meal <120  -Also, ideally, HbA1c is lower than 6%. - At today's visit, unfortunately, sugars have increased significantly due to pregnancy.  She mentions that before the pregnancy they were exemplary.  She is currently off Januvia  and glipizide  and only on Tresiba .  She increased the dose by herself when she saw the sugars increasing up to 400s.  They started to improve afterwards, but they are still elevated.  At today's visit we discussed about the need to start mealtime insulin .  I advised her to start at a lower dose and adjust the  dose up as needed to meet glycemic targets.  For now, we will continue to her dose of Tresiba .  I encouraged her to stay in touch with me about the blood sugars before next visit.   - I would really want her on a CGM.  I sent a prescription for the freestyle libre 3+ CGM  to her pharmacy.  The Dexcom was not covered for her previously. - I suggested to:  Patient Instructions  Please continue: - Tresiba  46 units in a.m  Please add: - NovoLog  10-20 units 3x a day 15 min before meals   Blood sugar goals in pregnancy: Before meals <95, 1h after a meal <140 2h after a meal <120   HbA1c goal for pregnancy: <6%  Please return in 1 months with your sugar log.   - we checked her HbA1c: 9.3% (much higher) - advised to check sugars at different times of the day - 8x a day, rotating check times - advised for yearly eye exams >> she is due to -strongly advised to schedule now that she is pregnant - return to clinic in 1 month  2.  Hyperlipidemia - Latest lipid panel was reviewed from 02/2023: LDL above our target of less than 100, otherwise fractions at goal: Lab Results  Component Value Date   CHOL 174 02/24/2023   HDL 45.70 02/24/2023   LDLCALC 117 (H) 02/24/2023   TRIG 59.0 02/24/2023   CHOLHDL 4 02/24/2023  - At last visit she was on Crestor  5 mg daily but she is now off for the pregnancy  Lela Fendt, MD PhD [facility]

## 2023-10-26 NOTE — Telephone Encounter (Signed)
 Pharmacy Patient Advocate Encounter   Received notification from RX Request Messages that prior authorization for Tresiba  200 UNIT/ML FlexTouch Pen is required/requested.   Insurance verification completed.   The patient is insured through [facility] [employer] .   Per test claim: Refill too soon. PA is not needed at this time. Medication was filled 10/05/23. Next eligible fill date is 10/28/23. Insurance prefers brand name Tresiba 

## 2023-10-27 ENCOUNTER — Ambulatory Visit: Admitting: Internal Medicine

## 2023-10-27 ENCOUNTER — Ambulatory Visit: Payer: Self-pay | Admitting: Internal Medicine

## 2023-10-27 LAB — MICROALBUMIN / CREATININE URINE RATIO
Creatinine, Urine: 80 mg/dL (ref 20–275)
Microalb, Ur: <0.2 mg/dL

## 2023-10-27 NOTE — Telephone Encounter (Signed)
 Noted

## 2023-10-30 NOTE — Telephone Encounter (Signed)
 Refill request complete

## 2023-11-03 ENCOUNTER — Encounter (HOSPITAL_BASED_OUTPATIENT_CLINIC_OR_DEPARTMENT_OTHER): Payer: Self-pay

## 2023-11-05 DIAGNOSIS — O3680X0 Pregnancy with inconclusive fetal viability, not applicable or unspecified: Secondary | ICD-10-CM | POA: Diagnosis not present

## 2023-11-05 DIAGNOSIS — O021 Missed abortion: Secondary | ICD-10-CM | POA: Diagnosis not present

## 2023-11-11 DIAGNOSIS — O021 Missed abortion: Secondary | ICD-10-CM | POA: Diagnosis not present

## 2023-11-11 DIAGNOSIS — O039 Complete or unspecified spontaneous abortion without complication: Secondary | ICD-10-CM | POA: Diagnosis not present

## 2023-11-19 DIAGNOSIS — O021 Missed abortion: Secondary | ICD-10-CM | POA: Diagnosis not present

## 2023-11-24 ENCOUNTER — Encounter (HOSPITAL_BASED_OUTPATIENT_CLINIC_OR_DEPARTMENT_OTHER): Admitting: Certified Nurse Midwife

## 2023-12-08 ENCOUNTER — Ambulatory Visit: Admitting: Internal Medicine

## 2023-12-15 ENCOUNTER — Encounter: Payer: Self-pay | Admitting: Internal Medicine

## 2023-12-30 ENCOUNTER — Telehealth: Payer: Self-pay | Admitting: Internal Medicine

## 2023-12-30 DIAGNOSIS — N760 Acute vaginitis: Secondary | ICD-10-CM | POA: Diagnosis not present

## 2023-12-30 DIAGNOSIS — I10 Essential (primary) hypertension: Secondary | ICD-10-CM | POA: Diagnosis not present

## 2023-12-30 IMAGING — CR DG CHEST 2V
1 series · 2 of 2 positions shown · non-contrast
Comparison: 12/13/2020

CLINICAL DATA: Shortness of breath.  Fatigue and dizziness.

EXAM:
CHEST - 2 VIEW

[Series 1: dg chest 2 view · 0.14mm/px · 2 of 2 slices shown]
[im 1/2]
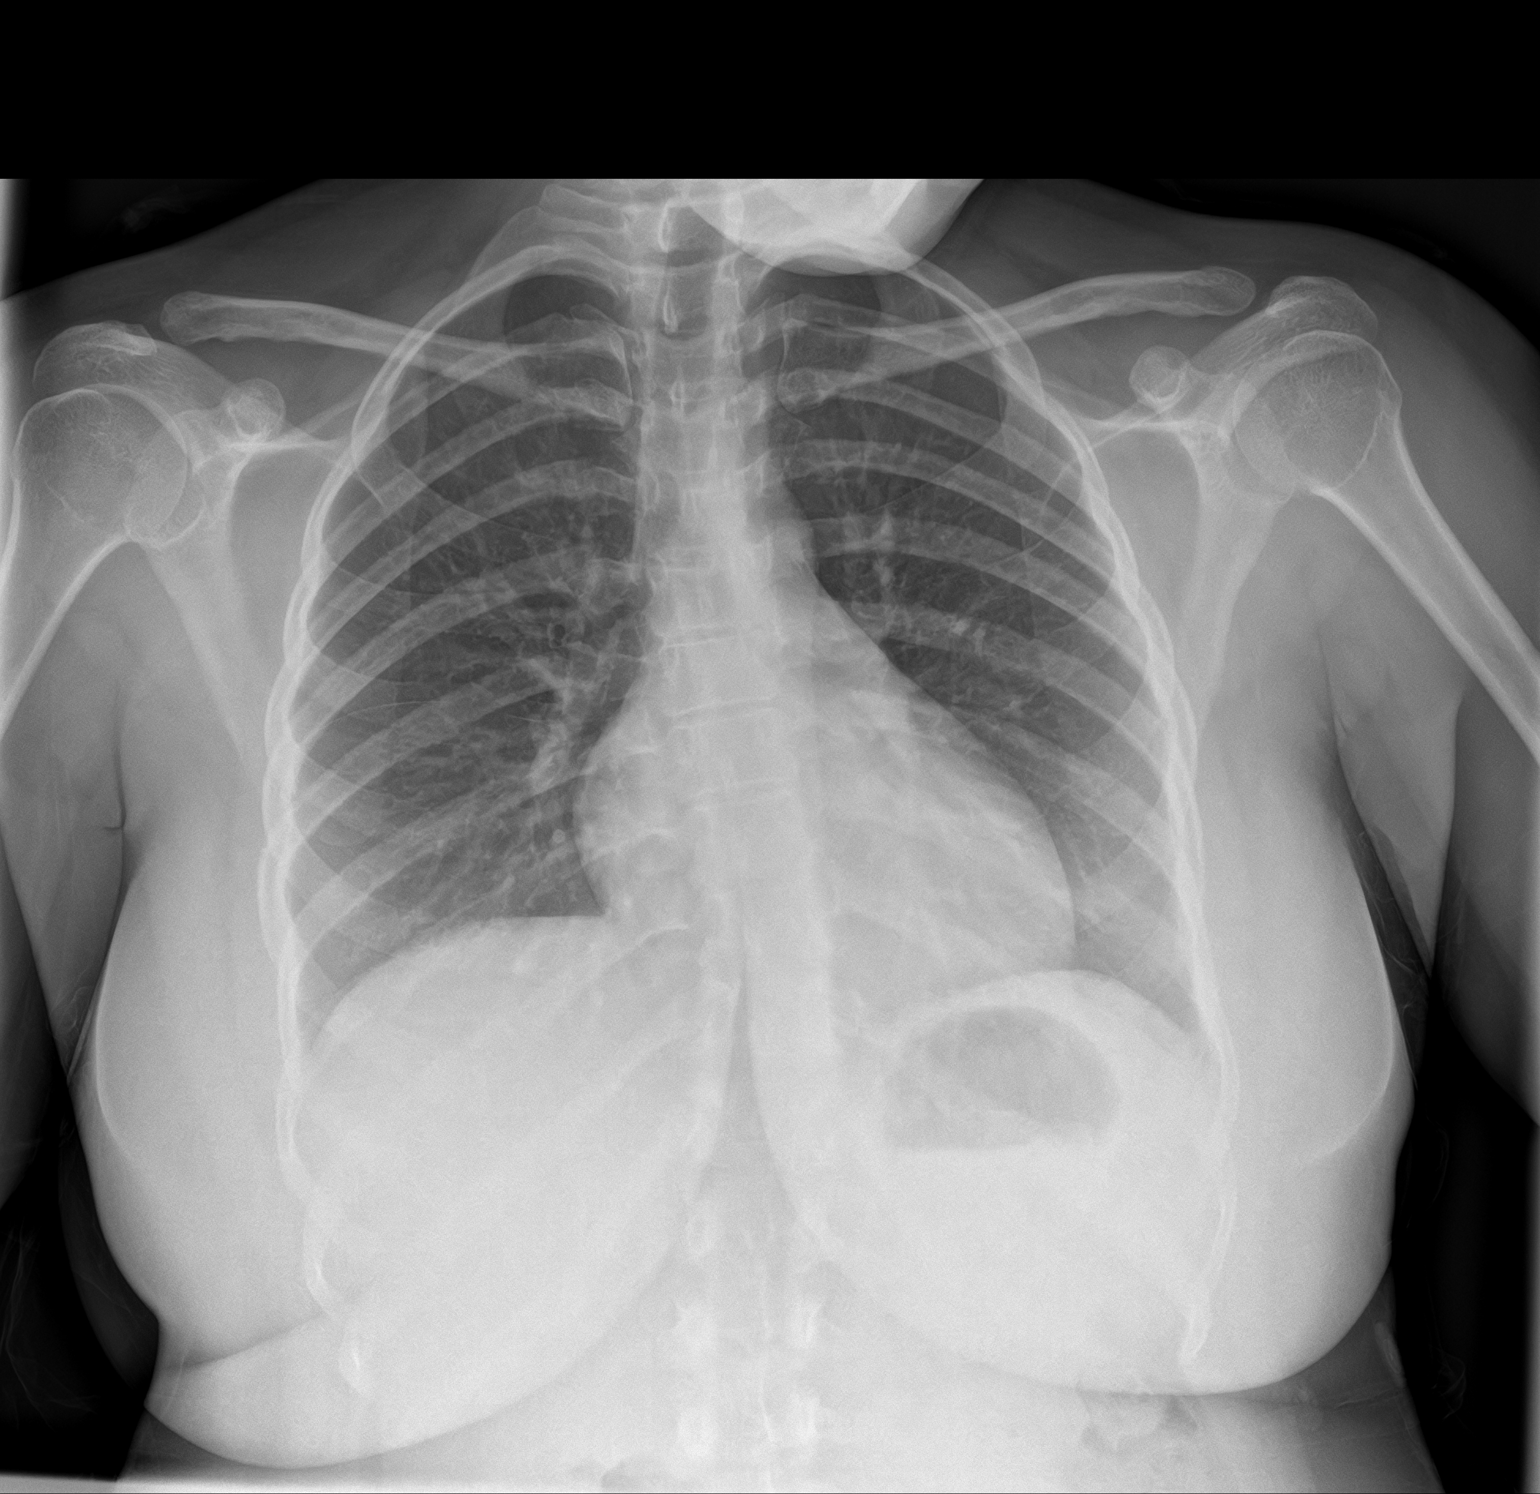
[im 2/2]
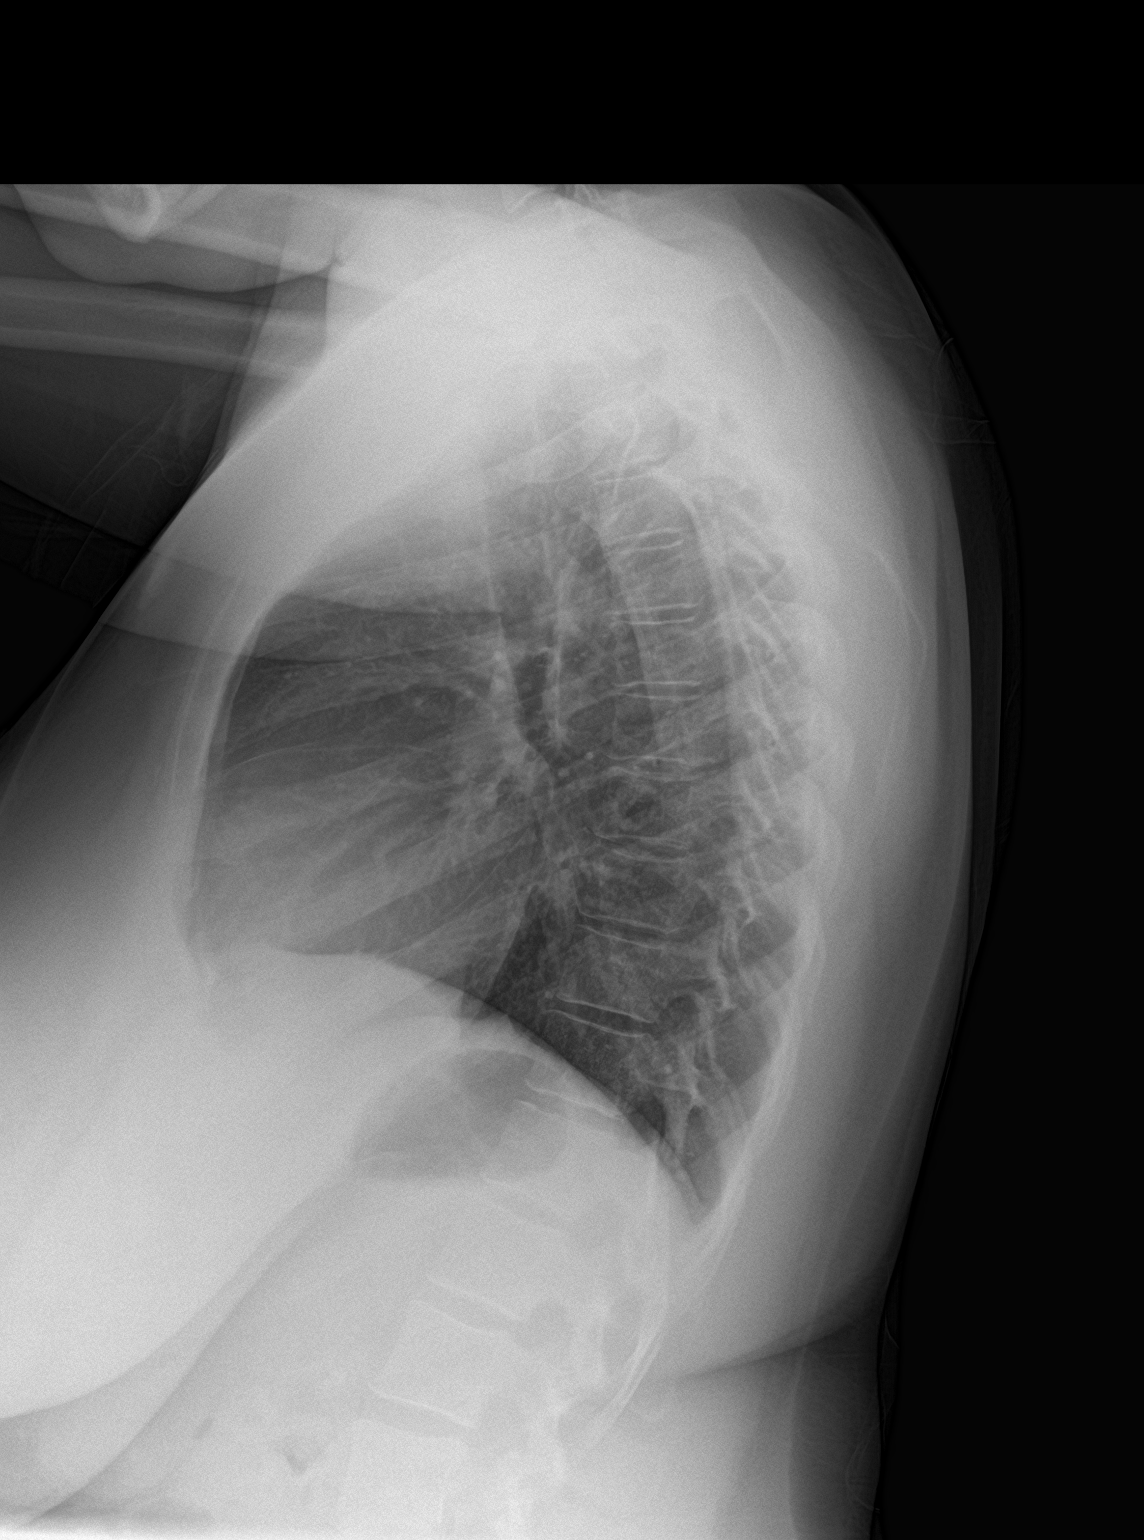

[2 of 2 positions shown; findings below may reference images not displayed]

FINDINGS: The heart size and mediastinal contours are within normal limits.
Both lungs are clear. The visualized skeletal structures are
unremarkable.
IMPRESSION: No active cardiopulmonary disease.

## 2023-12-30 NOTE — Telephone Encounter (Signed)
 MEDICATION: New Glucose Meter, Test Strips, Lancer, and Lancets  PHARMACY:  [facility] [address] in [place]

## 2023-12-31 ENCOUNTER — Other Ambulatory Visit: Payer: Self-pay

## 2023-12-31 DIAGNOSIS — O24111 Pre-existing diabetes mellitus, type 2, in pregnancy, first trimester: Secondary | ICD-10-CM

## 2023-12-31 MED ORDER — ACCU-CHEK SOFTCLIX LANCETS MISC: 3 refills | Status: DC

## 2023-12-31 MED ORDER — ACCU-CHEK GUIDE W/DEVICE KIT: PACK | 0 refills | Status: AC

## 2023-12-31 MED ORDER — ACCU-CHEK GUIDE TEST VI STRP: ORAL_STRIP | 12 refills | Status: DC

## 2024-01-07 DIAGNOSIS — Z794 Long term (current) use of insulin: Secondary | ICD-10-CM | POA: Diagnosis not present

## 2024-01-07 DIAGNOSIS — I10 Essential (primary) hypertension: Secondary | ICD-10-CM | POA: Diagnosis not present

## 2024-01-07 DIAGNOSIS — E1165 Type 2 diabetes mellitus with hyperglycemia: Secondary | ICD-10-CM | POA: Diagnosis not present

## 2024-01-20 DIAGNOSIS — N3000 Acute cystitis without hematuria: Secondary | ICD-10-CM | POA: Diagnosis not present

## 2024-01-20 DIAGNOSIS — R350 Frequency of micturition: Secondary | ICD-10-CM | POA: Diagnosis not present

## 2024-01-20 DIAGNOSIS — B3731 Acute candidiasis of vulva and vagina: Secondary | ICD-10-CM | POA: Diagnosis not present

## 2024-01-22 ENCOUNTER — Encounter: Payer: Self-pay | Admitting: Internal Medicine

## 2024-01-22 DIAGNOSIS — R0989 Other specified symptoms and signs involving the circulatory and respiratory systems: Secondary | ICD-10-CM | POA: Diagnosis not present

## 2024-01-22 DIAGNOSIS — Z331 Pregnant state, incidental: Secondary | ICD-10-CM | POA: Diagnosis not present

## 2024-01-22 DIAGNOSIS — I10 Essential (primary) hypertension: Secondary | ICD-10-CM | POA: Diagnosis not present

## 2024-01-22 DIAGNOSIS — R300 Dysuria: Secondary | ICD-10-CM | POA: Diagnosis not present

## 2024-01-23 ENCOUNTER — Inpatient Hospital Stay (HOSPITAL_COMMUNITY)
Admission: AD | Admit: 2024-01-23 | Discharge: 2024-01-27 | DRG: 770 | Disposition: A | Discharge status: Home / Self Care | Source: Ambulatory Visit | Attending: Obstetrics and Gynecology | Admitting: Obstetrics and Gynecology

## 2024-01-23 ENCOUNTER — Inpatient Hospital Stay (HOSPITAL_COMMUNITY)

## 2024-01-23 ENCOUNTER — Other Ambulatory Visit: Payer: Self-pay

## 2024-01-23 ENCOUNTER — Encounter (HOSPITAL_COMMUNITY): Payer: Self-pay

## 2024-01-23 DIAGNOSIS — E11649 Type 2 diabetes mellitus with hypoglycemia without coma: Secondary | ICD-10-CM | POA: Diagnosis present

## 2024-01-23 DIAGNOSIS — O24111 Pre-existing diabetes mellitus, type 2, in pregnancy, first trimester: Secondary | ICD-10-CM | POA: Diagnosis present

## 2024-01-23 DIAGNOSIS — Z1152 Encounter for screening for COVID-19: Secondary | ICD-10-CM

## 2024-01-23 DIAGNOSIS — Z833 Family history of diabetes mellitus: Secondary | ICD-10-CM

## 2024-01-23 DIAGNOSIS — D696 Thrombocytopenia, unspecified: Secondary | ICD-10-CM | POA: Diagnosis not present

## 2024-01-23 DIAGNOSIS — E7800 Pure hypercholesterolemia, unspecified: Secondary | ICD-10-CM | POA: Insufficient documentation

## 2024-01-23 DIAGNOSIS — Z8249 Family history of ischemic heart disease and other diseases of the circulatory system: Secondary | ICD-10-CM | POA: Diagnosis not present

## 2024-01-23 DIAGNOSIS — Z3A01 Less than 8 weeks gestation of pregnancy: Secondary | ICD-10-CM

## 2024-01-23 DIAGNOSIS — O98311 Other infections with a predominantly sexual mode of transmission complicating pregnancy, first trimester: Secondary | ICD-10-CM | POA: Diagnosis present

## 2024-01-23 DIAGNOSIS — O99011 Anemia complicating pregnancy, first trimester: Secondary | ICD-10-CM | POA: Diagnosis present

## 2024-01-23 DIAGNOSIS — E1165 Type 2 diabetes mellitus with hyperglycemia: Secondary | ICD-10-CM | POA: Diagnosis present

## 2024-01-23 DIAGNOSIS — O10911 Unspecified pre-existing hypertension complicating pregnancy, first trimester: Secondary | ICD-10-CM | POA: Diagnosis present

## 2024-01-23 DIAGNOSIS — Z3A11 11 weeks gestation of pregnancy: Secondary | ICD-10-CM | POA: Diagnosis not present

## 2024-01-23 DIAGNOSIS — A6000 Herpesviral infection of urogenital system, unspecified: Secondary | ICD-10-CM | POA: Diagnosis present

## 2024-01-23 DIAGNOSIS — O3411 Maternal care for benign tumor of corpus uteri, first trimester: Secondary | ICD-10-CM | POA: Diagnosis not present

## 2024-01-23 DIAGNOSIS — R8271 Bacteriuria: Secondary | ICD-10-CM | POA: Diagnosis not present

## 2024-01-23 DIAGNOSIS — Z794 Long term (current) use of insulin: Secondary | ICD-10-CM

## 2024-01-23 DIAGNOSIS — O0387 Sepsis following complete or unspecified spontaneous abortion: Principal | ICD-10-CM | POA: Diagnosis present

## 2024-01-23 DIAGNOSIS — D259 Leiomyoma of uterus, unspecified: Secondary | ICD-10-CM | POA: Diagnosis not present

## 2024-01-23 DIAGNOSIS — E1110 Type 2 diabetes mellitus with ketoacidosis without coma: Secondary | ICD-10-CM | POA: Diagnosis not present

## 2024-01-23 DIAGNOSIS — E876 Hypokalemia: Secondary | ICD-10-CM | POA: Diagnosis not present

## 2024-01-23 DIAGNOSIS — D573 Sickle-cell trait: Secondary | ICD-10-CM | POA: Diagnosis present

## 2024-01-23 DIAGNOSIS — Z7984 Long term (current) use of oral hypoglycemic drugs: Secondary | ICD-10-CM

## 2024-01-23 DIAGNOSIS — R748 Abnormal levels of other serum enzymes: Secondary | ICD-10-CM | POA: Diagnosis not present

## 2024-01-23 DIAGNOSIS — O209 Hemorrhage in early pregnancy, unspecified: Secondary | ICD-10-CM

## 2024-01-23 DIAGNOSIS — O26893 Other specified pregnancy related conditions, third trimester: Secondary | ICD-10-CM | POA: Diagnosis present

## 2024-01-23 DIAGNOSIS — O99111 Other diseases of the blood and blood-forming organs and certain disorders involving the immune mechanism complicating pregnancy, first trimester: Secondary | ICD-10-CM | POA: Diagnosis present

## 2024-01-23 DIAGNOSIS — R509 Fever, unspecified: Principal | ICD-10-CM | POA: Diagnosis present

## 2024-01-23 LAB — CBC WITH DIFFERENTIAL/PLATELET
Abs Immature Granulocytes: 0.05 K/uL (ref 0.00–0.07)
Basophils Absolute: 0 K/uL (ref 0.0–0.1)
Basophils Relative: 0 %
Eosinophils Absolute: 0 K/uL (ref 0.0–0.5)
Eosinophils Relative: 0 %
HCT: 36.8 % (ref 36.0–46.0)
Hemoglobin: 13.7 g/dL (ref 12.0–15.0)
Immature Granulocytes: 1 %
Lymphocytes Relative: 10 %
Lymphs Abs: 0.9 K/uL (ref 0.7–4.0)
MCH: 29.4 pg (ref 26.0–34.0)
MCHC: 37.2 g/dL — ABNORMAL HIGH (ref 30.0–36.0)
MCV: 79 fL — ABNORMAL LOW (ref 80.0–100.0)
Monocytes Absolute: 0.8 K/uL (ref 0.1–1.0)
Monocytes Relative: 10 %
Neutro Abs: 6.9 K/uL (ref 1.7–7.7)
Neutrophils Relative %: 79 %
Platelets: 168 K/uL (ref 150–400)
RBC: 4.66 MIL/uL (ref 3.87–5.11)
RDW: 13.7 % (ref 11.5–15.5)
WBC: 8.7 K/uL (ref 4.0–10.5)
nRBC: 0 % (ref 0.0–0.2)

## 2024-01-23 LAB — COMPREHENSIVE METABOLIC PANEL WITH GFR
ALT: 90 U/L — ABNORMAL HIGH (ref 0–44)
AST: 85 U/L — ABNORMAL HIGH (ref 15–41)
Albumin: 3.5 g/dL (ref 3.5–5.0)
Alkaline Phosphatase: 52 U/L (ref 38–126)
Anion gap: 17 — ABNORMAL HIGH (ref 5–15)
BUN: <5 mg/dL — ABNORMAL LOW (ref 6–20)
CO2: 19 mmol/L — ABNORMAL LOW (ref 22–32)
Calcium: 8.2 mg/dL — ABNORMAL LOW (ref 8.9–10.3)
Chloride: 95 mmol/L — ABNORMAL LOW (ref 98–111)
Creatinine, Ser: 0.72 mg/dL (ref 0.44–1.00)
GFR, Estimated: >60 mL/min (ref >60)
Glucose, Bld: 227 mg/dL — ABNORMAL HIGH (ref 70–99)
Potassium: 2.7 mmol/L — CL (ref 3.5–5.1)
Sodium: 131 mmol/L — ABNORMAL LOW (ref 135–145)
Total Bilirubin: 0.4 mg/dL (ref 0.0–1.2)
Total Protein: 6.7 g/dL (ref 6.5–8.1)

## 2024-01-23 LAB — WET PREP, GENITAL
Clue Cells Wet Prep HPF POC: NONE SEEN
Sperm: NONE SEEN
Trich, Wet Prep: NONE SEEN
WBC, Wet Prep HPF POC: >=10 — AB (ref <10)
Yeast Wet Prep HPF POC: NONE SEEN

## 2024-01-23 LAB — URINALYSIS, ROUTINE W REFLEX MICROSCOPIC
Bilirubin Urine: NEGATIVE
Glucose, UA: >=500 mg/dL — AB
Ketones, ur: 80 mg/dL — AB
Nitrite: NEGATIVE
Protein, ur: 100 mg/dL — AB
Specific Gravity, Urine: 1.022 (ref 1.005–1.030)
WBC, UA: >50 WBC/hpf (ref 0–5)
pH: 6 (ref 5.0–8.0)

## 2024-01-23 LAB — RESP PANEL BY RT-PCR (RSV, FLU A&B, COVID)  RVPGX2
Influenza A by PCR: NEGATIVE
Influenza B by PCR: NEGATIVE
Resp Syncytial Virus by PCR: NEGATIVE
SARS Coronavirus 2 by RT PCR: NEGATIVE

## 2024-01-23 LAB — BETA-HYDROXYBUTYRIC ACID: Beta-Hydroxybutyric Acid: 0.87 mmol/L — ABNORMAL HIGH (ref 0.05–0.27)

## 2024-01-23 LAB — PROTIME-INR
INR: 1.1 (ref 0.8–1.2)
Prothrombin Time: 14.4 s (ref 11.4–15.2)

## 2024-01-23 LAB — LACTIC ACID, PLASMA
Lactic Acid, Venous: 1.6 mmol/L (ref 0.5–1.9)
Lactic Acid, Venous: 1.1 mmol/L (ref 0.5–1.9)

## 2024-01-23 LAB — HEMOGLOBIN A1C
Hgb A1c MFr Bld: 6.5 % — ABNORMAL HIGH (ref 4.8–5.6)
Mean Plasma Glucose: 139.85 mg/dL

## 2024-01-23 LAB — GLUCOSE, CAPILLARY
Glucose-Capillary: 229 mg/dL — ABNORMAL HIGH (ref 70–99)
Glucose-Capillary: 243 mg/dL — ABNORMAL HIGH (ref 70–99)

## 2024-01-23 LAB — HCG, QUANTITATIVE, PREGNANCY: hCG, Beta Chain, Quant, S: 2836 m[IU]/mL — ABNORMAL HIGH (ref <5)

## 2024-01-23 LAB — OSMOLALITY: Osmolality: 282 mosm/kg (ref 275–295)

## 2024-01-23 LAB — POCT PREGNANCY, URINE: Preg Test, Ur: POSITIVE — AB

## 2024-01-23 LAB — APTT: aPTT: 31 s (ref 24–36)

## 2024-01-23 LAB — MAGNESIUM: Magnesium: 1.5 mg/dL — ABNORMAL LOW (ref 1.7–2.4)

## 2024-01-23 MED ORDER — LABETALOL HCL 5 MG/ML IV SOLN: 80.0000 mg | INTRAVENOUS | Status: DC | PRN

## 2024-01-23 MED ORDER — PRENATAL MULTIVITAMIN CH
1.0000 | ORAL_TABLET | Freq: Every day | ORAL | Status: DC
  Administered 2024-01-25: 1 via ORAL
  Filled 2024-01-23: qty 1

## 2024-01-23 MED ORDER — LACTATED RINGERS IV BOLUS (SEPSIS): 1000.0000 mL | Freq: Once | INTRAVENOUS | Status: DC

## 2024-01-23 MED ORDER — LABETALOL HCL 100 MG PO TABS: 200.0000 mg | ORAL_TABLET | Freq: Two times a day (BID) | ORAL | Status: DC

## 2024-01-23 MED ORDER — POTASSIUM CHLORIDE 10 MEQ/100ML IV SOLN
10.0000 meq | INTRAVENOUS | Status: DC
Start: 2024-01-23 — End: 2024-01-24
  Administered 2024-01-23: 10 meq via INTRAVENOUS
  Filled 2024-01-23 (×3): qty 100

## 2024-01-23 MED ORDER — LACTATED RINGERS IV BOLUS
1000.0000 mL | Freq: Once | INTRAVENOUS | Status: AC
  Administered 2024-01-23: 1000 mL via INTRAVENOUS

## 2024-01-23 MED ORDER — HYDRALAZINE HCL 20 MG/ML IJ SOLN: 10.0000 mg | INTRAMUSCULAR | Status: DC | PRN

## 2024-01-23 MED ORDER — METRONIDAZOLE 500 MG/100ML IV SOLN
500.0000 mg | Freq: Two times a day (BID) | INTRAVENOUS | Status: DC
  Administered 2024-01-23: 500 mg via INTRAVENOUS
  Filled 2024-01-23 (×2): qty 100

## 2024-01-23 MED ORDER — POTASSIUM CHLORIDE CRYS ER 20 MEQ PO TBCR
40.0000 meq | EXTENDED_RELEASE_TABLET | Freq: Once | ORAL | Status: AC
  Administered 2024-01-23: 40 meq via ORAL
  Filled 2024-01-23: qty 2

## 2024-01-23 MED ORDER — SODIUM CHLORIDE 0.9 % IV SOLN
2.0000 g | Freq: Three times a day (TID) | INTRAVENOUS | Status: DC
  Filled 2024-01-23: qty 12.5

## 2024-01-23 MED ORDER — LACTATED RINGERS IV SOLN: 150.0000 mL/h | INTRAVENOUS | Status: DC

## 2024-01-23 MED ORDER — VANCOMYCIN HCL 1.25 G IV SOLR
1250.0000 mg | Freq: Once | INTRAVENOUS | Status: AC
  Administered 2024-01-23: 1250 mg via INTRAVENOUS
  Filled 2024-01-23: qty 25

## 2024-01-23 MED ORDER — LABETALOL HCL 5 MG/ML IV SOLN: 40.0000 mg | INTRAVENOUS | Status: DC | PRN

## 2024-01-23 MED ORDER — LABETALOL HCL 200 MG PO TABS
200.0000 mg | ORAL_TABLET | Freq: Two times a day (BID) | ORAL | Status: DC
  Administered 2024-01-23 – 2024-01-27 (×7): 200 mg via ORAL
  Filled 2024-01-23: qty 1
  Filled 2024-01-23: qty 2
  Filled 2024-01-23 (×5): qty 1

## 2024-01-23 MED ORDER — LACTATED RINGERS IV SOLN: 125.0000 mL/h | INTRAVENOUS | Status: DC

## 2024-01-23 MED ORDER — VANCOMYCIN HCL 1000 MG IV SOLR
750.0000 mg | Freq: Two times a day (BID) | INTRAVENOUS | Status: DC
  Filled 2024-01-23: qty 15

## 2024-01-23 MED ORDER — DOCUSATE SODIUM 100 MG PO CAPS: 100.0000 mg | ORAL_CAPSULE | Freq: Every day | ORAL | Status: DC

## 2024-01-23 MED ORDER — ACETAMINOPHEN 500 MG PO TABS
1000.0000 mg | ORAL_TABLET | Freq: Four times a day (QID) | ORAL | Status: DC | PRN
  Administered 2024-01-24 – 2024-01-26 (×6): 1000 mg via ORAL
  Filled 2024-01-23 (×6): qty 2

## 2024-01-23 MED ORDER — SODIUM CHLORIDE 0.9 % IV SOLN
2.0000 g | Freq: Once | INTRAVENOUS | Status: AC
  Administered 2024-01-23: 2 g via INTRAVENOUS
  Filled 2024-01-23: qty 12.5

## 2024-01-23 MED ORDER — POTASSIUM CHLORIDE 10 MEQ/100ML IV SOLN
10.0000 meq | INTRAVENOUS | Status: DC
  Filled 2024-01-23 (×5): qty 100

## 2024-01-23 MED ORDER — LACTATED RINGERS IV BOLUS (SEPSIS): 250.0000 mL | Freq: Once | INTRAVENOUS | Status: DC

## 2024-01-23 MED ORDER — VANCOMYCIN HCL 10 G IV SOLR
1500.0000 mg | Freq: Once | INTRAVENOUS | Status: DC
  Filled 2024-01-23: qty 15

## 2024-01-23 MED ORDER — DIPHENHYDRAMINE HCL 25 MG PO CAPS
25.0000 mg | ORAL_CAPSULE | Freq: Four times a day (QID) | ORAL | Status: DC | PRN
  Administered 2024-01-23 – 2024-01-26 (×2): 25 mg via ORAL
  Filled 2024-01-23 (×2): qty 1

## 2024-01-23 MED ORDER — CALCIUM CARBONATE ANTACID 500 MG PO CHEW: 2.0000 | CHEWABLE_TABLET | ORAL | Status: DC | PRN

## 2024-01-23 MED ORDER — LABETALOL HCL 5 MG/ML IV SOLN: 20.0000 mg | INTRAVENOUS | Status: DC | PRN

## 2024-01-23 MED ORDER — SODIUM CHLORIDE 0.9 % IV BOLUS: 1000.0000 mL | Freq: Once | INTRAVENOUS | Status: DC

## 2024-01-23 MED ADMIN — Acetaminophen Tab 500 MG: 1000 mg | ORAL | NDC 50580045711

## 2024-01-23 MED FILL — Acetaminophen Tab 500 MG: 1000.0000 mg | ORAL | Qty: 2 | Status: AC

## 2024-01-23 NOTE — H&P (Incomplete)
 [facility] History and Physical  Allison Mathews is a 33 y.o. [id number] at [redacted]w[redacted]d by LMP who presented to [facility] for lab abnormalities noted by her PCP.   Pt reports being seen at Urgent Care earlier this week for dysuria and urinary frequency. They dropped her urine sample so didn't test it but started empiric antibiotics. She reports starting abx 2 days ago. Patient saw her PCP yesterday for malaise and feeling hot. She then got a call from her PCP letting her know that her glucose, anion gap, and liver enzymes were high and potassium was low. He recommended being seen in [facility].   On arrival here, she reported cramping in her legs, but no other complaints. She has since reported vaginal bleeding, some lower abdominal cramping, decreased appetite, diarrhea and malaise. She also has 2 vulvar ulcers that she reports are painful & have been present for a couple of days. No hx HSV. Denies URI symptoms, cough, congestion, nausea/vomiting.  She was febrile to 103*F, tachycardic to the 130s, and hypertensive. Sepsis protocol was activated but there was a delay in receiving abx/IVF due to difficulties obtaining IV access. Despite this, vitals normalized with tylenol  1g x 1 alone.   On exam, abdomen is soft & non tender. Speculum exam with dark blood in the vault but no active bleeding. Cervix visually closed. Uterus small, mobile with mild tenderness when prompted.   US  w/ 6mm ([redacted]w[redacted]d) intrauterine gestational sac, no YS/embryo visualized.  Labs reviewed: - CBC w/out leukocytosis (WC 8.7) or anemia (Hgb 13.7) - CMP w/ hyponatremia (131), hypokalemia (2.7), bicarb 19, glu 227, mild transaminitis (85, 90), anion gap 17, mag 1.5. Normal Cr (0.72) - lactate normal (1.6) - Normal INR/PTT - Wet prep +WBCs - RVP negative  Past Medical History:  Diagnosis Date   Anemia    FeSO4 taken   Diabetes type 2 (HCC) 2022   Heart murmur    Hypercholesteremia    Hypertension    Scoliosis     Sickle cell trait    Yeast infection    NOT FREQUENT   Past Surgical History:  Procedure Laterality Date   NO PAST SURGERIES     OB History  Gravida Para Term Preterm AB Living  4 2 2  1 2   SAB IAB Ectopic Multiple Live Births  1    2    # Outcome Date GA Lbr Len/2nd Weight Sex Type Anes PTL Lv  4 Current           3 Term 12/29/11 [redacted]w[redacted]d 14:54 / 01:10 3125 g M Vag-Spont EPI  LIV     Birth Comments: WNL  2 Term 08/20/08 [redacted]w[redacted]d 12:00 2977 g M Vag-Vacuum EPI N LIV     Birth Comments: NO COMPLICATIONS, GBS POS  1 SAB           Patient denies any other pertinent gynecologic issues.  No current facility-administered medications on file prior to encounter.   Current Outpatient Medications on File Prior to Encounter  Medication Sig Dispense Refill   Accu-Chek Softclix Lancets lancets Use as instructed 1x a day 100 each 3   Blood Glucose Monitoring Suppl (ACCU-CHEK GUIDE) w/Device KIT Use as advised 1 kit 0   insulin  aspart (NOVOLOG  FLEXPEN) 100 UNIT/ML FlexPen Inject 10-20 Units into the skin 3 (three) times daily with meals. 30 mL 3   Insulin  Degludec FlexTouch 200 UNIT/ML SOPN INJECT 46 UNITS INTO THE SKIN DAILY. 18 mL 3   loratadine (CLARITIN)  10 MG tablet Take 10 mg by mouth daily.     Continuous Glucose Sensor (FREESTYLE LIBRE 3 PLUS SENSOR) MISC 1 each by Does not apply route every 14 (fourteen) days. 6 each 3   glipiZIDE  (GLUCOTROL ) 5 MG tablet Take 1 tablet (5 mg total) by mouth daily before supper. (Patient not taking: Reported on 10/26/2023) 90 tablet 3   glucose blood (ACCU-CHEK GUIDE TEST) test strip Use as instructed 100 each 12   Insulin  Pen Needle 32G X 4 MM MISC Use 4x a day 400 each 3   NIFEdipine  (PROCARDIA -XL/NIFEDICAL-XL) 30 MG 24 hr tablet Take 30 mg by mouth daily.     ondansetron  (ZOFRAN -ODT) 4 MG disintegrating tablet Take 1 tablet (4 mg total) by mouth every 8 (eight) hours as needed for nausea or vomiting. (Patient not taking: Reported on 10/26/2023) 20 tablet 0    No Known Allergies  Social History:   reports that she has never smoked. She has never used smokeless tobacco. She reports that she does not drink alcohol and does not use drugs. Family History  Problem Relation Age of Onset   Diabetes Father    Hypertension Paternal Aunt    Diabetes Paternal Grandmother    Anesthesia problems Neg Hx    Hypotension Neg Hx    Malignant hyperthermia Neg Hx    Pseudochol deficiency Neg Hx    Review of Systems: Pertinent items noted in HPI and remainder of comprehensive ROS otherwise negative.  PHYSICAL EXAM: Blood pressure 129/71, pulse 97, temperature 98.7 F (37.1 C), temperature source Oral, resp. rate 20, weight 73.6 kg, last menstrual period 12/11/2023, SpO2 100%. GENERAL: A&Ox3, flat affect but conversant and in no acute distress SKIN: Skin is warm and dry. No rash noted. Not diaphoretic. No erythema. No pallor. CARDIOVASCULAR: Normal heart rate noted RESPIRATORY: Effort normal, no problems with respiration noted ABDOMEN: Soft, nontender, nondistended PELVIC:  - SSE: shallow vulvar ulceration x 2 on left labia majora & minora, tender but no palpable mass/abscess, no significant surrounding erythema/warmth/tenderness. <3cc of dark blood in the vaginal vault with no active bleeding, no purulent/abnormal discharge - BME no cervical masses, uterus small mobile; initially no tenderness but when asked pt reported 8/10 pain with palpation of uterus/cervix  Labs: Results for orders placed or performed during the hospital encounter of 01/23/24 (from the past 2 weeks)  Glucose, capillary   Collection Time: 01/23/24  7:01 PM  Result Value Ref Range   Glucose-Capillary 229 (H) 70 - 99 mg/dL  Urinalysis, Routine w reflex microscopic -Urine, Clean Catch   Collection Time: 01/23/24  7:10 PM  Result Value Ref Range   Color, Urine YELLOW YELLOW   APPearance HAZY (A) CLEAR   Specific Gravity, Urine 1.022 1.005 - 1.030   pH 6.0 5.0 - 8.0   Glucose, UA  >=500 (A) NEGATIVE mg/dL   Hgb urine dipstick SMALL (A) NEGATIVE   Bilirubin Urine NEGATIVE NEGATIVE   Ketones, ur 80 (A) NEGATIVE mg/dL   Protein, ur 899 (A) NEGATIVE mg/dL   Nitrite NEGATIVE NEGATIVE   Leukocytes,Ua SMALL (A) NEGATIVE   RBC / HPF 21-50 0 - 5 RBC/hpf   WBC, UA >50 0 - 5 WBC/hpf   Bacteria, UA RARE (A) NONE SEEN   Squamous Epithelial / HPF 0-5 0 - 5 /HPF   Mucus PRESENT   Pregnancy, urine POC   Collection Time: 01/23/24  7:14 PM  Result Value Ref Range   Preg Test, Ur POSITIVE (A) NEGATIVE  Resp panel by  RT-PCR (RSV, Flu A&B, Covid)   Collection Time: 01/23/24  7:51 PM   Specimen: Nasal Swab  Result Value Ref Range   SARS Coronavirus 2 by RT PCR NEGATIVE NEGATIVE   Influenza A by PCR NEGATIVE NEGATIVE   Influenza B by PCR NEGATIVE NEGATIVE   Resp Syncytial Virus by PCR NEGATIVE NEGATIVE  Wet prep, genital   Collection Time: 01/23/24  7:51 PM  Result Value Ref Range   Yeast Wet Prep HPF POC NONE SEEN NONE SEEN   Trich, Wet Prep NONE SEEN NONE SEEN   Clue Cells Wet Prep HPF POC NONE SEEN NONE SEEN   WBC, Wet Prep HPF POC >=10 (A) <10   Sperm NONE SEEN   Osmolality   Collection Time: 01/23/24  8:16 PM  Result Value Ref Range   Osmolality 282 275 - 295 mOsm/kg  Beta-hydroxybutyric acid   Collection Time: 01/23/24  8:16 PM  Result Value Ref Range   Beta-Hydroxybutyric Acid 0.87 (H) 0.05 - 0.27 mmol/L  CBC with Differential/Platelet   Collection Time: 01/23/24  8:18 PM  Result Value Ref Range   WBC 8.7 4.0 - 10.5 K/uL   RBC 4.66 3.87 - 5.11 MIL/uL   Hemoglobin 13.7 12.0 - 15.0 g/dL   HCT 63.1 63.9 - 53.9 %   MCV 79.0 (L) 80.0 - 100.0 fL   MCH 29.4 26.0 - 34.0 pg   MCHC 37.2 (H) 30.0 - 36.0 g/dL   RDW 86.2 88.4 - 84.4 %   Platelets 168 150 - 400 K/uL   nRBC 0.0 0.0 - 0.2 %   Neutrophils Relative % 79 %   Neutro Abs 6.9 1.7 - 7.7 K/uL   Lymphocytes Relative 10 %   Lymphs Abs 0.9 0.7 - 4.0 K/uL   Monocytes Relative 10 %   Monocytes Absolute 0.8 0.1 -  1.0 K/uL   Eosinophils Relative 0 %   Eosinophils Absolute 0.0 0.0 - 0.5 K/uL   Basophils Relative 0 %   Basophils Absolute 0.0 0.0 - 0.1 K/uL   Immature Granulocytes 1 %   Abs Immature Granulocytes 0.05 0.00 - 0.07 K/uL  Comprehensive metabolic panel   Collection Time: 01/23/24  8:18 PM  Result Value Ref Range   Sodium 131 (L) 135 - 145 mmol/L   Potassium 2.7 (LL) 3.5 - 5.1 mmol/L   Chloride 95 (L) 98 - 111 mmol/L   CO2 19 (L) 22 - 32 mmol/L   Glucose, Bld 227 (H) 70 - 99 mg/dL   BUN <5 (L) 6 - 20 mg/dL   Creatinine, Ser 9.27 0.44 - 1.00 mg/dL   Calcium  8.2 (L) 8.9 - 10.3 mg/dL   Total Protein 6.7 6.5 - 8.1 g/dL   Albumin 3.5 3.5 - 5.0 g/dL   AST 85 (H) 15 - 41 U/L   ALT 90 (H) 0 - 44 U/L   Alkaline Phosphatase 52 38 - 126 U/L   Total Bilirubin 0.4 0.0 - 1.2 mg/dL   GFR, Estimated >39 >39 mL/min   Anion gap 17 (H) 5 - 15  Magnesium    Collection Time: 01/23/24  8:18 PM  Result Value Ref Range   Magnesium  1.5 (L) 1.7 - 2.4 mg/dL  Lactic acid, plasma   Collection Time: 01/23/24  8:18 PM  Result Value Ref Range   Lactic Acid, Venous 1.6 0.5 - 1.9 mmol/L  Protime-INR   Collection Time: 01/23/24  8:18 PM  Result Value Ref Range   Prothrombin Time 14.4 11.4 - 15.2 seconds  INR 1.1 0.8 - 1.2  APTT   Collection Time: 01/23/24  8:18 PM  Result Value Ref Range   aPTT 31 24 - 36 seconds  Hemoglobin A1c   Collection Time: 01/23/24  8:18 PM  Result Value Ref Range   Hgb A1c MFr Bld 6.5 (H) 4.8 - 5.6 %   Mean Plasma Glucose 139.85 mg/dL  hCG, quantitative, pregnancy   Collection Time: 01/23/24  8:18 PM  Result Value Ref Range   hCG, Beta Chain, Quant, S 2,836 (H) <5 mIU/mL  Glucose, capillary   Collection Time: 01/23/24 10:27 PM  Result Value Ref Range   Glucose-Capillary 243 (H) 70 - 99 mg/dL  Lactic acid, plasma   Collection Time: 01/23/24 10:38 PM  Result Value Ref Range   Lactic Acid, Venous 1.1 0.5 - 1.9 mmol/L   Imaging Studies: US  OB LESS THAN 14 WEEKS WITH OB  TRANSVAGINAL Result Date: 01/23/2024 EXAM: OBSTETRIC ULTRASOUND FIRST TRIMESTER TECHNIQUE: Transvaginal first trimester obstetric pelvic duplex ultrasound was performed with real-time imaging, color flow Doppler imaging, and spectral analysis. COMPARISON: CT abdomen and pelvis 04/07/2022. CLINICAL HISTORY: Pregnancy, location unknown [phone]. last menstrual: 12/11/2023. Gestational age by last menstrual: 6 weeks and 1 day. Estimated due date: 09/16/2024 FINDINGS: UTERUS: 3.1 x 2.9 x 2.7 cm left uterine fibroid. GESTATIONAL SAC(S): Single intrauterine gestational sac within the endometrium. No subchorionic hemorrhage. YOLK SAC: No yolk sac. EMBRYO(<11WK) /FETUS(>=11WK): No embryo. CROWN RUMP LENGTH: Not measured. RIGHT OVARY: Unremarkable. Normal arterial and venous flow. LEFT OVARY: Unremarkable. Normal arterial and venous flow. FREE FLUID: No free fluid. MEASUREMENTS ESTIMATED GESTATIONAL AGE BY CURRENT ULTRASOUND: 5 weeks and 2 days gestation (based on mean sac diameter of 6 mm). IMPRESSION: 1. Probable early intrauterine gestational sac, but no yolk sac, fetal pole, or cardiac activity yet visualized. Recommend follow-up quantitative B-HCG levels and follow-up US  in 14 days to assess viability. This recommendation follows SRU consensus guidelines: Diagnostic Criteria for Nonviable Pregnancy Early in the First Trimester. LOISE Diedra PARAS Med 2013; 630:8556-48. 2. Left uterine fibroid measuring 3.1 x 2.9 x 2.7 cm. Electronically signed by: Morgane Naveau MD 01/23/2024 09:06 PM EDT RP Workstation: [id number]    Assessment: Principal Problem:   Fever Active Problems:   Hyperglycemia due to type 2 diabetes mellitus (HCC)  Plan: Fever - Unclear source at this time: ddx includes UTI, gastroenteritis, skin/soft tissue infection (vulva), or septic abortion. Has received abx x 1-2d prior to her presentation which may affect her Ucx results if urinary source - This is a desired pregnancy and given diagnostic  uncertainty re: septic AB, no leukocytosis/lactic acidosis, and rapid improvement with tylenol  alone, would not recommend D&E at this time. Low threshold to proceed with surgical management if clinical status changes - Bcx, Ucx, GC/CT & HSV testing pending - Continue vanc/cefepime/flagyl - potential reaction to vanc during infusion (see note from rapid response RN). D/w pharmacy who agree with restarting at a lower rate  2. Hyperglycemia, c/f DKA; hypokalemia - potentially precipitated by infectious source - s/p 1L LR, 2nd bolus ordered - s/p PO K 40mEq w/ 3 runs of IV K 10mEq ordered - BMP q4h - Anticipate starting IV insulin  when K > 3.5 - Medicine consulted, appreciate recs  Dispo: admit to antepartum with hospitalist consultation   Kieth Carolin, MD Obstetrician & Gynecologist, [facility] for [facility], [facility]

## 2024-01-23 NOTE — MAU Provider Note (Cosign Needed Addendum)
 Chief Complaint:  Extremity Weakness and Leg Pain   HPI   Event Date/Time   First Provider Initiated Contact with Patient 01/23/24 1921      Allison Mathews is a 33 y.o. [phone] at [redacted]w[redacted]d who presents to maternity admissions after being sent by her OB due to fasting glucose 243, AST 56, ALT 71, potassium 3.2 on labs.  Patient has type 2 diabetes and has been working with endocrinology for her insulin  regimen, but it has not been working well.  She does report that today, she has had only high carb foods.  She also has chronic hypertension and was previously taking amlodipine with good hypertensive control.  When she found out she was pregnant, her OB switched her to nifedipine .  The nifedipine  caused headaches and she also reports hot flashes and chills since starting it.  She went to the [facility] office yesterday and they told her to discontinue the nifedipine  and started labetalol instead.  She was going to pick up the labetalol today, but was called to come to MAU before picking it up.  Pregnancy Course: No prenatal care with OBGYN yet, upcoming office visit with [facility]. PCP has been managing/changing BP medication.  Past Medical History:  Diagnosis Date   Anemia    FeSO4 taken   Diabetes type 2 (HCC) 2022   Heart murmur    Hypercholesteremia    Hypertension    Scoliosis    Sickle cell trait    Yeast infection    NOT FREQUENT   OB History  Gravida Para Term Preterm AB Living  4 2 2  1 2   SAB IAB Ectopic Multiple Live Births  1    2    # Outcome Date GA Lbr Len/2nd Weight Sex Type Anes PTL Lv  4 Current           3 Term 12/29/11 [redacted]w[redacted]d 14:54 / 01:10 3125 g M Vag-Spont EPI  LIV     Birth Comments: WNL  2 Term 08/20/08 [redacted]w[redacted]d 12:00 2977 g M Vag-Vacuum EPI N LIV     Birth Comments: NO COMPLICATIONS, GBS POS  1 SAB            Past Surgical History:  Procedure Laterality Date   NO PAST SURGERIES     Family History  Problem Relation Age of Onset   Diabetes Father     Hypertension Paternal Aunt    Diabetes Paternal Grandmother    Anesthesia problems Neg Hx    Hypotension Neg Hx    Malignant hyperthermia Neg Hx    Pseudochol deficiency Neg Hx    Social History   Tobacco Use   Smoking status: Never   Smokeless tobacco: Never  Vaping Use   Vaping status: Never Used  Substance Use Topics   Alcohol use: No   Drug use: No   No Known Allergies Medications Prior to Admission  Medication Sig Dispense Refill Last Dose/Taking   Accu-Chek Softclix Lancets lancets Use as instructed 1x a day 100 each 3 01/23/2024   Blood Glucose Monitoring Suppl (ACCU-CHEK GUIDE) w/Device KIT Use as advised 1 kit 0 01/23/2024   insulin  aspart (NOVOLOG  FLEXPEN) 100 UNIT/ML FlexPen Inject 10-20 Units into the skin 3 (three) times daily with meals. 30 mL 3 01/23/2024 at  7:30 AM   Insulin  Degludec FlexTouch 200 UNIT/ML SOPN INJECT 46 UNITS INTO THE SKIN DAILY. 18 mL 3 01/22/2024 at  6:00 PM   loratadine (CLARITIN) 10 MG tablet Take 10 mg  by mouth daily.   Past Month   Continuous Glucose Sensor (FREESTYLE LIBRE 3 PLUS SENSOR) MISC 1 each by Does not apply route every 14 (fourteen) days. 6 each 3    glipiZIDE  (GLUCOTROL ) 5 MG tablet Take 1 tablet (5 mg total) by mouth daily before supper. (Patient not taking: Reported on 10/26/2023) 90 tablet 3    glucose blood (ACCU-CHEK GUIDE TEST) test strip Use as instructed 100 each 12    Insulin  Pen Needle 32G X 4 MM MISC Use 4x a day 400 each 3    NIFEdipine  (PROCARDIA -XL/NIFEDICAL-XL) 30 MG 24 hr tablet Take 30 mg by mouth daily.      ondansetron  (ZOFRAN -ODT) 4 MG disintegrating tablet Take 1 tablet (4 mg total) by mouth every 8 (eight) hours as needed for nausea or vomiting. (Patient not taking: Reported on 10/26/2023) 20 tablet 0     I have reviewed patient's Past Medical Hx, Surgical Hx, Family Hx, Social Hx, medications and allergies.   ROS  Pertinent items noted in HPI and remainder of comprehensive ROS otherwise negative.    PHYSICAL EXAM  Patient Vitals for the past 24 hrs:  BP Temp Temp src Pulse Resp SpO2 Weight  01/23/24 2140 -- -- -- -- -- 100 % --  01/23/24 2134 (!) 144/78 -- -- 83 -- -- --  01/23/24 2042 (!) 141/76 -- -- 95 -- -- --  01/23/24 2016 138/89 -- -- (!) 105 -- -- --  01/23/24 2012 -- 99 F (37.2 C) Oral -- -- -- --  01/23/24 2010 135/80 -- -- (!) 106 -- 100 % --  01/23/24 1946 (!) 142/79 -- -- (!) 123 -- -- --  01/23/24 1931 (!) 159/88 -- -- (!) 123 -- -- --  01/23/24 1915 (!) 158/85 (!) 103 F (39.4 C) Oral (!) 131 -- 99 % --  01/23/24 1848 (!) 168/79 (!) 102.9 F (39.4 C) -- (!) 135 20 100 % --  01/23/24 1837 -- -- -- -- -- -- 73.6 kg    Constitutional: Well-developed, well-nourished female in no acute distress. Not ill appearing. HEENT: atraumatic, normocephalic. Neck has normal ROM. EOM intact. Cardiovascular: normal rate & rhythm, warm and well-perfused Respiratory: normal effort, no problems with respiration noted GI: Abd soft, non-tender, non-distended MSK: Extremities nontender, no edema, normal ROM Skin: warm and dry. Acyanotic, no jaundice or pallor. Neurologic: Alert and oriented x 4. No abnormal coordination. Psychiatric: Normal mood. Speech not slurred, not rapid/pressured. Patient is cooperative. GU: no CVA tenderness  Labs: Results for orders placed or performed during the hospital encounter of 01/23/24 (from the past 24 hours)  Glucose, capillary     Status: Abnormal   Collection Time: 01/23/24  7:01 PM  Result Value Ref Range   Glucose-Capillary 229 (H) 70 - 99 mg/dL  Urinalysis, Routine w reflex microscopic -Urine, Clean Catch     Status: Abnormal   Collection Time: 01/23/24  7:10 PM  Result Value Ref Range   Color, Urine YELLOW YELLOW   APPearance HAZY (A) CLEAR   Specific Gravity, Urine 1.022 1.005 - 1.030   pH 6.0 5.0 - 8.0   Glucose, UA >=500 (A) NEGATIVE mg/dL   Hgb urine dipstick SMALL (A) NEGATIVE   Bilirubin Urine NEGATIVE NEGATIVE   Ketones,  ur 80 (A) NEGATIVE mg/dL   Protein, ur 899 (A) NEGATIVE mg/dL   Nitrite NEGATIVE NEGATIVE   Leukocytes,Ua SMALL (A) NEGATIVE   RBC / HPF 21-50 0 - 5 RBC/hpf   WBC, UA >50 0 -  5 WBC/hpf   Bacteria, UA RARE (A) NONE SEEN   Squamous Epithelial / HPF 0-5 0 - 5 /HPF   Mucus PRESENT   Pregnancy, urine POC     Status: Abnormal   Collection Time: 01/23/24  7:14 PM  Result Value Ref Range   Preg Test, Ur POSITIVE (A) NEGATIVE  Resp panel by RT-PCR (RSV, Flu A&B, Covid)     Status: None   Collection Time: 01/23/24  7:51 PM   Specimen: Nasal Swab  Result Value Ref Range   SARS Coronavirus 2 by RT PCR NEGATIVE NEGATIVE   Influenza A by PCR NEGATIVE NEGATIVE   Influenza B by PCR NEGATIVE NEGATIVE   Resp Syncytial Virus by PCR NEGATIVE NEGATIVE  Wet prep, genital     Status: Abnormal   Collection Time: 01/23/24  7:51 PM  Result Value Ref Range   Yeast Wet Prep HPF POC NONE SEEN NONE SEEN   Trich, Wet Prep NONE SEEN NONE SEEN   Clue Cells Wet Prep HPF POC NONE SEEN NONE SEEN   WBC, Wet Prep HPF POC >=10 (A) <10   Sperm NONE SEEN   CBC with Differential/Platelet     Status: Abnormal   Collection Time: 01/23/24  8:18 PM  Result Value Ref Range   WBC 8.7 4.0 - 10.5 K/uL   RBC 4.66 3.87 - 5.11 MIL/uL   Hemoglobin 13.7 12.0 - 15.0 g/dL   HCT 63.1 63.9 - 53.9 %   MCV 79.0 (L) 80.0 - 100.0 fL   MCH 29.4 26.0 - 34.0 pg   MCHC 37.2 (H) 30.0 - 36.0 g/dL   RDW 86.2 88.4 - 84.4 %   Platelets 168 150 - 400 K/uL   nRBC 0.0 0.0 - 0.2 %   Neutrophils Relative % 79 %   Neutro Abs 6.9 1.7 - 7.7 K/uL   Lymphocytes Relative 10 %   Lymphs Abs 0.9 0.7 - 4.0 K/uL   Monocytes Relative 10 %   Monocytes Absolute 0.8 0.1 - 1.0 K/uL   Eosinophils Relative 0 %   Eosinophils Absolute 0.0 0.0 - 0.5 K/uL   Basophils Relative 0 %   Basophils Absolute 0.0 0.0 - 0.1 K/uL   Immature Granulocytes 1 %   Abs Immature Granulocytes 0.05 0.00 - 0.07 K/uL  Comprehensive metabolic panel     Status: Abnormal   Collection  Time: 01/23/24  8:18 PM  Result Value Ref Range   Sodium 131 (L) 135 - 145 mmol/L   Potassium 2.7 (LL) 3.5 - 5.1 mmol/L   Chloride 95 (L) 98 - 111 mmol/L   CO2 19 (L) 22 - 32 mmol/L   Glucose, Bld 227 (H) 70 - 99 mg/dL   BUN <5 (L) 6 - 20 mg/dL   Creatinine, Ser 9.27 0.44 - 1.00 mg/dL   Calcium  8.2 (L) 8.9 - 10.3 mg/dL   Total Protein 6.7 6.5 - 8.1 g/dL   Albumin 3.5 3.5 - 5.0 g/dL   AST 85 (H) 15 - 41 U/L   ALT 90 (H) 0 - 44 U/L   Alkaline Phosphatase 52 38 - 126 U/L   Total Bilirubin 0.4 0.0 - 1.2 mg/dL   GFR, Estimated >39 >39 mL/min   Anion gap 17 (H) 5 - 15  Magnesium      Status: Abnormal   Collection Time: 01/23/24  8:18 PM  Result Value Ref Range   Magnesium  1.5 (L) 1.7 - 2.4 mg/dL  Lactic acid, plasma     Status: None  Collection Time: 01/23/24  8:18 PM  Result Value Ref Range   Lactic Acid, Venous 1.6 0.5 - 1.9 mmol/L  Protime-INR     Status: None   Collection Time: 01/23/24  8:18 PM  Result Value Ref Range   Prothrombin Time 14.4 11.4 - 15.2 seconds   INR 1.1 0.8 - 1.2  APTT     Status: None   Collection Time: 01/23/24  8:18 PM  Result Value Ref Range   aPTT 31 24 - 36 seconds  Hemoglobin A1c     Status: Abnormal   Collection Time: 01/23/24  8:18 PM  Result Value Ref Range   Hgb A1c MFr Bld 6.5 (H) 4.8 - 5.6 %   Mean Plasma Glucose 139.85 mg/dL  hCG, quantitative, pregnancy     Status: Abnormal   Collection Time: 01/23/24  8:18 PM  Result Value Ref Range   hCG, Beta Chain, Quant, S 2,836 (H) <5 mIU/mL   Imaging:  US  OB LESS THAN 14 WEEKS WITH OB TRANSVAGINAL Result Date: 01/23/2024 EXAM: OBSTETRIC ULTRASOUND FIRST TRIMESTER TECHNIQUE: Transvaginal first trimester obstetric pelvic duplex ultrasound was performed with real-time imaging, color flow Doppler imaging, and spectral analysis. COMPARISON: CT abdomen and pelvis 04/07/2022. CLINICAL HISTORY: Pregnancy, location unknown [phone]. last menstrual: 12/11/2023. Gestational age by last menstrual: 6 weeks and 1  day. Estimated due date: 09/16/2024 FINDINGS: UTERUS: 3.1 x 2.9 x 2.7 cm left uterine fibroid. GESTATIONAL SAC(S): Single intrauterine gestational sac within the endometrium. No subchorionic hemorrhage. YOLK SAC: No yolk sac. EMBRYO(<11WK) /FETUS(>=11WK): No embryo. CROWN RUMP LENGTH: Not measured. RIGHT OVARY: Unremarkable. Normal arterial and venous flow. LEFT OVARY: Unremarkable. Normal arterial and venous flow. FREE FLUID: No free fluid. MEASUREMENTS ESTIMATED GESTATIONAL AGE BY CURRENT ULTRASOUND: 5 weeks and 2 days gestation (based on mean sac diameter of 6 mm). IMPRESSION: 1. Probable early intrauterine gestational sac, but no yolk sac, fetal pole, or cardiac activity yet visualized. Recommend follow-up quantitative B-HCG levels and follow-up US  in 14 days to assess viability. This recommendation follows SRU consensus guidelines: Diagnostic Criteria for Nonviable Pregnancy Early in the First Trimester. LOISE Diedra PARAS Med 2013; 630:8556-48. 2. Left uterine fibroid measuring 3.1 x 2.9 x 2.7 cm. Electronically signed by: Morgane Naveau MD 01/23/2024 09:06 PM EDT RP Workstation: [id number]    MDM & MAU COURSE  MDM:   MAU Course: -Febrile, hypertensive, tachycardic. Patient does not appear uncomfortable or ill. -Discussed with Dr. Herchel, initiating fluids for possible DKA and initiating sepsis protocol given unexplained fever. -US  to determine viability. -UA significant for ketonuria, glucosuria, proteinuria. Will also send urine culture. -Tylenol  for fever and IV antibiotics for sepsis. -BP and fever improved with Tylenol . -CBC, lactic acid, PTT, and INR within normal limits. -Care taken over by Ala Cart CNM at 2100. -Joesph Sear PA-C  Orders Placed This Encounter  Procedures   Resp panel by RT-PCR (RSV, Flu A&B, Covid) Anterior Nasal Swab   Culture, blood (x 2)   Wet prep, genital   US  OB LESS THAN 14 WEEKS WITH OB TRANSVAGINAL   CBC with Differential/Platelet   Comprehensive  metabolic panel   Magnesium    Glucose, capillary   Urinalysis, Routine w reflex microscopic -Urine, Clean Catch   Lactic acid, plasma   Protime-INR   APTT   Hemoglobin A1c   hCG, quantitative, pregnancy   .HSV 1/2 PCR, SURFACE (performed in [facility] lab)   Osmolality   Beta-hydroxybutyric acid   Apply Sepsis Care Plan   If lactate (lactic acid) >2, verify  repeat lactic acid order has been placed to be drawn   Document vital signs within 1-hour of fluid bolus completion and notify provider of bolus completion   Vital signs   Vital signs   Assess and Document Glasgow Coma Scale   RN to call RRT (rapid response team) and provider   Refer to Sidebar Report: Sepsis Bundle ED/IP   Code Sepsis activation.  This occurs automatically when order is signed and prioritizes pharmacy, lab, and radiology services for STAT collections and interventions.  If [facility] downtime, call [facility] [phone]) to activate Code Sepsis.   ceFEPime (MAXIPIME) per pharmacy consult            vancomycin per pharmacy consult   Airborne and Contact precautions   Pregnancy, urine POC   Meds ordered this encounter  Medications   DISCONTD: sodium chloride  0.9 % bolus 1,000 mL   DISCONTD: labetalol (NORMODYNE) injection 20 mg   DISCONTD: labetalol (NORMODYNE) injection 40 mg   DISCONTD: labetalol (NORMODYNE) injection 80 mg   DISCONTD: hydrALAZINE (APRESOLINE) injection 10 mg   DISCONTD: lactated ringers  infusion   DISCONTD: lactated ringers  bolus 1,000 mL    Total Body Weight basis for 30 mL/kg  bolus delivery:   73.6 kg   DISCONTD: lactated ringers  bolus 1,000 mL    Total Body Weight basis for 30 mL/kg  bolus delivery:   73.6 kg   DISCONTD: lactated ringers  bolus 250 mL    Total Body Weight basis for 30 mL/kg  bolus delivery:   73.6 kg   acetaminophen  (TYLENOL ) tablet 1,000 mg   lactated ringers  bolus 1,000 mL   DISCONTD: labetalol (NORMODYNE) tablet 200 mg   ceFEPIme (MAXIPIME) 2 g in sodium chloride  0.9 % 100 mL  IVPB    Antibiotic Indication::   Other Indication (list below)    Other Indication::   Unknown source   metroNIDAZOLE (FLAGYL) IVPB 500 mg    Antibiotic Indication::   Other Indication (list below)    Other Indication::   Unknown source   DISCONTD: vancomycin (VANCOCIN) 1,500 mg in sodium chloride  0.9 % 500 mL IVPB    Indication::   Other Indication (list below)    Other Indication::   Unknown source   Vancomycin (VANCOCIN) 1,250 mg in sodium chloride  0.9 % 250 mL IVPB    Indication::   Other Indication (list below)    Other Indication::   Unknown source   labetalol (NORMODYNE) tablet 200 mg   vancomycin (VANCOCIN) 750 mg in sodium chloride  0.9 % 250 mL IVPB    Indication::   Sepsis   ceFEPIme (MAXIPIME) 2 g in sodium chloride  0.9 % 100 mL IVPB    Antibiotic Indication::   Sepsis   potassium chloride  10 mEq in 100 mL IVPB   *Consult with Dr. Erik @ 2115 - notified of patient's complaints, assessments, lab results -- will come to evaluate patient.  Dr. Erik on unit and assumes care of patient @ 2120.    Ala Cart, CNM  01/23/2024 10:23 PM  ASSESSMENT  No diagnosis found.  PLAN  Labs pending at change of shift.   Allergies as of 01/23/2024   (No Known Allergies)

## 2024-01-23 NOTE — Sepsis Progress Note (Signed)
 Elink monitoring for the code sepsis protocol.

## 2024-01-23 NOTE — Significant Event (Signed)
 Rapid Response Event Note   Reason for Call :  Asked to evaluate pt for decreased MS.   Pt here for fever/sepsis evaluation.  Pt developed decreased mental status approx. 30 minutes ago. Per RN, pt recently received vancomycin, maxipime, and flagyl.  Initial Focused Assessment:  Pt lying in bed with eyes open, in no visible distress. She is looking around but will not speak. With continued verbal stimulation, pt will follow commands, move all extremities, and, eventually, will speak and answer simple questions. NIH-0. During exam pt will have episodes where she will not speak but will answer questions using her hands or shaking her head yes or no. Pupils 5, equal, reactive. Lungs clear t/o. ABD soft. While examining pt, she abruptly sat straight up in bed and began complaining that her back was burning. There is no visible rash on her body but her face is reddened. Skin warm and dry.  HR-92, BP-131/81, RR-20, SpO2-100% on RA  Interventions:  Benadryl  25mg  PO for potential med reaction.  Plan of Care:  Pt has no focal deficits on exam. When she isn't speaking, she is able to communicate by shaking her head and using her hands. Her affect is very flat. Benadryl  given in case this could be a reaction to her abx. Continue to monitor closely. Please call RRT if further assistance needed.   Event Summary:   MD Notified: Dr. Erik at bedside on RRT arrival.  Call Time:2318 Arrival Time:2321 End Upfz:[id number]  Tish Graeme Piety, RN

## 2024-01-23 NOTE — Significant Event (Incomplete)
 Rapid Response Event Note   Reason for Call :  Asked to evaluate pt for decreased MS.   Pt here for fever/sepsis evaluation.  Pt developed decreased mental status approx. 30 minutes ago. Per RN, pt recently received vancomycin, maxipime, and flagyl.  Initial Focused Assessment:  Pt lying in bed with eyes open, in no visible distress. She is looking around but will not speak. With continued verbal stimulation, pt will follow commands, move all extremities, and, eventually, will speak and answer simple questions. NIH-0. During exam pt will have episodes where she will not speak but will answer questions using her hands or shaking her head yes or no. Pupils 5, equal, reactive. Lungs clear t/o. ABD soft. While examining pt, she abruptly sat straight up in bed and began complaining that her back was burning. There is no visible rash on her body but her face is reddened. Skin warm and dry.  HR-92, BP-131/81, RR-20, SpO2-100% on RA  Interventions:  Benadryl  25mg  PO for potential med reaction.  Plan of Care:  Pt seems \   Event Summary:   MD Notified:  Call Upfz:[id number] Arrival Time:2321 End Time:  Tish Graeme Piety, RN

## 2024-01-23 NOTE — Progress Notes (Signed)
 Pharmacy Antibiotic Note  Allison Mathews is a 33 y.o. female admitted on 01/23/2024 with fever, tachycardia and elevated BP at 6 w1d gestation.  Pharmacy has been consulted for Vancomycin and Cefepime dosing for sepsis from unknown source.  Plan: Vancomycin 1250mg  x 1 then 750mg  IV q12h. Will target troughs 15-15mcg/ml. Calculated AUC: 472 Calculated Css max: 27.6  Calculated Css min:14.3  Cefepime 2 grams IV q8h   Weight: 73.6 kg (162 lb 3 oz)  Temp (24hrs), Avg:103 F (39.4 C), Min:102.9 F (39.4 C), Max:103 F (39.4 C)  No results for input(s): WBC, CREATININE, LATICACIDVEN, VANCOTROUGH, VANCOPEAK, VANCORANDOM, GENTTROUGH, GENTPEAK, GENTRANDOM, TOBRATROUGH, TOBRAPEAK, TOBRARND, AMIKACINPEAK, AMIKACINTROU, AMIKACIN in the last 168 hours.  CrCl cannot be calculated (Patient's most recent lab result is older than the maximum 21 days allowed.).    No Known Allergies  Antimicrobials this admission: Metronidazole 500mg  IV q12h 10/18 >>  Microbiology results: 10/18 BCx: pending 10/18 BCx: pending   Thank you for allowing pharmacy to be a part of this patient's care.  Clayborne Alfonso MATSU 01/23/2024 7:59 PM

## 2024-01-23 NOTE — MAU Note (Signed)
.  Allison Mathews is a 33 y.o. at Unknown here in MAU  was  Patient reports she  Ast 56 Alt 71 K+3.2 243 fasting glucose Onset of complaint:  Pain score: leg and arm pain 5/10 Vitals:   01/23/24 1848  BP: (!) 168/79  Pulse: (!) 135  Resp: 20  Temp: (!) 102.9 F (39.4 C)  SpO2: 100%      Lab orders placed from triage:   ua

## 2024-01-23 NOTE — Progress Notes (Signed)
 IV obtained by CRNA.

## 2024-01-23 NOTE — MAU Note (Signed)
 CRITICAL VALUE STICKER  CRITICAL VALUE: 2.7  RECEIVER (on-site recipient of call): Derrek Freund, RN  DATE & TIME NOTIFIED: 01/23/24 2109  MESSENGER (representative from lab): Carola NOVAK.  MD NOTIFIED: Ala Cart, CNM  TIME OF NOTIFICATION: 2110  RESPONSE:  See orders

## 2024-01-24 ENCOUNTER — Inpatient Hospital Stay (HOSPITAL_COMMUNITY)

## 2024-01-24 DIAGNOSIS — E1165 Type 2 diabetes mellitus with hyperglycemia: Secondary | ICD-10-CM

## 2024-01-24 DIAGNOSIS — A6000 Herpesviral infection of urogenital system, unspecified: Secondary | ICD-10-CM | POA: Diagnosis not present

## 2024-01-24 DIAGNOSIS — R509 Fever, unspecified: Secondary | ICD-10-CM

## 2024-01-24 DIAGNOSIS — R8271 Bacteriuria: Secondary | ICD-10-CM | POA: Diagnosis not present

## 2024-01-24 LAB — GLUCOSE, CAPILLARY
Glucose-Capillary: 287 mg/dL — ABNORMAL HIGH (ref 70–99)
Glucose-Capillary: 229 mg/dL — ABNORMAL HIGH (ref 70–99)
Glucose-Capillary: 258 mg/dL — ABNORMAL HIGH (ref 70–99)
Glucose-Capillary: 219 mg/dL — ABNORMAL HIGH (ref 70–99)
Glucose-Capillary: 176 mg/dL — ABNORMAL HIGH (ref 70–99)
Glucose-Capillary: 194 mg/dL — ABNORMAL HIGH (ref 70–99)
Glucose-Capillary: 149 mg/dL — ABNORMAL HIGH (ref 70–99)
Glucose-Capillary: 114 mg/dL — ABNORMAL HIGH (ref 70–99)
Glucose-Capillary: 100 mg/dL — ABNORMAL HIGH (ref 70–99)
Glucose-Capillary: 122 mg/dL — ABNORMAL HIGH (ref 70–99)
Glucose-Capillary: 190 mg/dL — ABNORMAL HIGH (ref 70–99)

## 2024-01-24 LAB — GASTROINTESTINAL PANEL BY PCR, STOOL (REPLACES STOOL CULTURE)

## 2024-01-24 LAB — CBC WITH DIFFERENTIAL/PLATELET
Abs Immature Granulocytes: 0.06 K/uL (ref 0.00–0.07)
Basophils Absolute: 0 K/uL (ref 0.0–0.1)
Basophils Relative: 0 %
Eosinophils Absolute: 0 K/uL (ref 0.0–0.5)
Eosinophils Relative: 0 %
HCT: 35.7 % — ABNORMAL LOW (ref 36.0–46.0)
Hemoglobin: 13.3 g/dL (ref 12.0–15.0)
Immature Granulocytes: 1 %
Lymphocytes Relative: 12 %
Lymphs Abs: 1.1 K/uL (ref 0.7–4.0)
MCH: 29.8 pg (ref 26.0–34.0)
MCHC: 37.3 g/dL — ABNORMAL HIGH (ref 30.0–36.0)
MCV: 80 fL (ref 80.0–100.0)
Monocytes Absolute: 0.9 K/uL (ref 0.1–1.0)
Monocytes Relative: 10 %
Neutro Abs: 7 K/uL (ref 1.7–7.7)
Neutrophils Relative %: 77 %
Platelets: 132 K/uL — ABNORMAL LOW (ref 150–400)
RBC: 4.46 MIL/uL (ref 3.87–5.11)
RDW: 13.8 % (ref 11.5–15.5)
WBC: 9.1 K/uL (ref 4.0–10.5)
nRBC: 0 % (ref 0.0–0.2)

## 2024-01-24 LAB — C DIFFICILE QUICK SCREEN W PCR REFLEX
C Diff antigen: NEGATIVE
C Diff interpretation: NOT DETECTED
C Diff toxin: NEGATIVE

## 2024-01-24 LAB — BASIC METABOLIC PANEL WITH GFR
Anion gap: 10 (ref 5–15)
Anion gap: 11 (ref 5–15)
Anion gap: 14 (ref 5–15)
BUN: 5 mg/dL — ABNORMAL LOW (ref 6–20)
BUN: <5 mg/dL — ABNORMAL LOW (ref 6–20)
BUN: <5 mg/dL — ABNORMAL LOW (ref 6–20)
CO2: 19 mmol/L — ABNORMAL LOW (ref 22–32)
CO2: 20 mmol/L — ABNORMAL LOW (ref 22–32)
CO2: 21 mmol/L — ABNORMAL LOW (ref 22–32)
Calcium: 7.6 mg/dL — ABNORMAL LOW (ref 8.9–10.3)
Calcium: 7.9 mg/dL — ABNORMAL LOW (ref 8.9–10.3)
Calcium: 8.1 mg/dL — ABNORMAL LOW (ref 8.9–10.3)
Chloride: 101 mmol/L (ref 98–111)
Chloride: 106 mmol/L (ref 98–111)
Chloride: 106 mmol/L (ref 98–111)
Creatinine, Ser: 0.65 mg/dL (ref 0.44–1.00)
Creatinine, Ser: 0.66 mg/dL (ref 0.44–1.00)
Creatinine, Ser: 0.76 mg/dL (ref 0.44–1.00)
GFR, Estimated: >60 mL/min (ref >60)
GFR, Estimated: >60 mL/min (ref >60)
GFR, Estimated: >60 mL/min (ref >60)
Glucose, Bld: 141 mg/dL — ABNORMAL HIGH (ref 70–99)
Glucose, Bld: 178 mg/dL — ABNORMAL HIGH (ref 70–99)
Glucose, Bld: 245 mg/dL — ABNORMAL HIGH (ref 70–99)
Potassium: 3.3 mmol/L — ABNORMAL LOW (ref 3.5–5.1)
Potassium: 3.4 mmol/L — ABNORMAL LOW (ref 3.5–5.1)
Potassium: 3.9 mmol/L (ref 3.5–5.1)
Sodium: 135 mmol/L (ref 135–145)
Sodium: 136 mmol/L (ref 135–145)
Sodium: 137 mmol/L (ref 135–145)

## 2024-01-24 LAB — HSV 1/2 PCR (SURFACE)
HSV-1 DNA: NOT DETECTED
HSV-2 DNA: DETECTED — AB

## 2024-01-24 LAB — CBC
HCT: 33.1 % — ABNORMAL LOW (ref 36.0–46.0)
Hemoglobin: 12.3 g/dL (ref 12.0–15.0)
MCH: 29.6 pg (ref 26.0–34.0)
MCHC: 37.2 g/dL — ABNORMAL HIGH (ref 30.0–36.0)
MCV: 79.6 fL — ABNORMAL LOW (ref 80.0–100.0)
Platelets: 176 K/uL (ref 150–400)
RBC: 4.16 MIL/uL (ref 3.87–5.11)
RDW: 13.6 % (ref 11.5–15.5)
WBC: 7.3 K/uL (ref 4.0–10.5)
nRBC: 0 % (ref 0.0–0.2)

## 2024-01-24 LAB — COMPREHENSIVE METABOLIC PANEL WITH GFR
ALT: 73 U/L — ABNORMAL HIGH (ref 0–44)
AST: 57 U/L — ABNORMAL HIGH (ref 15–41)
Albumin: 3 g/dL — ABNORMAL LOW (ref 3.5–5.0)
Alkaline Phosphatase: 43 U/L (ref 38–126)
Anion gap: 14 (ref 5–15)
BUN: <5 mg/dL — ABNORMAL LOW (ref 6–20)
CO2: 19 mmol/L — ABNORMAL LOW (ref 22–32)
Calcium: 7.7 mg/dL — ABNORMAL LOW (ref 8.9–10.3)
Chloride: 102 mmol/L (ref 98–111)
Creatinine, Ser: 0.72 mg/dL (ref 0.44–1.00)
GFR, Estimated: >60 mL/min (ref >60)
Glucose, Bld: 260 mg/dL — ABNORMAL HIGH (ref 70–99)
Potassium: 3.1 mmol/L — ABNORMAL LOW (ref 3.5–5.1)
Sodium: 135 mmol/L (ref 135–145)
Total Bilirubin: 0.5 mg/dL (ref 0.0–1.2)
Total Protein: 5.7 g/dL — ABNORMAL LOW (ref 6.5–8.1)

## 2024-01-24 LAB — HEPATITIS PANEL, ACUTE
HCV Ab: NONREACTIVE
Hep A IgM: NONREACTIVE
Hep B C IgM: NONREACTIVE
Hepatitis B Surface Ag: NONREACTIVE

## 2024-01-24 LAB — BLOOD GAS, VENOUS
Acid-base deficit: 3.6 mmol/L — ABNORMAL HIGH (ref 0.0–2.0)
Bicarbonate: 20.6 mmol/L (ref 20.0–28.0)
O2 Saturation: 91.7 %
Patient temperature: 36.8
pCO2, Ven: 34 mmHg — ABNORMAL LOW (ref 44–60)
pH, Ven: 7.39 (ref 7.25–7.43)
pO2, Ven: 62 mmHg — ABNORMAL HIGH (ref 32–45)

## 2024-01-24 LAB — BETA-HYDROXYBUTYRIC ACID
Beta-Hydroxybutyric Acid: 1.39 mmol/L — ABNORMAL HIGH (ref 0.05–0.27)
Beta-Hydroxybutyric Acid: 1.84 mmol/L — ABNORMAL HIGH (ref 0.05–0.27)
Beta-Hydroxybutyric Acid: 0.08 mmol/L (ref 0.05–0.27)
Beta-Hydroxybutyric Acid: 0.17 mmol/L (ref 0.05–0.27)

## 2024-01-24 LAB — MAGNESIUM: Magnesium: 1.5 mg/dL — ABNORMAL LOW (ref 1.7–2.4)

## 2024-01-24 LAB — PROCALCITONIN: Procalcitonin: <0.1 ng/mL

## 2024-01-24 MED ORDER — INSULIN GLARGINE-YFGN 100 UNIT/ML ~~LOC~~ SOLN
20.0000 [IU] | SUBCUTANEOUS | Status: DC
  Administered 2024-01-24: 20 [IU] via SUBCUTANEOUS
  Filled 2024-01-24 (×2): qty 0.2

## 2024-01-24 MED ORDER — SODIUM CHLORIDE 0.9 % IV SOLN
2.0000 g | Freq: Three times a day (TID) | INTRAVENOUS | Status: DC
  Administered 2024-01-24 – 2024-01-27 (×10): 2 g via INTRAVENOUS
  Filled 2024-01-24 (×11): qty 12.5

## 2024-01-24 MED ORDER — LACTATED RINGERS IV BOLUS
1000.0000 mL | Freq: Once | INTRAVENOUS | Status: AC
  Administered 2024-01-24: 1000 mL via INTRAVENOUS

## 2024-01-24 MED ORDER — INSULIN ASPART 100 UNIT/ML IJ SOLN: 0.0000 [IU] | Freq: Every day | INTRAMUSCULAR | Status: DC

## 2024-01-24 MED ORDER — VALACYCLOVIR HCL 500 MG PO TABS
1000.0000 mg | ORAL_TABLET | Freq: Two times a day (BID) | ORAL | Status: DC
  Administered 2024-01-24 – 2024-01-27 (×5): 1000 mg via ORAL
  Filled 2024-01-24 (×5): qty 2

## 2024-01-24 MED ORDER — POTASSIUM CHLORIDE CRYS ER 20 MEQ PO TBCR
40.0000 meq | EXTENDED_RELEASE_TABLET | Freq: Once | ORAL | Status: AC
  Administered 2024-01-24: 40 meq via ORAL
  Filled 2024-01-24: qty 2

## 2024-01-24 MED ORDER — ONDANSETRON HCL 4 MG/2ML IJ SOLN
4.0000 mg | Freq: Four times a day (QID) | INTRAMUSCULAR | Status: DC | PRN
  Administered 2024-01-24 – 2024-01-26 (×4): 4 mg via INTRAVENOUS
  Filled 2024-01-24 (×3): qty 2

## 2024-01-24 MED ORDER — INSULIN ASPART 100 UNIT/ML IJ SOLN
0.0000 [IU] | Freq: Three times a day (TID) | INTRAMUSCULAR | Status: DC
  Administered 2024-01-24 (×2): 2 [IU] via SUBCUTANEOUS

## 2024-01-24 MED ORDER — INSULIN ASPART 100 UNIT/ML IJ SOLN: 0.0000 [IU] | INTRAMUSCULAR | Status: DC

## 2024-01-24 MED ORDER — INSULIN GLARGINE-YFGN 100 UNIT/ML ~~LOC~~ SOLN
40.0000 [IU] | SUBCUTANEOUS | Status: DC
  Filled 2024-01-24: qty 0.4

## 2024-01-24 MED ORDER — POTASSIUM CHLORIDE CRYS ER 20 MEQ PO TBCR: 40.0000 meq | EXTENDED_RELEASE_TABLET | Freq: Once | ORAL | Status: DC

## 2024-01-24 MED ORDER — METRONIDAZOLE 500 MG/100ML IV SOLN
500.0000 mg | Freq: Two times a day (BID) | INTRAVENOUS | Status: DC
  Administered 2024-01-24: 500 mg via INTRAVENOUS
  Filled 2024-01-24: qty 100

## 2024-01-24 MED ORDER — POTASSIUM CHLORIDE 10 MEQ/100ML IV SOLN
10.0000 meq | INTRAVENOUS | Status: AC
  Filled 2024-01-24: qty 100

## 2024-01-24 MED ORDER — INSULIN REGULAR(HUMAN) IN NACL 100-0.9 UT/100ML-% IV SOLN
INTRAVENOUS | Status: DC
  Administered 2024-01-24: 11.5 [IU]/h via INTRAVENOUS
  Filled 2024-01-24: qty 100

## 2024-01-24 MED ORDER — VANCOMYCIN HCL IN DEXTROSE 750-5 MG/150ML-% IV SOLN
750.0000 mg | Freq: Two times a day (BID) | INTRAVENOUS | Status: DC
  Filled 2024-01-24 (×3): qty 150

## 2024-01-24 MED ORDER — LACTATED RINGERS IV SOLN: INTRAVENOUS | Status: AC

## 2024-01-24 MED ORDER — POTASSIUM CHLORIDE 10 MEQ/100ML IV SOLN
10.0000 meq | INTRAVENOUS | Status: AC
  Administered 2024-01-24 (×2): 10 meq via INTRAVENOUS
  Filled 2024-01-24: qty 100

## 2024-01-24 MED ORDER — INSULIN ASPART 100 UNIT/ML IJ SOLN: 0.0000 [IU] | Freq: Three times a day (TID) | INTRAMUSCULAR | Status: DC

## 2024-01-24 MED ORDER — MAGNESIUM SULFATE IN D5W 1-5 GM/100ML-% IV SOLN
1.0000 g | Freq: Once | INTRAVENOUS | Status: AC
  Administered 2024-01-24: 1 g via INTRAVENOUS
  Filled 2024-01-24: qty 100

## 2024-01-24 MED ORDER — VANCOMYCIN HCL 1000 MG IV SOLR
750.0000 mg | Freq: Two times a day (BID) | INTRAVENOUS | Status: DC
Start: 2024-01-24 — End: 2024-01-25
  Administered 2024-01-24 – 2024-01-25 (×3): 750 mg via INTRAVENOUS
  Filled 2024-01-24 (×4): qty 15

## 2024-01-24 MED ORDER — LACTATED RINGERS IV SOLN: INTRAVENOUS | Status: DC

## 2024-01-24 MED ORDER — DEXTROSE 50 % IV SOLN: 0.0000 mL | INTRAVENOUS | Status: DC | PRN

## 2024-01-24 MED ORDER — DEXTROSE IN LACTATED RINGERS 5 % IV SOLN: INTRAVENOUS | Status: DC

## 2024-01-24 NOTE — Consult Note (Signed)
 Medical Consultation   Allison Mathews  FMW:[id number]  DOB: 1990-09-11  DOA: 01/23/2024  PCP: Chrystal Lamarr RAMAN, MD   Requesting physician: Dr. Erik  History of Present Illness: Allison Mathews is an 33 y.o. female with past medical history of insulin -dependent type 2 diabetes, hypertension, hyperlipidemia, sickle cell trait, anemia, [id number] at [redacted]w[redacted]d presented to [facility]U for lab abnormalities noted by her PCP.  Patient reportedly seen at urgent care earlier this week for dysuria and urinary frequency.  They dropped her urine sample so did not test it but started on empiric antibiotics 2 days ago.  Patient saw her PCP yesterday for malaise and feeling hot.  She had outpatient labs done which showed hyperglycemia with elevated anion gap, elevated liver enzymes, and low potassium so she was sent to MICU.  On arrival to MICU, patient reported cramping in her legs, vaginal bleeding, lower abdominal cramping, decreased appetite, diarrhea, and malaise.  She was noted to be febrile with temperature 103 F, tachycardic to the 130s, and hypertensive with SBP in the 160s on arrival to MAU.  Sepsis protocol was activated but there was a delay in giving antibiotics/IV fluids due to difficulties obtaining IV access.  Despite this, vitals normalized with Tylenol  1 g alone.  Labs showing no leukocytosis or anemia, sodium 139, potassium 2.7, magnesium  1.5, chloride 95, bicarb 19, anion gap 17, glucose 227, beta hydroxybutyric acid 0.87, hemoglobin A1c 6.5, BUN <5, creatinine 0.72, calcium  8.2, albumin 3.5, AST 85, ALT 90, alk phos and T. bili normal, COVID/influenza/RSV PCR negative, lactic acid 1.6> 1.1, INR 1.1, Beta-hCG 2836.SABRA  UA with >500 glucose, 80 ketones, 100 protein, negative nitrite, small leukocytes, 21-50 RBCs, >50 WBCs, and rare bacteria.  Blood cultures in process. On pelvic exam done by OB/GYN, she was noted to have 2 vulvar ulcers that were painful.  Speculum exam with dark blood  in the vaginal vault but no active vaginal bleeding, cervix visually closed, uterus small and mobile with mild tenderness when prompted.  Wet prep with >10 WBCs.  GC chlamydia probe and HSV 1/2 PCR pending.  Ultrasound with 6 mm ([redacted]w[redacted]d) intrauterine gestational sac.  Patient was given broad-spectrum antibiotics including vancomycin, cefepime, and metronidazole due to concern for sepsis from unknown source.  There was potential reaction to vancomycin during infusion and it was restarted at a lower rate after discussion with pharmacy.  Patient was given p.o. Benadryl .  Other medications administered at MAU include p.o. labetalol, oral and IV potassium, and 1 L LR.  TRH consulted due to concern for sepsis, electrolyte derangements, elevated LFTs, and possible DKA.  Patient is currently resting comfortably.  Complaining of multiple episodes of non-bloody diarrhea since yesterday.  Denies vomiting or abdominal pain.  Denies fevers, cough, or shortness of breath.  She takes Tresiba  42 units every evening and sliding scale insulin  with meals.  Patient states she missed her evening dose of Tresiba  tonight.  No other complaints.  Review of Systems:  Review of Systems  All other systems reviewed and are negative.  Past Medical History: Past Medical History:  Diagnosis Date   Anemia    FeSO4 taken   Diabetes type 2 (HCC) 2022   Heart murmur    Hypercholesteremia    Hypertension    Scoliosis    Sickle cell trait    Yeast infection    NOT FREQUENT    Past Surgical History: Past Surgical History:  Procedure Laterality Date   NO PAST SURGERIES       Allergies:  No Known Allergies   Social History:  reports that she has never smoked. She has never used smokeless tobacco. She reports that she does not drink alcohol and does not use drugs.   Family History: Family History  Problem Relation Age of Onset   Diabetes Father    Hypertension Paternal Aunt    Diabetes Paternal Grandmother     Anesthesia problems Neg Hx    Hypotension Neg Hx    Malignant hyperthermia Neg Hx    Pseudochol deficiency Neg Hx     Physical Exam: Vitals:   01/23/24 2331 01/23/24 2346 01/24/24 0000 01/24/24 0001  BP: 120/66 122/73  129/71  Pulse: 89 94  97  Resp:      Temp:      TempSrc:      SpO2:   100%   Weight:        Physical Exam Vitals reviewed.  Constitutional:      General: She is not in acute distress. HENT:     Head: Normocephalic and atraumatic.  Eyes:     Extraocular Movements: Extraocular movements intact.  Cardiovascular:     Rate and Rhythm: Normal rate and regular rhythm.     Heart sounds: Normal heart sounds.  Pulmonary:     Effort: Pulmonary effort is normal. No respiratory distress.     Breath sounds: Normal breath sounds.  Abdominal:     General: Bowel sounds are normal.     Palpations: Abdomen is soft.     Tenderness: There is no abdominal tenderness. There is no guarding.  Musculoskeletal:     Cervical back: Normal range of motion.     Right lower leg: No edema.     Left lower leg: No edema.  Skin:    General: Skin is warm and dry.  Neurological:     General: No focal deficit present.     Mental Status: She is alert and oriented to person, place, and time.      Data reviewed:  I have personally reviewed following labs and imaging studies Labs:  CBC: Recent Labs  Lab 01/23/24 2018  WBC 8.7  NEUTROABS 6.9  HGB 13.7  HCT 36.8  MCV 79.0*  PLT 168    Basic Metabolic Panel: Recent Labs  Lab 01/23/24 2018  NA 131*  K 2.7*  CL 95*  CO2 19*  GLUCOSE 227*  BUN <5*  CREATININE 0.72  CALCIUM  8.2*  MG 1.5*   GFR Estimated Creatinine Clearance: 85.8 mL/min (by C-G formula based on SCr of 0.72 mg/dL). Liver Function Tests: Recent Labs  Lab 01/23/24 2018  AST 85*  ALT 90*  ALKPHOS 52  BILITOT 0.4  PROT 6.7  ALBUMIN 3.5   No results for input(s): LIPASE, AMYLASE in the last 168 hours. No results for input(s): AMMONIA in the last  168 hours. Coagulation profile Recent Labs  Lab 01/23/24 2018  INR 1.1    Cardiac Enzymes: No results for input(s): CKTOTAL, CKMB, CKMBINDEX, TROPONINI in the last 168 hours. BNP: Invalid input(s): POCBNP CBG: Recent Labs  Lab 01/23/24 1901 01/23/24 2227  GLUCAP 229* 243*   D-Dimer No results for input(s): DDIMER in the last 72 hours. Hgb A1c Recent Labs    01/23/24 2018  HGBA1C 6.5*   Lipid Profile No results for input(s): CHOL, HDL, LDLCALC, TRIG, CHOLHDL, LDLDIRECT in the last 72 hours. Thyroid  function studies No results for input(s):  TSH, T4TOTAL, T3FREE, THYROIDAB in the last 72 hours.  Invalid input(s): FREET3 Anemia work up No results for input(s): VITAMINB12, FOLATE, FERRITIN, TIBC, IRON, RETICCTPCT in the last 72 hours. Urinalysis    Component Value Date/Time   COLORURINE YELLOW 01/23/2024 1910   APPEARANCEUR HAZY (A) 01/23/2024 1910   APPEARANCEUR Clear 11/26/2021 0048   LABSPEC 1.022 01/23/2024 1910   PHURINE 6.0 01/23/2024 1910   GLUCOSEU >=500 (A) 01/23/2024 1910   HGBUR SMALL (A) 01/23/2024 1910   BILIRUBINUR NEGATIVE 01/23/2024 1910   BILIRUBINUR Negative 11/26/2021 1730   BILIRUBINUR Negative 11/26/2021 0048   KETONESUR 80 (A) 01/23/2024 1910   PROTEINUR 100 (A) 01/23/2024 1910   UROBILINOGEN 0.2 11/26/2021 1730   UROBILINOGEN 0.2 04/11/2014 1733   NITRITE NEGATIVE 01/23/2024 1910   LEUKOCYTESUR SMALL (A) 01/23/2024 1910     Microbiology Recent Results (from the past 240 hours)  Resp panel by RT-PCR (RSV, Flu A&B, Covid)     Status: None   Collection Time: 01/23/24  7:51 PM   Specimen: Nasal Swab  Result Value Ref Range Status   SARS Coronavirus 2 by RT PCR NEGATIVE NEGATIVE Final   Influenza A by PCR NEGATIVE NEGATIVE Final   Influenza B by PCR NEGATIVE NEGATIVE Final    Comment: (NOTE) The Xpert Xpress SARS-CoV-2/FLU/RSV plus assay is intended as an aid in the diagnosis of influenza  from Nasopharyngeal swab specimens and should not be used as a sole basis for treatment. Nasal washings and aspirates are unacceptable for Xpert Xpress SARS-CoV-2/FLU/RSV testing.  Fact Sheet for Patients: BloggerCourse.com  Fact Sheet for Healthcare Providers: SeriousBroker.it  This test is not yet approved or cleared by the [place]  FDA and has been authorized for detection and/or diagnosis of SARS-CoV-2 by FDA under an Emergency Use Authorization (EUA). This EUA will remain in effect (meaning this test can be used) for the duration of the COVID-19 declaration under Section 564(b)(1) of the Act, 21 U.S.C. section 360bbb-3(b)(1), unless the authorization is terminated or revoked.     Resp Syncytial Virus by PCR NEGATIVE NEGATIVE Final    Comment: (NOTE) Fact Sheet for Patients: BloggerCourse.com  Fact Sheet for Healthcare Providers: SeriousBroker.it  This test is not yet approved or cleared by the [place]  FDA and has been authorized for detection and/or diagnosis of SARS-CoV-2 by FDA under an Emergency Use Authorization (EUA). This EUA will remain in effect (meaning this test can be used) for the duration of the COVID-19 declaration under Section 564(b)(1) of the Act, 21 U.S.C. section 360bbb-3(b)(1), unless the authorization is terminated or revoked.  Performed at [facility], [address] [place], [place] [address]   Wet prep, genital     Status: Abnormal   Collection Time: 01/23/24  7:51 PM  Result Value Ref Range Status   Yeast Wet Prep HPF POC NONE SEEN NONE SEEN Final   Trich, Wet Prep NONE SEEN NONE SEEN Final   Clue Cells Wet Prep HPF POC NONE SEEN NONE SEEN Final   WBC, Wet Prep HPF POC >=10 (A) <10 Final   Sperm NONE SEEN  Final    Comment: Performed at [facility], [address] [place], [place] [address]       Inpatient  Medications:   Scheduled Meds:  docusate sodium   100 mg Oral Daily   labetalol  200 mg Oral BID   prenatal multivitamin  1 tablet Oral Q1200   Continuous Infusions:  ceFEPime (MAXIPIME) IV  lactated ringers      lactated ringers      metronidazole     potassium chloride  10 mEq (01/23/24 2354)   vancomycin       Radiological Exams on Admission: US  OB LESS THAN 14 WEEKS WITH OB TRANSVAGINAL Result Date: 01/23/2024 EXAM: OBSTETRIC ULTRASOUND FIRST TRIMESTER TECHNIQUE: Transvaginal first trimester obstetric pelvic duplex ultrasound was performed with real-time imaging, color flow Doppler imaging, and spectral analysis. COMPARISON: CT abdomen and pelvis 04/07/2022. CLINICAL HISTORY: Pregnancy, location unknown [phone]. last menstrual: 12/11/2023. Gestational age by last menstrual: 6 weeks and 1 day. Estimated due date: 09/16/2024 FINDINGS: UTERUS: 3.1 x 2.9 x 2.7 cm left uterine fibroid. GESTATIONAL SAC(S): Single intrauterine gestational sac within the endometrium. No subchorionic hemorrhage. YOLK SAC: No yolk sac. EMBRYO(<11WK) /FETUS(>=11WK): No embryo. CROWN RUMP LENGTH: Not measured. RIGHT OVARY: Unremarkable. Normal arterial and venous flow. LEFT OVARY: Unremarkable. Normal arterial and venous flow. FREE FLUID: No free fluid. MEASUREMENTS ESTIMATED GESTATIONAL AGE BY CURRENT ULTRASOUND: 5 weeks and 2 days gestation (based on mean sac diameter of 6 mm). IMPRESSION: 1. Probable early intrauterine gestational sac, but no yolk sac, fetal pole, or cardiac activity yet visualized. Recommend follow-up quantitative B-HCG levels and follow-up US  in 14 days to assess viability. This recommendation follows SRU consensus guidelines: Diagnostic Criteria for Nonviable Pregnancy Early in the First Trimester. LOISE Diedra PARAS Med 2013; [id number]:8556-48. 2. Left uterine fibroid measuring 3.1 x 2.9 x 2.7 cm. Electronically signed by: Morgane Naveau MD 01/23/2024 09:06 PM EDT RP Workstation: [id number]     Impression/Recommendations  Fever SIRS vs ?sepsis Presented to MAU febrile with temperature 103 F and tachycardic to the 130s. Sepsis protocol was activated but there was a delay in giving antibiotics/IV fluids due to difficulties obtaining IV access.  Despite this, vitals normalized after Tylenol  alone.  CBC repeated twice and showing no leukocytosis.  Lactate normal x 2.  No hypotension.  Exact source of infection unclear at this time.  Patient is not endorsing any respiratory symptoms.  COVID/influenza/RSV PCR negative.  No meningeal signs.  UA with negative nitrite, small leukocytes, 21-50 RBCs, >50 WBCs, and rare bacteria.  He does endorse diarrhea since yesterday but no abdominal pain.  Differential diagnosis includes UTI, gastroenteritis, skin/soft tissue infection of vulva, or septic abortion.  Given no leukocytosis/lactic acidosis and rapid improvement with Tylenol  alone, OB/GYN not planning on D&E at this time although low threshold to proceed with surgical management if clinical status changes.  Continue broad-spectrum antibiotics including vancomycin, cefepime, and Flagyl.  There was concern for potential reaction to vancomycin during infusion and OB had discussed with pharmacist who agreed with restarting at a lower rate.  Continue IV fluid hydration. Follow-up blood cultures, urine culture, GC/CT, HSV testing, C. difficile PCR, GI pathogen panel.  Placed on enteric precautions.  Trend WBC count and check procalcitonin level.  Hyperglycemia/possible early DKA Insulin -dependent type 2 diabetes Initial labs showing glucose 227, bicarb 19, anion gap 17, beta hydroxybutyric acid 0.87.  Hemoglobin A1c 6.5.  After IV fluids, she continues to be hyperglycemic with glucose now up to 287 and repeat beta hydroxybutyrate acid level has increased to 1.39.  Potassium level has improved on repeat labs.  Keep NPO.  Placed on insulin  drip and IV fluids per DKA protocol.  Monitor BMP and beta  hydroxybutyric acid level every 4 hours.  Continue to replace potassium and monitor level closely.  VBG ordered to check pH.  Hypokalemia and hypomagnesemia EKG without significant change/QT prolongation.  Monitor electrolytes closely and  continue to replace as needed.  Elevated transaminases AST 85, ALT 90, alk phos and T. bili normal.  No abdominal pain or tenderness.  Acute hepatitis panel and right upper quadrant ultrasound ordered.  Monitor LFTs.  Mild thrombocytopenia Platelet count 168k on initial labs in 132k on repeat.  Monitor platelet count.  Hypertension Currently normotensive.  Continue labetalol.  Pregnancy Management per primary team.  Thank you for this consultation.  Our [facility] hospitalist team will follow the patient with you.  Time Spent: 90 minutes  Editha Ram M.D. [facility] 01/24/2024, 12:34 AM

## 2024-01-24 NOTE — Progress Notes (Signed)
 Pt seen by Dr Alfornia earlier this am, please see note for details.    Allison Mathews is an 33 y.o. female with past medical history of insulin -dependent type 2 diabetes, hypertension, hyperlipidemia, sickle cell trait, anemia, G4P2012 at [redacted]w[redacted]d presented to MAU for lab abnormalities noted by her PCP.  Patient reportedly seen at urgent care earlier this week for dysuria and urinary frequency.  They dropped her urine sample so did not test it but started on empiric antibiotics 2 days ago.  Patient saw her PCP yesterday for malaise and feeling hot.  She had outpatient labs done which showed hyperglycemia with elevated anion gap, elevated liver enzymes, and low potassium so she was sent to MICU.    She was placed on DKA Order set later on transitioned to insulin  sq.    Continue to monitor.   Dorina Ribaudo, MD

## 2024-01-24 NOTE — Progress Notes (Signed)
 FACULTY PRACTICE ANTEPARTUM COMPREHENSIVE PROGRESS NOTE  Allison Mathews is a 33 y.o. [phone] at [redacted]w[redacted]d who is admitted for fever with uncertain etiology, T2DM with hyperglycemia and concern for DKA, and electrolyte derangements  Length of Stay:  1 Days. Admitted 01/23/2024  Subjective: Feeling tired this morning, did not get much rest last night with all the labs/testing being collected. Reports all of her pain including lower abdominal, back, and leg pain has resolved.  Patient reports +GBS on urine culture 10/15   Vitals:  Blood pressure 119/68, pulse 87, temperature 98.2 F (36.8 C), temperature source Oral, resp. rate 20, weight 73.6 kg, last menstrual period 12/11/2023, SpO2 98%. Physical Examination: GENERAL: A&Ox3, no acute distress, resting comfortably in bed CARDIOVASCULAR: Normal heart rate noted, regular rhythm RESPIRATORY: Effort normal, no problems with respiration noted ABDOMEN: Soft, nontender, non-distended  Results for orders placed or performed during the hospital encounter of 01/23/24 (from the past 48 hours)  Glucose, capillary     Status: Abnormal   Collection Time: 01/23/24  7:01 PM  Result Value Ref Range   Glucose-Capillary 229 (H) 70 - 99 mg/dL    Comment: Glucose reference range applies only to samples taken after fasting for at least 8 hours.  Urinalysis, Routine w reflex microscopic -Urine, Clean Catch     Status: Abnormal   Collection Time: 01/23/24  7:10 PM  Result Value Ref Range   Color, Urine YELLOW YELLOW   APPearance HAZY (A) CLEAR   Specific Gravity, Urine 1.022 1.005 - 1.030   pH 6.0 5.0 - 8.0   Glucose, UA >=500 (A) NEGATIVE mg/dL   Hgb urine dipstick SMALL (A) NEGATIVE   Bilirubin Urine NEGATIVE NEGATIVE   Ketones, ur 80 (A) NEGATIVE mg/dL   Protein, ur 899 (A) NEGATIVE mg/dL   Nitrite NEGATIVE NEGATIVE   Leukocytes,Ua SMALL (A) NEGATIVE   RBC / HPF 21-50 0 - 5 RBC/hpf   WBC, UA >50 0 - 5 WBC/hpf   Bacteria, UA RARE (A) NONE SEEN    Squamous Epithelial / HPF 0-5 0 - 5 /HPF   Mucus PRESENT     Comment: Performed at [facility], [address], [place], [place] [address]  Pregnancy, urine POC     Status: Abnormal   Collection Time: 01/23/24  7:14 PM  Result Value Ref Range   Preg Test, Ur POSITIVE (A) NEGATIVE    Comment:        THE SENSITIVITY OF THIS METHODOLOGY IS >20 mIU/mL.   Resp panel by RT-PCR (RSV, Flu A&B, Covid)     Status: None   Collection Time: 01/23/24  7:51 PM   Specimen: Nasal Swab  Result Value Ref Range   SARS Coronavirus 2 by RT PCR NEGATIVE NEGATIVE   Influenza A by PCR NEGATIVE NEGATIVE   Influenza B by PCR NEGATIVE NEGATIVE    Comment: (NOTE) The Xpert Xpress SARS-CoV-2/FLU/RSV plus assay is intended as an aid in the diagnosis of influenza from Nasopharyngeal swab specimens and should not be used as a sole basis for treatment. Nasal washings and aspirates are unacceptable for Xpert Xpress SARS-CoV-2/FLU/RSV testing.  Fact Sheet for Patients: BloggerCourse.com  Fact Sheet for Healthcare Providers: SeriousBroker.it  This test is not yet approved or cleared by the [place]  FDA and has been authorized for detection and/or diagnosis of SARS-CoV-2 by FDA under an Emergency Use Authorization (EUA). This EUA will remain in effect (meaning this test can be used) for the duration of the COVID-19 declaration under  Section 564(b)(1) of the Act, 21 U.S.C. section 360bbb-3(b)(1), unless the authorization is terminated or revoked.     Resp Syncytial Virus by PCR NEGATIVE NEGATIVE    Comment: (NOTE) Fact Sheet for Patients: BloggerCourse.com  Fact Sheet for Healthcare Providers: SeriousBroker.it  This test is not yet approved or cleared by the [place]  FDA and has been authorized for detection and/or diagnosis of SARS-CoV-2 by FDA under an Emergency Use Authorization (EUA).  This EUA will remain in effect (meaning this test can be used) for the duration of the COVID-19 declaration under Section 564(b)(1) of the Act, 21 U.S.C. section 360bbb-3(b)(1), unless the authorization is terminated or revoked.  Performed at [facility], [address], [place], [place] [address]   Wet prep, genital     Status: Abnormal   Collection Time: 01/23/24  7:51 PM  Result Value Ref Range   Yeast Wet Prep HPF POC NONE SEEN NONE SEEN   Trich, Wet Prep NONE SEEN NONE SEEN   Clue Cells Wet Prep HPF POC NONE SEEN NONE SEEN   WBC, Wet Prep HPF POC >=10 (A) <10   Sperm NONE SEEN     Comment: Performed at [facility], [address] [place], [place] [address]  Osmolality     Status: None   Collection Time: 01/23/24  8:16 PM  Result Value Ref Range   Osmolality 282 275 - 295 mOsm/kg    Comment: Performed at [facility], [address], [place], [place] [address]  Beta-hydroxybutyric acid     Status: Abnormal   Collection Time: 01/23/24  8:16 PM  Result Value Ref Range   Beta-Hydroxybutyric Acid 0.87 (H) 0.05 - 0.27 mmol/L    Comment: Performed at [facility], [address] [place], [place] [address]  CBC with Differential/Platelet     Status: Abnormal   Collection Time: 01/23/24  8:18 PM  Result Value Ref Range   WBC 8.7 4.0 - 10.5 K/uL   RBC 4.66 3.87 - 5.11 MIL/uL   Hemoglobin 13.7 12.0 - 15.0 g/dL   HCT 63.1 63.9 - 53.9 %   MCV 79.0 (L) 80.0 - 100.0 fL   MCH 29.4 26.0 - 34.0 pg   MCHC 37.2 (H) 30.0 - 36.0 g/dL   RDW 86.2 88.4 - 84.4 %   Platelets 168 150 - 400 K/uL   nRBC 0.0 0.0 - 0.2 %   Neutrophils Relative % 79 %   Neutro Abs 6.9 1.7 - 7.7 K/uL   Lymphocytes Relative 10 %   Lymphs Abs 0.9 0.7 - 4.0 K/uL   Monocytes Relative 10 %   Monocytes Absolute 0.8 0.1 - 1.0 K/uL   Eosinophils Relative 0 %   Eosinophils Absolute 0.0 0.0 - 0.5 K/uL   Basophils Relative 0 %   Basophils Absolute 0.0 0.0 - 0.1 K/uL   Immature Granulocytes 1 %    Abs Immature Granulocytes 0.05 0.00 - 0.07 K/uL    Comment: Performed at [facility], [address] [place], [place] [address]  Comprehensive metabolic panel     Status: Abnormal   Collection Time: 01/23/24  8:18 PM  Result Value Ref Range   Sodium 131 (L) 135 - 145 mmol/L   Potassium 2.7 (LL) 3.5 - 5.1 mmol/L    Comment: CRITICAL RESULT CALLED TO, READ BACK BY AND VERIFIED WITH A,BURCH RN @2109  01/23/24 E,[place]ENT[place]   Chloride 95 (L) 98 - 111 mmol/L   CO2 19 (L)  22 - 32 mmol/L   Glucose, Bld 227 (H) 70 - 99 mg/dL    Comment: Glucose reference range applies only to samples taken after fasting for at least 8 hours.   BUN <5 (L) 6 - 20 mg/dL   Creatinine, Ser 9.27 0.44 - 1.00 mg/dL   Calcium  8.2 (L) 8.9 - 10.3 mg/dL   Total Protein 6.7 6.5 - 8.1 g/dL   Albumin 3.5 3.5 - 5.0 g/dL   AST 85 (H) 15 - 41 U/L   ALT 90 (H) 0 - 44 U/L   Alkaline Phosphatase 52 38 - 126 U/L   Total Bilirubin 0.4 0.0 - 1.2 mg/dL   GFR, Estimated >39 >39 mL/min    Comment: (NOTE) Calculated using the CKD-EPI Creatinine Equation (2021)    Anion gap 17 (H) 5 - 15    Comment: Performed at [facility], [address] [place], [place] [address]  Magnesium      Status: Abnormal   Collection Time: 01/23/24  8:18 PM  Result Value Ref Range   Magnesium  1.5 (L) 1.7 - 2.4 mg/dL    Comment: Performed at [facility], [address] [place], [place] [address]  Lactic acid, plasma     Status: None   Collection Time: 01/23/24  8:18 PM  Result Value Ref Range   Lactic Acid, Venous 1.6 0.5 - 1.9 mmol/L    Comment: Performed at [facility], [address], [place], [place] [address]  Protime-INR     Status: None   Collection Time: 01/23/24  8:18 PM  Result Value Ref Range   Prothrombin Time 14.4 11.4 - 15.2 seconds   INR 1.1 0.8 - 1.2    Comment: (NOTE) INR goal varies based on device and disease states. Performed at [facility], [address], [place], [place] [address]   APTT      Status: None   Collection Time: 01/23/24  8:18 PM  Result Value Ref Range   aPTT 31 24 - 36 seconds    Comment: Performed at [facility], [address] [place], [place] [address]  Hemoglobin A1c     Status: Abnormal   Collection Time: 01/23/24  8:18 PM  Result Value Ref Range   Hgb A1c MFr Bld 6.5 (H) 4.8 - 5.6 %    Comment: (NOTE) Diagnosis of Diabetes The following HbA1c ranges recommended by the American Diabetes Association (ADA) may be used as an aid in the diagnosis of diabetes mellitus.  Hemoglobin             Suggested A1C NGSP%              Diagnosis  <5.7                   Non Diabetic  5.7-6.4                Pre-Diabetic  >6.4                   Diabetic  <7.0                   Glycemic control for                       adults with diabetes.     Mean Plasma Glucose 139.85 mg/dL    Comment: Performed at [facility], [address], [place], [place] [address]  hCG, quantitative, pregnancy     Status: Abnormal   Collection Time: 01/23/24  8:18 PM  Result Value Ref Range   hCG, Beta Chain, Quant, S 2,836 (H) <5 mIU/mL    Comment:          GEST. AGE      CONC.  (mIU/mL)   <=1 WEEK        5 - 50     2 WEEKS       50 - 500     3 WEEKS       100 - 10,000     4 WEEKS     1,000 - 30,000     5 WEEKS     3,500 - 115,000   6-8 WEEKS     12,000 - 270,000    12 WEEKS     15,000 - 220,000        FEMALE AND NON-PREGNANT FEMALE:     LESS THAN 5 mIU/mL Performed at [facility], [address], [place], [place] [address]   Glucose, capillary     Status: Abnormal   Collection Time: 01/23/24 10:27 PM  Result Value Ref Range   Glucose-Capillary 243 (H) 70 - 99 mg/dL    Comment: Glucose reference range applies only to samples taken after fasting for at least 8 hours.  Lactic acid, plasma     Status: None   Collection Time: 01/23/24 10:38 PM  Result Value Ref Range   Lactic Acid, Venous 1.1 0.5 - 1.9 mmol/L    Comment: Performed at [facility]  Lab, [address] [place], [place] [address]  Basic metabolic panel     Status: Abnormal   Collection Time: 01/24/24 12:20 AM  Result Value Ref Range   Sodium 135 135 - 145 mmol/L   Potassium 3.4 (L) 3.5 - 5.1 mmol/L    Comment: HEMOLYSIS AT THIS LEVEL MAY AFFECT RESULT DELTA CHECK NOTED    Chloride 101 98 - 111 mmol/L   CO2 20 (L) 22 - 32 mmol/L   Glucose, Bld 245 (H) 70 - 99 mg/dL    Comment: Glucose reference range applies only to samples taken after fasting for at least 8 hours.   BUN 5 (L) 6 - 20 mg/dL   Creatinine, Ser 9.23 0.44 - 1.00 mg/dL   Calcium  7.9 (L) 8.9 - 10.3 mg/dL   GFR, Estimated >39 >39 mL/min    Comment: (NOTE) Calculated using the CKD-EPI Creatinine Equation (2021)    Anion gap 14 5 - 15    Comment: Performed at [facility], [address] [place], [place] [address]  CBC with Differential/Platelet     Status: Abnormal   Collection Time: 01/24/24 12:20 AM  Result Value Ref Range   WBC 9.1 4.0 - 10.5 K/uL   RBC 4.46 3.87 - 5.11 MIL/uL   Hemoglobin 13.3 12.0 - 15.0 g/dL   HCT 64.2 (L) 63.9 - 53.9 %   MCV 80.0 80.0 - 100.0 fL   MCH 29.8 26.0 - 34.0 pg   MCHC 37.3 (H) 30.0 - 36.0 g/dL   RDW 86.1 88.4 - 84.4 %   Platelets 132 (L) 150 - 400 K/uL    Comment: REPEATED TO VERIFY PLATELET COUNT CONFIRMED BY SMEAR    nRBC 0.0 0.0 - 0.2 %   Neutrophils Relative % 77 %   Neutro Abs 7.0 1.7 - 7.7 K/uL   Lymphocytes Relative 12 %   Lymphs Abs 1.1 0.7 -  4.0 K/uL   Monocytes Relative 10 %   Monocytes Absolute 0.9 0.1 - 1.0 K/uL   Eosinophils Relative 0 %   Eosinophils Absolute 0.0 0.0 - 0.5 K/uL   Basophils Relative 0 %   Basophils Absolute 0.0 0.0 - 0.1 K/uL   Immature Granulocytes 1 %   Abs Immature Granulocytes 0.06 0.00 - 0.07 K/uL    Comment: Performed at [facility], [address] [place], [place] [address]  Glucose, capillary     Status: Abnormal   Collection Time: 01/24/24  2:14 AM  Result Value Ref Range   Glucose-Capillary 287 (H) 70  - 99 mg/dL    Comment: Glucose reference range applies only to samples taken after fasting for at least 8 hours.  Beta-hydroxybutyric acid     Status: Abnormal   Collection Time: 01/24/24  2:15 AM  Result Value Ref Range   Beta-Hydroxybutyric Acid 1.39 (H) 0.05 - 0.27 mmol/L    Comment: Performed at [facility], [address], [place], [place] [address]  C Difficile Quick Screen w PCR reflex     Status: None   Collection Time: 01/24/24  3:52 AM   Specimen: Stool  Result Value Ref Range   C Diff antigen NEGATIVE NEGATIVE   C Diff toxin NEGATIVE NEGATIVE   C Diff interpretation No C. difficile detected.     Comment: Performed at [facility], [address], [place], [place] [address]  Blood gas, venous     Status: Abnormal   Collection Time: 01/24/24  4:02 AM  Result Value Ref Range   pH, Ven 7.39 7.25 - 7.43   pCO2, Ven 34 (L) 44 - 60 mmHg   pO2, Ven 62 (H) 32 - 45 mmHg   Bicarbonate 20.6 20.0 - 28.0 mmol/L   Acid-base deficit 3.6 (H) 0.0 - 2.0 mmol/L   O2 Saturation 91.7 %   Patient temperature 36.8    Collection site BLOOD RIGHT HAND    Drawn by DRAWN BY RN     Comment: Performed at [facility], [address] [place], [place] [address]  Procalcitonin     Status: None   Collection Time: 01/24/24  4:04 AM  Result Value Ref Range   Procalcitonin <0.10 ng/mL    Comment:        Interpretation: PCT (Procalcitonin) <= 0.5 ng/mL: Systemic infection (sepsis) is not likely. Local bacterial infection is possible. (NOTE)       Sepsis PCT Algorithm           Lower Respiratory Tract                                      Infection PCT Algorithm    ----------------------------     ----------------------------         PCT < 0.25 ng/mL                PCT < 0.10 ng/mL          Strongly encourage             Strongly discourage   discontinuation of antibiotics    initiation of antibiotics    ----------------------------     -----------------------------       PCT  0.25 - 0.50 ng/mL            PCT 0.10 - 0.25 ng/mL  OR       >80% decrease in PCT            Discourage initiation of                                            antibiotics      Encourage discontinuation           of antibiotics    ----------------------------     -----------------------------         PCT >= 0.50 ng/mL              PCT 0.26 - 0.50 ng/mL               AND        <80% decrease in PCT             Encourage initiation of                                             antibiotics       Encourage continuation           of antibiotics    ----------------------------     -----------------------------        PCT >= 0.50 ng/mL                  PCT > 0.50 ng/mL               AND         increase in PCT                  Strongly encourage                                      initiation of antibiotics    Strongly encourage escalation           of antibiotics                                     -----------------------------                                           PCT <= 0.25 ng/mL                                                 OR                                        > 80% decrease in PCT                                      Discontinue / Do not initiate  antibiotics  Performed at [facility], [address], [place], [place] [address]   CBC     Status: Abnormal   Collection Time: 01/24/24  4:04 AM  Result Value Ref Range   WBC 7.3 4.0 - 10.5 K/uL   RBC 4.16 3.87 - 5.11 MIL/uL   Hemoglobin 12.3 12.0 - 15.0 g/dL   HCT 66.8 (L) 63.9 - 53.9 %   MCV 79.6 (L) 80.0 - 100.0 fL   MCH 29.6 26.0 - 34.0 pg   MCHC 37.2 (H) 30.0 - 36.0 g/dL   RDW 86.3 88.4 - 84.4 %   Platelets 176 150 - 400 K/uL   nRBC 0.0 0.0 - 0.2 %    Comment: Performed at [facility], [address] [place], [place] [address]  Comprehensive metabolic panel     Status: Abnormal   Collection Time: 01/24/24  4:04 AM  Result Value Ref Range    Sodium 135 135 - 145 mmol/L   Potassium 3.1 (L) 3.5 - 5.1 mmol/L   Chloride 102 98 - 111 mmol/L   CO2 19 (L) 22 - 32 mmol/L   Glucose, Bld 260 (H) 70 - 99 mg/dL    Comment: Glucose reference range applies only to samples taken after fasting for at least 8 hours.   BUN <5 (L) 6 - 20 mg/dL   Creatinine, Ser 9.27 0.44 - 1.00 mg/dL   Calcium  7.7 (L) 8.9 - 10.3 mg/dL   Total Protein 5.7 (L) 6.5 - 8.1 g/dL   Albumin 3.0 (L) 3.5 - 5.0 g/dL   AST 57 (H) 15 - 41 U/L   ALT 73 (H) 0 - 44 U/L   Alkaline Phosphatase 43 38 - 126 U/L   Total Bilirubin 0.5 0.0 - 1.2 mg/dL   GFR, Estimated >39 >39 mL/min    Comment: (NOTE) Calculated using the CKD-EPI Creatinine Equation (2021)    Anion gap 14 5 - 15    Comment: Performed at [facility], [address], [place], [place] [address]  Magnesium      Status: Abnormal   Collection Time: 01/24/24  4:04 AM  Result Value Ref Range   Magnesium  1.5 (L) 1.7 - 2.4 mg/dL    Comment: Performed at [facility], [address], [place], [place] [address]  Beta-hydroxybutyric acid     Status: Abnormal   Collection Time: 01/24/24  4:04 AM  Result Value Ref Range   Beta-Hydroxybutyric Acid 1.84 (H) 0.05 - 0.27 mmol/L    Comment: Performed at [facility], [address] [place], [place] [address]  Basic metabolic panel     Status: Abnormal   Collection Time: 01/24/24  7:15 AM  Result Value Ref Range   Sodium 137 135 - 145 mmol/L   Potassium 3.3 (L) 3.5 - 5.1 mmol/L   Chloride 106 98 - 111 mmol/L   CO2 21 (L) 22 - 32 mmol/L   Glucose, Bld 178 (H) 70 - 99 mg/dL    Comment: Glucose reference range applies only to samples taken after fasting for at least 8 hours.   BUN <5 (L) 6 - 20 mg/dL   Creatinine, Ser 9.34 0.44 - 1.00 mg/dL   Calcium  8.1 (L) 8.9 - 10.3 mg/dL   GFR, Estimated >39 >39 mL/min    Comment: (NOTE) Calculated using the CKD-EPI Creatinine Equation (2021)    Anion gap 10 5 - 15    Comment: Performed at [facility]  Lab, [address] [place], [place] [address]  Beta-hydroxybutyric acid     Status: None   Collection Time: 01/24/24  7:15 AM  Result Value Ref Range   Beta-Hydroxybutyric Acid 0.08 0.05 - 0.27 mmol/L    Comment: Performed at [facility], [address], [place], [place] [address]    US  Abdomen Limited RUQ (LIVER/GB) Result Date: 01/24/2024 CLINICAL DATA:  Elevated liver enzymes. EXAM: ULTRASOUND ABDOMEN LIMITED RIGHT UPPER QUADRANT COMPARISON:  CT scan 04/07/2022 FINDINGS: Gallbladder: No gallstones or gallbladder wall thickening. No pericholecystic fluid. The sonographer reports no sonographic Murphy's sign. Common bile duct: Diameter: 2 mm Liver: No focal lesion identified. Within normal limits in parenchymal echogenicity. Portal vein is patent on color Doppler imaging with normal direction of blood flow towards the liver. Other: None. IMPRESSION: Unremarkable right upper quadrant ultrasound. Electronically Signed   By: Camellia Candle M.D.   On: 01/24/2024 07:50   US  OB LESS THAN 14 WEEKS WITH OB TRANSVAGINAL Result Date: 01/23/2024 EXAM: OBSTETRIC ULTRASOUND FIRST TRIMESTER TECHNIQUE: Transvaginal first trimester obstetric pelvic duplex ultrasound was performed with real-time imaging, color flow Doppler imaging, and spectral analysis. COMPARISON: CT abdomen and pelvis 04/07/2022. CLINICAL HISTORY: Pregnancy, location unknown [phone]. last menstrual: 12/11/2023. Gestational age by last menstrual: 6 weeks and 1 day. Estimated due date: 09/16/2024 FINDINGS: UTERUS: 3.1 x 2.9 x 2.7 cm left uterine fibroid. GESTATIONAL SAC(S): Single intrauterine gestational sac within the endometrium. No subchorionic hemorrhage. YOLK SAC: No yolk sac. EMBRYO(<11WK) /FETUS(>=11WK): No embryo. CROWN RUMP LENGTH: Not measured. RIGHT OVARY: Unremarkable. Normal arterial and venous flow. LEFT OVARY: Unremarkable. Normal arterial and venous flow. FREE FLUID: No free fluid. MEASUREMENTS ESTIMATED GESTATIONAL AGE BY CURRENT  ULTRASOUND: 5 weeks and 2 days gestation (based on mean sac diameter of 6 mm). IMPRESSION: 1. Probable early intrauterine gestational sac, but no yolk sac, fetal pole, or cardiac activity yet visualized. Recommend follow-up quantitative B-HCG levels and follow-up US  in 14 days to assess viability. This recommendation follows SRU consensus guidelines: Diagnostic Criteria for Nonviable Pregnancy Early in the First Trimester. LOISE Diedra PARAS Med 2013; 630:8556-48. 2. Left uterine fibroid measuring 3.1 x 2.9 x 2.7 cm. Electronically signed by: Morgane Naveau MD 01/23/2024 09:06 PM EDT RP Workstation: [id number]   Current scheduled medications  labetalol  200 mg Oral BID   prenatal multivitamin  1 tablet Oral Q1200   I have reviewed the patient's current medications.  ASSESSMENT: Principal Problem:   Fever Active Problems:   Hypokalemia   Hyperglycemia due to type 2 diabetes mellitus (HCC)   PLAN: Fever - Unclear source at this time: ddx includes UTI, gastroenteritis, skin/soft tissue infection (vulva), or septic abortion. Has received abx x 1-2d prior to her presentation which may affect her Ucx results if urinary source - This is a desired pregnancy and given diagnostic uncertainty re: septic AB, no leukocytosis/lactic acidosis, and rapid improvement with tylenol  alone, would not recommend D&E at this time. Low threshold to proceed with surgical management if clinical status changes - Bcx, Ucx, GC/CT & HSV testing pending. C diff & GI pathogen panel added & pending; enteric precautions until results return - Continue vanc/cefepime/flagyl - potential reaction to vanc during infusion (see note from rapid response RN). D/w pharmacy who agree with restarting at a lower rate. Narrow pending culture results, however pt reports this morning a Ucx w/ GBS on 10/15. Will try to obtain copy of report   2. Hyperglycemia, c/f DKA; hypokalemia - Medicine consulted, appreciate recs - 8am labs with  significant  improvement - beta-hydroxybutryic acid now negative (0.08), glucose < 200, anion gap closed & K 3.3 - Continue endotool, electrolyte repletion, labs q4  Kieth Carolin, MD Obstetrician & Gynecologist, [facility] for [employer], [facility]

## 2024-01-24 NOTE — Progress Notes (Signed)
 In to see patient to review +HSV-2 result.   Discussed HSV-2 result with patient and answered questions about testing, transmission, etc. She has never had symptoms like this before and has no known history of genital HSV. She has 2 painful left labial lesions. Primary HSV infection could explain her malaise, dysuria and fevers. She also reports she got positive Ucx from urgent care. Result reviewed on her phone - Ucx +GBS 10-25k cfu & mixed flora 10-25k cfu; unlikely to represent UTI.   Pt is distressed by these results and has made statements to her RN concerning for passive SI. She denies any intent/plan, specifically stating this is not something to kill yourself over. She requests repeat testing of her genital lesions to confirm diagnosis so this was collected.   A/P: - HSV serologies ordered to determine if primary, non primary first genital episode or recurrent outbreak.  - Valtrex 1g BID x 7d ordered for presumed primary outbreak - GC/CT pending; HIV/RPR ordered  - D/w pharmacy, will narrow to vanc/cefepime for now and further discuss pending patient's clinical course - SW consult in AM   Kieth Carolin, MD Obstetrician & Gynecologist, [facility] for [employer], [facility]

## 2024-01-24 NOTE — Inpatient Diabetes Management (Signed)
 Inpatient Diabetes Program Recommendations  ADA Standards of Care 2025 Diabetes in Pregnancy Target Glucose Ranges:  Fasting: 70 - 95 mg/dL 1 hr postprandial:  889 - 140mg /dL (from first bite of meal) 2 hr postprandial:  100 - 120 mg/dL (from first bit of meal)    Lab Results  Component Value Date   GLUCAP 122 (H) 01/24/2024   HGBA1C 6.5 (H) 01/23/2024    Review of Glycemic Control  Diabetes history: DM 2 Outpatient Diabetes medications: Freestyle Libre 3 plus, Novolog  10-20 tid, Tresiba  46 units Daily Current orders for Inpatient glycemic control:  Novolog  0-9 units tid + hs Semglee 20 units Q24 hours  Established with Endocrinology Dr. Trixie previously with Dr. Kassie  Inpatient Diabetes Program Recommendations:    May need to transition pt to pregnancy insulin  orders based on order set, checking glucose fasting and 2 hours postprandially. Early pregnancy.  Will monitor trends.  Thanks, Clotilda Bull RN, MSN, BC-ADM Inpatient Diabetes Coordinator Team Pager [phone] (8a-5p)

## 2024-01-25 ENCOUNTER — Other Ambulatory Visit (HOSPITAL_COMMUNITY): Payer: Self-pay

## 2024-01-25 ENCOUNTER — Telehealth (HOSPITAL_COMMUNITY): Payer: Self-pay

## 2024-01-25 DIAGNOSIS — A6000 Herpesviral infection of urogenital system, unspecified: Secondary | ICD-10-CM

## 2024-01-25 DIAGNOSIS — E876 Hypokalemia: Secondary | ICD-10-CM

## 2024-01-25 LAB — URINE CULTURE: Culture: NO GROWTH

## 2024-01-25 LAB — CBC
HCT: 33.3 % — ABNORMAL LOW (ref 36.0–46.0)
Hemoglobin: 12.4 g/dL (ref 12.0–15.0)
MCH: 29.4 pg (ref 26.0–34.0)
MCHC: 37.2 g/dL — ABNORMAL HIGH (ref 30.0–36.0)
MCV: 78.9 fL — ABNORMAL LOW (ref 80.0–100.0)
Platelets: 137 K/uL — ABNORMAL LOW (ref 150–400)
RBC: 4.22 MIL/uL (ref 3.87–5.11)
RDW: 13.7 % (ref 11.5–15.5)
WBC: 10.8 K/uL — ABNORMAL HIGH (ref 4.0–10.5)
nRBC: 0 % (ref 0.0–0.2)

## 2024-01-25 LAB — BASIC METABOLIC PANEL WITH GFR
Anion gap: 11 (ref 5–15)
BUN: <5 mg/dL — ABNORMAL LOW (ref 6–20)
CO2: 19 mmol/L — ABNORMAL LOW (ref 22–32)
Calcium: 7.9 mg/dL — ABNORMAL LOW (ref 8.9–10.3)
Chloride: 103 mmol/L (ref 98–111)
Creatinine, Ser: 0.64 mg/dL (ref 0.44–1.00)
GFR, Estimated: >60 mL/min (ref >60)
Glucose, Bld: 206 mg/dL — ABNORMAL HIGH (ref 70–99)
Potassium: 3.2 mmol/L — ABNORMAL LOW (ref 3.5–5.1)
Sodium: 133 mmol/L — ABNORMAL LOW (ref 135–145)

## 2024-01-25 LAB — RPR: RPR Ser Ql: NONREACTIVE

## 2024-01-25 LAB — GLUCOSE, CAPILLARY
Glucose-Capillary: 170 mg/dL — ABNORMAL HIGH (ref 70–99)
Glucose-Capillary: 164 mg/dL — ABNORMAL HIGH (ref 70–99)
Glucose-Capillary: 200 mg/dL — ABNORMAL HIGH (ref 70–99)
Glucose-Capillary: 170 mg/dL — ABNORMAL HIGH (ref 70–99)
Glucose-Capillary: 151 mg/dL — ABNORMAL HIGH (ref 70–99)
Glucose-Capillary: 65 mg/dL — ABNORMAL LOW (ref 70–99)
Glucose-Capillary: 80 mg/dL (ref 70–99)

## 2024-01-25 LAB — HSV 1/2 PCR (SURFACE)
HSV-1 DNA: NOT DETECTED
HSV-2 DNA: DETECTED — AB

## 2024-01-25 LAB — BETA-HYDROXYBUTYRIC ACID: Beta-Hydroxybutyric Acid: 0.36 mmol/L — ABNORMAL HIGH (ref 0.05–0.27)

## 2024-01-25 LAB — HIV ANTIBODY (ROUTINE TESTING W REFLEX): HIV Screen 4th Generation wRfx: NONREACTIVE

## 2024-01-25 LAB — GC/CHLAMYDIA PROBE AMP (~~LOC~~) NOT AT ARMC
Chlamydia: NEGATIVE
Comment: NEGATIVE
Comment: NORMAL
Neisseria Gonorrhea: NEGATIVE

## 2024-01-25 LAB — HCG, QUANTITATIVE, PREGNANCY: hCG, Beta Chain, Quant, S: 210 m[IU]/mL — ABNORMAL HIGH (ref <5)

## 2024-01-25 MED ORDER — VANCOMYCIN HCL 750 MG IV SOLR
750.0000 mg | Freq: Two times a day (BID) | INTRAVENOUS | Status: DC
Start: 2024-01-25 — End: 2024-01-26
  Administered 2024-01-25 – 2024-01-26 (×2): 750 mg via INTRAVENOUS
  Filled 2024-01-25 (×4): qty 15

## 2024-01-25 MED ORDER — POTASSIUM CHLORIDE 20 MEQ PO PACK
20.0000 meq | PACK | Freq: Every day | ORAL | Status: DC
Start: 2024-01-25 — End: 2024-01-27
  Administered 2024-01-25 – 2024-01-27 (×2): 20 meq via ORAL
  Filled 2024-01-25 (×2): qty 1

## 2024-01-25 MED ORDER — INSULIN GLARGINE-YFGN 100 UNIT/ML ~~LOC~~ SOLN
30.0000 [IU] | SUBCUTANEOUS | Status: DC
  Administered 2024-01-25: 30 [IU] via SUBCUTANEOUS
  Filled 2024-01-25: qty 0.3

## 2024-01-25 MED ORDER — INSULIN ASPART 100 UNIT/ML IJ SOLN: 0.0000 [IU] | Freq: Three times a day (TID) | INTRAMUSCULAR | Status: DC

## 2024-01-25 MED ORDER — POTASSIUM CHLORIDE CRYS ER 20 MEQ PO TBCR
40.0000 meq | EXTENDED_RELEASE_TABLET | Freq: Once | ORAL | Status: AC
  Administered 2024-01-25: 40 meq via ORAL
  Filled 2024-01-25: qty 2

## 2024-01-25 MED ORDER — LOPERAMIDE HCL 2 MG PO CAPS
2.0000 mg | ORAL_CAPSULE | ORAL | Status: DC | PRN
  Administered 2024-01-25 – 2024-01-26 (×3): 2 mg via ORAL
  Filled 2024-01-25 (×3): qty 1

## 2024-01-25 MED ORDER — INSULIN ASPART 100 UNIT/ML IJ SOLN
0.0000 [IU] | Freq: Three times a day (TID) | INTRAMUSCULAR | Status: DC
  Administered 2024-01-25: 3 [IU] via SUBCUTANEOUS

## 2024-01-25 MED ORDER — INSULIN ASPART 100 UNIT/ML IJ SOLN
0.0000 [IU] | INTRAMUSCULAR | Status: DC
  Administered 2024-01-25 (×2): 1 [IU] via SUBCUTANEOUS
  Administered 2024-01-26: 3 [IU] via SUBCUTANEOUS
  Administered 2024-01-26: 2 [IU] via SUBCUTANEOUS
  Administered 2024-01-27: 1 [IU] via SUBCUTANEOUS

## 2024-01-25 MED ORDER — INSULIN ASPART 100 UNIT/ML IJ SOLN: 6.0000 [IU] | Freq: Three times a day (TID) | INTRAMUSCULAR | Status: DC

## 2024-01-25 MED ORDER — VANCOMYCIN HCL 1000 MG IV SOLR: 750.0000 mg | Freq: Two times a day (BID) | INTRAVENOUS | Status: DC

## 2024-01-25 MED ORDER — INSULIN ASPART 100 UNIT/ML IJ SOLN
6.0000 [IU] | Freq: Three times a day (TID) | INTRAMUSCULAR | Status: DC
  Administered 2024-01-25: 6 [IU] via SUBCUTANEOUS

## 2024-01-25 MED ORDER — INSULIN GLARGINE-YFGN 100 UNIT/ML ~~LOC~~ SOLN
25.0000 [IU] | SUBCUTANEOUS | Status: DC
  Filled 2024-01-25: qty 0.25

## 2024-01-25 MED ORDER — LACTATED RINGERS IV SOLN: INTRAVENOUS | Status: AC

## 2024-01-25 NOTE — Progress Notes (Signed)
 Hypoglycemic Event  CBG: 65  Treatment: 4 oz juice/soda  Symptoms: None  Follow-up CBG: Time:1530 CBG Result:80  Possible Reasons for Event: Inadequate meal intake and medication regimen  Comments/MD notified: Dr. Cherlyn notified. See new orders.     Lauraine LOISE Drew

## 2024-01-25 NOTE — Telephone Encounter (Signed)
 Pharmacy Patient Advocate Encounter  Insurance verification completed.    The patient is insured through [facility]. Patient has [employer], may use a copay card, and/or apply for patient assistance if available.    Ran test claim for FreeStyle Libre 3 plus sensors and the current 30 day co-pay is $74.99.   This test claim was processed through [facility]- copay amounts may vary at other pharmacies due to pharmacy/plan contracts, or as the patient moves through the different stages of their insurance plan.

## 2024-01-25 NOTE — Progress Notes (Signed)
 [facility]                                                                               [employer], is a 33 y.o. female, DOB - 03-25-1991, FMW:[id number] Admit date - 01/23/2024    Outpatient Primary MD for the patient is Chrystal Lamarr RAMAN, MD  LOS - 2  days    Brief summary   Allison Mathews is an 32 y.o. female with past medical history of insulin -dependent type 2 diabetes, hypertension, hyperlipidemia, sickle cell trait, anemia, G4P2012 at [redacted]w[redacted]d presented to [facility] for lab abnormalities noted by her PCP.  Patient reportedly seen at urgent care earlier this week for dysuria and urinary frequency.  They dropped her urine sample so did not test it but started on empiric antibiotics 2 days ago.  Patient saw her PCP yesterday for malaise and feeling hot.  She had outpatient labs done which showed hyperglycemia with elevated anion gap, elevated liver enzymes, and low potassium so she was sent to MICU.    Assessment & Plan    Assessment and Plan:    Sepsis: Unclear etiology. Febrile this am 100.6 around 5:30 am today, .  She reports vaginal pain.  COVID, respiratory panel and influenza are negative. She denies any respiratory symptoms.  C diff pcr is negative. Lactate normal.  GI panel is negative.  Blood cultures are negative.  UA Showing small leukocytosis , rare bacteria.  She denies any headache, neck pain .  HSV 2 DNA is positive. GC/CT negative.  Differential include UTI vs septic abortion. She was started on broad spectrum IV antibiotics. Recommend to continue the same.    Early DKA:  Hyperglycemia  Resolved.    Uncontrolled DM with hypoglycemia Insulin  dependent type 2 DM.  Labile cbg's. Started the patient on Semglee 25 units and check cbg's every 4 hours.  CBG (last 3)  Recent Labs    01/25/24 1244 01/25/24 1456 01/25/24 1530  GLUCAP 151* 65* 80      Hypokalemia and hypomagnesemia Replaced.    Mild thrombocytopenia. Recheck  cbc in am with differential.    Mildly elevated transaminases Suspect from sepsis. Recheck in am.    Hypertension Optimal BP parameters.   HSV 2 Infection Started her valacyclovir.   Estimated body mass index is 32.76 kg/m as calculated from the following:   Height as of 10/26/23: 4' 11 (1.499 m).   Weight as of this encounter: 73.6 kg.  Code Status: full code.  DVT Prophylaxis:  SCDs Start: 01/23/24 2345   Level of Care: Level of care: Antepartum Family Communication: none at bedside.   Disposition Plan:     Remains inpatient appropriate:  pending clinical improvement.    Antimicrobials:   Anti-infectives (From admission, onward)    Start     Dose/Rate Route Frequency Ordered Stop   01/24/24 2300  valACYclovir (VALTREX) tablet 1,000 mg        1,000 mg Oral 2 times daily 01/24/24 2207 01/31/24 2159   01/24/24 1400  metroNIDAZOLE (FLAGYL) IVPB 500 mg  Status:  Discontinued        500 mg 100 mL/hr over 60 Minutes Intravenous Every 12  hours 01/24/24 0003 01/24/24 2258   01/24/24 1200  vancomycin (VANCOCIN) IVPB 750 mg/150 ml premix  Status:  Discontinued        750 mg 75 mL/hr over 120 Minutes Intravenous Every 12 hours 01/24/24 0003 01/24/24 0700   01/24/24 1000  vancomycin (VANCOCIN) 750 mg in sodium chloride  0.9 % 250 mL IVPB  Status:  Discontinued        750 mg 265 mL/hr over 60 Minutes Intravenous Every 12 hours 01/23/24 2115 01/23/24 2348   01/24/24 1000  vancomycin (VANCOCIN) 750 mg in sodium chloride  0.9 % 250 mL IVPB        750 mg 265 mL/hr over 60 Minutes Intravenous Every 12 hours 01/24/24 0700     01/24/24 0630  ceFEPIme (MAXIPIME) 2 g in sodium chloride  0.9 % 100 mL IVPB        2 g 200 mL/hr over 30 Minutes Intravenous Every 8 hours 01/24/24 0003     01/24/24 0600  ceFEPIme (MAXIPIME) 2 g in sodium chloride  0.9 % 100 mL IVPB  Status:  Discontinued        2 g 200 mL/hr over 30 Minutes Intravenous Every 8 hours 01/23/24 2117 01/23/24 2348   01/23/24 2000   ceFEPIme (MAXIPIME) 2 g in sodium chloride  0.9 % 100 mL IVPB        2 g 200 mL/hr over 30 Minutes Intravenous  Once 01/23/24 1943 01/23/24 2245   01/23/24 2000  metroNIDAZOLE (FLAGYL) IVPB 500 mg  Status:  Discontinued        500 mg 100 mL/hr over 60 Minutes Intravenous Every 12 hours 01/23/24 1943 01/23/24 2348   01/23/24 2000  vancomycin (VANCOCIN) 1,500 mg in sodium chloride  0.9 % 500 mL IVPB  Status:  Discontinued        1,500 mg 257.5 mL/hr over 120 Minutes Intravenous  Once 01/23/24 1943 01/23/24 1958   01/23/24 2000  Vancomycin (VANCOCIN) 1,250 mg in sodium chloride  0.9 % 250 mL IVPB        1,250 mg 166.7 mL/hr over 90 Minutes Intravenous  Once 01/23/24 1958 01/23/24 2332        Medications  Scheduled Meds:  insulin  aspart  0-14 Units Subcutaneous TID PC   insulin  aspart  6 Units Subcutaneous TID WC   insulin  glargine-yfgn  30 Units Subcutaneous Q24H   labetalol  200 mg Oral BID   potassium chloride   20 mEq Oral Daily   potassium chloride   40 mEq Oral Once   prenatal multivitamin  1 tablet Oral Q1200   valACYclovir  1,000 mg Oral BID   Continuous Infusions:  ceFEPime (MAXIPIME) IV 2 g (01/25/24 0541)   vancomycin (VANCOCIN) 750 mg in sodium chloride  0.9 % 250 mL IVPB 750 mg (01/25/24 1036)   PRN Meds:.acetaminophen , calcium  carbonate, dextrose , diphenhydrAMINE , ondansetron  (ZOFRAN ) IV    Subjective:   Allison Mathews was seen and examined today.  Some vaginal pain. No nausea vomiting or abdominal pain.   Objective:   Vitals:   01/25/24 0100 01/25/24 0531 01/25/24 0804 01/25/24 1144  BP: (!) 148/80 137/78 132/77 126/66  Pulse: (!) 110 (!) 113 98 82  Resp: 20 18 17 16   Temp: 98.4 F (36.9 C) (!) 100.6 F (38.1 C) 98.3 F (36.8 C) 98.4 F (36.9 C)  TempSrc: Oral Oral Oral Oral  SpO2: 97% 95% 98% 100%  Weight:        Intake/Output Summary (Last 24 hours) at 01/25/2024 1404 Last data filed at 01/25/2024 1037 Gross  per 24 hour  Intake 324.27 ml  Output  700 ml  Net -375.73 ml   Filed Weights   01/23/24 1837  Weight: 73.6 kg     Exam General exam: Appears calm and comfortable  Respiratory system: Clear to auscultation. Respiratory effort normal. Cardiovascular system: S1 & S2 heard, RRR. No JVD Gastrointestinal system: Abdomen is nondistended, soft and nontender.  Central nervous system: Alert and oriented. Extremities: Symmetric 5 x 5 power. Skin: No rashes, Psychiatry: Mood & affect appropriate.     Data Reviewed:  I have personally reviewed following labs and imaging studies   CBC Lab Results  Component Value Date   WBC 10.8 (H) 01/25/2024   RBC 4.22 01/25/2024   HGB 12.4 01/25/2024   HCT 33.3 (L) 01/25/2024   MCV 78.9 (L) 01/25/2024   MCH 29.4 01/25/2024   PLT 137 (L) 01/25/2024   MCHC 37.2 (H) 01/25/2024   RDW 13.7 01/25/2024   LYMPHSABS 1.1 01/24/2024   MONOABS 0.9 01/24/2024   EOSABS 0.0 01/24/2024   BASOSABS 0.0 01/24/2024     Last metabolic panel Lab Results  Component Value Date   NA 133 (L) 01/25/2024   K 3.2 (L) 01/25/2024   CL 103 01/25/2024   CO2 19 (L) 01/25/2024   BUN <5 (L) 01/25/2024   CREATININE 0.64 01/25/2024   GLUCOSE 206 (H) 01/25/2024   GFRNONAA >60 01/25/2024   GFRAA >60 08/31/2017   CALCIUM  7.9 (L) 01/25/2024   PROT 5.7 (L) 01/24/2024   ALBUMIN 3.0 (L) 01/24/2024   BILITOT 0.5 01/24/2024   ALKPHOS 43 01/24/2024   AST 57 (H) 01/24/2024   ALT 73 (H) 01/24/2024   ANIONGAP 11 01/25/2024    CBG (last 3)  Recent Labs    01/25/24 0544 01/25/24 1009 01/25/24 1244  GLUCAP 200* 170* 151*      Coagulation Profile: Recent Labs  Lab 01/23/24 2018  INR 1.1     Radiology Studies: US  Abdomen Limited RUQ (LIVER/GB) Result Date: 01/24/2024 CLINICAL DATA:  Elevated liver enzymes. EXAM: ULTRASOUND ABDOMEN LIMITED RIGHT UPPER QUADRANT COMPARISON:  CT scan 04/07/2022 FINDINGS: Gallbladder: No gallstones or gallbladder wall thickening. No pericholecystic fluid. The sonographer  reports no sonographic Murphy's sign. Common bile duct: Diameter: 2 mm Liver: No focal lesion identified. Within normal limits in parenchymal echogenicity. Portal vein is patent on color Doppler imaging with normal direction of blood flow towards the liver. Other: None. IMPRESSION: Unremarkable right upper quadrant ultrasound. Electronically Signed   By: Camellia Candle M.D.   On: 01/24/2024 07:50   US  OB LESS THAN 14 WEEKS WITH OB TRANSVAGINAL Result Date: 01/23/2024 EXAM: OBSTETRIC ULTRASOUND FIRST TRIMESTER TECHNIQUE: Transvaginal first trimester obstetric pelvic duplex ultrasound was performed with real-time imaging, color flow Doppler imaging, and spectral analysis. COMPARISON: CT abdomen and pelvis 04/07/2022. CLINICAL HISTORY: Pregnancy, location unknown [phone]. last menstrual: 12/11/2023. Gestational age by last menstrual: 6 weeks and 1 day. Estimated due date: 09/16/2024 FINDINGS: UTERUS: 3.1 x 2.9 x 2.7 cm left uterine fibroid. GESTATIONAL SAC(S): Single intrauterine gestational sac within the endometrium. No subchorionic hemorrhage. YOLK SAC: No yolk sac. EMBRYO(<11WK) /FETUS(>=11WK): No embryo. CROWN RUMP LENGTH: Not measured. RIGHT OVARY: Unremarkable. Normal arterial and venous flow. LEFT OVARY: Unremarkable. Normal arterial and venous flow. FREE FLUID: No free fluid. MEASUREMENTS ESTIMATED GESTATIONAL AGE BY CURRENT ULTRASOUND: 5 weeks and 2 days gestation (based on mean sac diameter of 6 mm). IMPRESSION: 1. Probable early intrauterine gestational sac, but no yolk sac, fetal pole, or cardiac activity yet visualized.  Recommend follow-up quantitative B-HCG levels and follow-up US  in 14 days to assess viability. This recommendation follows SRU consensus guidelines: Diagnostic Criteria for Nonviable Pregnancy Early in the First Trimester. LOISE Diedra PARAS Med 2013; 630:8556-48. 2. Left uterine fibroid measuring 3.1 x 2.9 x 2.7 cm. Electronically signed by: Morgane Naveau MD 01/23/2024 09:06 PM EDT RP  Workstation: [id number]       Elgie Butter M.D. [facility] 01/25/2024, 2:04 PM  Available via Epic secure chat 7am-7pm After 7 pm, please refer to night coverage provider listed on amion.

## 2024-01-25 NOTE — Progress Notes (Addendum)
 FACULTY PRACTICE ANTEPARTUM(COMPREHENSIVE) NOTE  Allison Mathews is a 33 y.o. [id number] with Estimated Date of Delivery: 09/16/24   By  LMP [redacted]w[redacted]d  who is admitted for fever of unknown etiology/ T2DM with hyperglycemia.    Length of Stay:  2  Days  Date of admission:01/23/2024  Subjective: Pt resting comfortably in bed- reports no acute complaints. Notes fever, no chills.  Decreased appetite and nausea, no vomiting.  Denies SOB or CP.  Denies pelvic or abdominal pain.  Denies vaginal bleeding.  No acute complaints this am.   Vitals:  Blood pressure 132/77, pulse 98, temperature 98.3 F (36.8 C), temperature source Oral, resp. rate 17, weight 73.6 kg, last menstrual period 12/11/2023, SpO2 98%. Vitals:   01/24/24 2032 01/25/24 0100 01/25/24 0531 01/25/24 0804  BP: 134/72 (!) 148/80 137/78 132/77  Pulse: 89 (!) 110 (!) 113 98  Resp: 18 20 18 17   Temp: 98.1 F (36.7 C) 98.4 F (36.9 C) (!) 100.6 F (38.1 C) 98.3 F (36.8 C)  TempSrc: Oral Oral Oral Oral  SpO2: 100% 97% 95% 98%  Weight:       Physical Examination:  General appearance - alert, well appearing, and in no distress Mental status - appropriate mood and behavior Chest - CTAB Heart - normal rate and regular rhythm Abdomen - soft, non-tender, no rebound or guarding Extremities - no edema, no calf tenderness bilaterally Skin - warm and dry   Labs:  Results for orders placed or performed during the hospital encounter of 01/23/24 (from the past 24 hours)  Glucose, capillary   Collection Time: 01/24/24 10:24 AM  Result Value Ref Range   Glucose-Capillary 100 (H) 70 - 99 mg/dL  Basic metabolic panel   Collection Time: 01/24/24 11:16 AM  Result Value Ref Range   Sodium 136 135 - 145 mmol/L   Potassium 3.9 3.5 - 5.1 mmol/L   Chloride 106 98 - 111 mmol/L   CO2 19 (L) 22 - 32 mmol/L   Glucose, Bld 141 (H) 70 - 99 mg/dL   BUN <5 (L) 6 - 20 mg/dL   Creatinine, Ser 9.33 0.44 - 1.00 mg/dL   Calcium  7.6 (L) 8.9 - 10.3 mg/dL    GFR, Estimated >39 >39 mL/min   Anion gap 11 5 - 15  Beta-hydroxybutyric acid   Collection Time: 01/24/24 11:16 AM  Result Value Ref Range   Beta-Hydroxybutyric Acid 0.17 0.05 - 0.27 mmol/L  Glucose, capillary   Collection Time: 01/24/24 11:22 AM  Result Value Ref Range   Glucose-Capillary 122 (H) 70 - 99 mg/dL  Glucose, capillary   Collection Time: 01/24/24  2:06 PM  Result Value Ref Range   Glucose-Capillary 190 (H) 70 - 99 mg/dL  Glucose, capillary   Collection Time: 01/24/24  7:38 PM  Result Value Ref Range   Glucose-Capillary 170 (H) 70 - 99 mg/dL  .HSV 1/2 PCR, SURFACE (performed in [facility] lab)   Collection Time: 01/24/24 10:04 PM  Result Value Ref Range   Specimen Source - HSV ULCER    HSV-1 DNA NOT DETECTED NOT DETECTED   HSV-2 DNA DETECTED (A) NOT DETECTED  Glucose, capillary   Collection Time: 01/24/24 10:35 PM  Result Value Ref Range   Glucose-Capillary 164 (H) 70 - 99 mg/dL  Basic metabolic panel   Collection Time: 01/25/24  5:04 AM  Result Value Ref Range   Sodium 133 (L) 135 - 145 mmol/L   Potassium 3.2 (L) 3.5 - 5.1 mmol/L   Chloride 103 98 -  111 mmol/L   CO2 19 (L) 22 - 32 mmol/L   Glucose, Bld 206 (H) 70 - 99 mg/dL   BUN <5 (L) 6 - 20 mg/dL   Creatinine, Ser 9.35 0.44 - 1.00 mg/dL   Calcium  7.9 (L) 8.9 - 10.3 mg/dL   GFR, Estimated >39 >39 mL/min   Anion gap 11 5 - 15  Beta-hydroxybutyric acid   Collection Time: 01/25/24  5:04 AM  Result Value Ref Range   Beta-Hydroxybutyric Acid 0.36 (H) 0.05 - 0.27 mmol/L  CBC   Collection Time: 01/25/24  5:04 AM  Result Value Ref Range   WBC 10.8 (H) 4.0 - 10.5 K/uL   RBC 4.22 3.87 - 5.11 MIL/uL   Hemoglobin 12.4 12.0 - 15.0 g/dL   HCT 66.6 (L) 63.9 - 53.9 %   MCV 78.9 (L) 80.0 - 100.0 fL   MCH 29.4 26.0 - 34.0 pg   MCHC 37.2 (H) 30.0 - 36.0 g/dL   RDW 86.2 88.4 - 84.4 %   Platelets 137 (L) 150 - 400 K/uL   nRBC 0.0 0.0 - 0.2 %  HIV Antibody (routine testing w rflx)   Collection Time: 01/25/24  5:04 AM   Result Value Ref Range   HIV Screen 4th Generation wRfx Non Reactive Non Reactive  RPR   Collection Time: 01/25/24  5:04 AM  Result Value Ref Range   RPR Ser Ql NON REACTIVE NON REACTIVE  Glucose, capillary   Collection Time: 01/25/24  5:44 AM  Result Value Ref Range   Glucose-Capillary 200 (H) 70 - 99 mg/dL    Imaging Studies:    US  Abdomen Limited RUQ (LIVER/GB) Result Date: 01/24/2024 CLINICAL DATA:  Elevated liver enzymes. EXAM: ULTRASOUND ABDOMEN LIMITED RIGHT UPPER QUADRANT COMPARISON:  CT scan 04/07/2022 FINDINGS: Gallbladder: No gallstones or gallbladder wall thickening. No pericholecystic fluid. The sonographer reports no sonographic Murphy's sign. Common bile duct: Diameter: 2 mm Liver: No focal lesion identified. Within normal limits in parenchymal echogenicity. Portal vein is patent on color Doppler imaging with normal direction of blood flow towards the liver. Other: None. IMPRESSION: Unremarkable right upper quadrant ultrasound. Electronically Signed   By: Camellia Candle M.D.   On: 01/24/2024 07:50   US  OB LESS THAN 14 WEEKS WITH OB TRANSVAGINAL Result Date: 01/23/2024 EXAM: OBSTETRIC ULTRASOUND FIRST TRIMESTER TECHNIQUE: Transvaginal first trimester obstetric pelvic duplex ultrasound was performed with real-time imaging, color flow Doppler imaging, and spectral analysis. COMPARISON: CT abdomen and pelvis 04/07/2022. CLINICAL HISTORY: Pregnancy, location unknown [phone]. last menstrual: 12/11/2023. Gestational age by last menstrual: 6 weeks and 1 day. Estimated due date: 09/16/2024 FINDINGS: UTERUS: 3.1 x 2.9 x 2.7 cm left uterine fibroid. GESTATIONAL SAC(S): Single intrauterine gestational sac within the endometrium. No subchorionic hemorrhage. YOLK SAC: No yolk sac. EMBRYO(<11WK) /FETUS(>=11WK): No embryo. CROWN RUMP LENGTH: Not measured. RIGHT OVARY: Unremarkable. Normal arterial and venous flow. LEFT OVARY: Unremarkable. Normal arterial and venous flow. FREE FLUID: No free fluid.  MEASUREMENTS ESTIMATED GESTATIONAL AGE BY CURRENT ULTRASOUND: 5 weeks and 2 days gestation (based on mean sac diameter of 6 mm). IMPRESSION: 1. Probable early intrauterine gestational sac, but no yolk sac, fetal pole, or cardiac activity yet visualized. Recommend follow-up quantitative B-HCG levels and follow-up US  in 14 days to assess viability. This recommendation follows SRU consensus guidelines: Diagnostic Criteria for Nonviable Pregnancy Early in the First Trimester. LOISE Diedra PARAS Med 2013; 630:8556-48. 2. Left uterine fibroid measuring 3.1 x 2.9 x 2.7 cm. Electronically signed by: Morgane Naveau MD 01/23/2024 09:06 PM  EDT RP Workstation: [id number]     ASSESSMENT: [id number] [redacted]w[redacted]d Estimated Date of Delivery: 09/16/24  Patient Active Problem List   Diagnosis Date Noted   GBS bacteriuria 01/24/2024   Genital HSV 01/24/2024   Pure hypercholesterolemia 01/23/2024   Hyperglycemia due to type 2 diabetes mellitus (HCC) 01/23/2024   Fever 01/23/2024   Hypertensive disorder 05/21/2022   Sickle cell trait 05/20/2022   Type 2 diabetes mellitus (HCC) 05/20/2022   Uterine leiomyoma 04/07/2022   Hypokalemia 08/09/2021   PP numbness/neuro sx 01/04/2012   Scoliosis     PLAN: 1) Fever unknown etiology- DDX includes primary HSV, UTI, Gastroenteritis, septic miscarriage Last temp 100.6 this am @ 0531 -currently on IV Rocephin/Vanc and valtrex -plan for repeat HCG this evening (48hr) to assess for appropriate rise in HCG to differentiate miscarriage vs normal pregnancy -WBC slightly elevated this am -HSV serology pending  2) T2DM with hyperglycemia -increase Semglee to 30u daily, per DM coordinator 40units, plan to increase to full 40u pending sugars -Novolog  6u TID with PP sliding scale -IM following -oral K added -diabetic diet as tolerated  3) CHTN -continue Labetalol 200mg  bid  4) Maternal care -ambulation as tolerated -PNV daily   DISP: Await results of tonight's HCG regarding pregnancy  status.  Continue with inpatient management as above.  Mikaelyn Arthurs M Maziyah Vessel 01/25/2024,10:09 AM

## 2024-01-25 NOTE — Inpatient Diabetes Management (Addendum)
 Inpatient Diabetes Program Recommendations  AACE/ADA: New Consensus Statement on Inpatient Glycemic Control (2015)  Target Ranges:  Prepandial:   less than 140 mg/dL      Peak postprandial:   less than 180 mg/dL (1-2 hours)      Critically ill patients:  140 - 180 mg/dL   Lab Results  Component Value Date   GLUCAP 200 (H) 01/25/2024   HGBA1C 6.5 (H) 01/23/2024    Review of Glycemic Control  Latest Reference Range & Units 01/24/24 09:10 01/24/24 10:24 01/24/24 11:22 01/24/24 14:06 01/24/24 19:38 01/24/24 22:35 01/25/24 05:44  Glucose-Capillary 70 - 99 mg/dL 885 (H) 899 (H) 877 (H) 190 (H) 170 (H) 164 (H) 200 (H)  (H): Data is abnormally high  Diabetes history: DM 2 Outpatient Diabetes medications: Freestyle Libre 3 plus, Novolog  10-20 tid, Tresiba  42 units Daily Current orders for Inpatient glycemic control:  Novolog  0-9 units tid + hs Semglee 20 units Q24 hours  Inpatient Diabetes Program Recommendations:    Might consider ordering Glycemic control Pregnancy order set:  Semglee 40 units every day Novolog  6 units TID with meals Novolog  0-14 units TID 2 hrs after meals  Addendum@13 :20: Met with patient at bedside.  Endocrinologist is Dr. Trixie with [facility].  She confirms above home insulins.  She only takes her Novolog  meal coverage if she is high per Dr. Trixie instructions.   Discussed importance of optimal glucose control during pregnancy, increased insulin  resistance as fetus grows, complications of uncontrolled glucose during pregnancy and at delivery.  Discussed goal glucose targets fasting and 2 hrs postprandial.  Discussed goal carbohydrates per meal.   Asked her to download the [employer] on her IPhone.  Will place a CGM and give her a sample to take home.    Thank you, Wyvonna Pinal, MSN, CDCES Diabetes Coordinator Inpatient Diabetes Program [phone] (team pager from 8a-5p)

## 2024-01-25 NOTE — Social Work (Signed)
 CSW received consult stating Recent diagnosis of genital HSV, significant patient distress with passive SI statements CSW met with patient to offer support and complete assessment. CSW entered the room, patient presented with flat affect resting in bed. CSW introduced self, CSW role and reason for visit, patient was first agreeable to visit.  Patient recalled being notified she has HSV and stated I should just kill myself while RN was in the room. Patient stated to CSW I did not really mean I wanted to kill myself because that is not anything to kill yourself over. Patient then stated I don't want to talk about this, I told them I didn't want to talk to you. CSW informed patient it was her right to refuse to complete the assessment and CSW asked if patient had any questions, patient shook her head no and and CSW exited the room.   CSW identifies no further need for intervention and no barriers to discharge at this time.  Cayleb Jarnigan, LCSWA Clinical Social Worker [phone]

## 2024-01-26 ENCOUNTER — Encounter (HOSPITAL_COMMUNITY): Payer: Self-pay | Admitting: Obstetrics and Gynecology

## 2024-01-26 ENCOUNTER — Inpatient Hospital Stay (HOSPITAL_COMMUNITY): Admitting: Anesthesiology

## 2024-01-26 ENCOUNTER — Encounter (HOSPITAL_COMMUNITY)
Admission: AD | Disposition: A | Discharge status: Home / Self Care | Payer: Self-pay | Source: Ambulatory Visit | Attending: Obstetrics and Gynecology

## 2024-01-26 DIAGNOSIS — Z3A01 Less than 8 weeks gestation of pregnancy: Secondary | ICD-10-CM

## 2024-01-26 DIAGNOSIS — O0387 Sepsis following complete or unspecified spontaneous abortion: Principal | ICD-10-CM

## 2024-01-26 HISTORY — PX: DILATION AND EVACUATION: SHX1459

## 2024-01-26 LAB — CBC WITH DIFFERENTIAL/PLATELET
Basophils Absolute: 0 K/uL (ref 0.0–0.1)
Basophils Relative: 0 %
Eosinophils Absolute: 0.1 K/uL (ref 0.0–0.5)
Eosinophils Relative: 2 %
HCT: 32.6 % — ABNORMAL LOW (ref 36.0–46.0)
Hemoglobin: 11.8 g/dL — ABNORMAL LOW (ref 12.0–15.0)
Lymphocytes Relative: 18 %
Lymphs Abs: 1.1 K/uL (ref 0.7–4.0)
MCH: 29.1 pg (ref 26.0–34.0)
MCHC: 36.2 g/dL — ABNORMAL HIGH (ref 30.0–36.0)
MCV: 80.3 fL (ref 80.0–100.0)
Monocytes Absolute: 0.4 K/uL (ref 0.1–1.0)
Monocytes Relative: 6 %
Neutro Abs: 4.7 K/uL (ref 1.7–7.7)
Neutrophils Relative %: 74 %
Platelets: 132 K/uL — ABNORMAL LOW (ref 150–400)
RBC: 4.06 MIL/uL (ref 3.87–5.11)
RDW: 14.1 % (ref 11.5–15.5)
WBC: 6.3 K/uL (ref 4.0–10.5)
nRBC: 0 % (ref 0.0–0.2)

## 2024-01-26 LAB — COMPREHENSIVE METABOLIC PANEL WITH GFR
ALT: 41 U/L (ref 0–44)
AST: 34 U/L (ref 15–41)
Albumin: 2.6 g/dL — ABNORMAL LOW (ref 3.5–5.0)
Alkaline Phosphatase: 44 U/L (ref 38–126)
Anion gap: 10 (ref 5–15)
BUN: <5 mg/dL — ABNORMAL LOW (ref 6–20)
CO2: 19 mmol/L — ABNORMAL LOW (ref 22–32)
Calcium: 7.7 mg/dL — ABNORMAL LOW (ref 8.9–10.3)
Chloride: 108 mmol/L (ref 98–111)
Creatinine, Ser: 0.64 mg/dL (ref 0.44–1.00)
GFR, Estimated: >60 mL/min (ref >60)
Glucose, Bld: 115 mg/dL — ABNORMAL HIGH (ref 70–99)
Potassium: 3.3 mmol/L — ABNORMAL LOW (ref 3.5–5.1)
Sodium: 137 mmol/L (ref 135–145)
Total Bilirubin: 0.6 mg/dL (ref 0.0–1.2)
Total Protein: 5.5 g/dL — ABNORMAL LOW (ref 6.5–8.1)

## 2024-01-26 LAB — HSV(HERPES SIMPLEX VRS) I + II AB-IGG
HSV 1 Glycoprotein G Ab, IgG: NONREACTIVE
HSV 2 Glycoprotein G Ab, IgG: NONREACTIVE

## 2024-01-26 LAB — TYPE AND SCREEN
ABO/RH(D): O POS
Antibody Screen: NEGATIVE

## 2024-01-26 LAB — GLUCOSE, CAPILLARY
Glucose-Capillary: 123 mg/dL — ABNORMAL HIGH (ref 70–99)
Glucose-Capillary: 106 mg/dL — ABNORMAL HIGH (ref 70–99)
Glucose-Capillary: 125 mg/dL — ABNORMAL HIGH (ref 70–99)

## 2024-01-26 SURGERY — DILATION AND EVACUATION, UTERUS
Anesthesia: General

## 2024-01-26 MED ORDER — FENTANYL CITRATE (PF) 100 MCG/2ML IJ SOLN
INTRAMUSCULAR | Status: AC
  Filled 2024-01-26: qty 2

## 2024-01-26 MED ORDER — PROPOFOL 10 MG/ML IV BOLUS
INTRAVENOUS | Status: DC | PRN
  Administered 2024-01-26: 150 mg via INTRAVENOUS

## 2024-01-26 MED ORDER — MISOPROSTOL 200 MCG PO TABS
ORAL_TABLET | ORAL | Status: AC
Start: 2024-01-26 — End: 2024-01-26
  Filled 2024-01-26: qty 5

## 2024-01-26 MED ORDER — ORAL CARE MOUTH RINSE: 15.0000 mL | Freq: Once | OROMUCOSAL | Status: AC

## 2024-01-26 MED ORDER — PROPOFOL 10 MG/ML IV BOLUS
INTRAVENOUS | Status: AC
  Filled 2024-01-26: qty 20

## 2024-01-26 MED ORDER — TRANEXAMIC ACID-NACL 1000-0.7 MG/100ML-% IV SOLN
INTRAVENOUS | Status: AC
  Filled 2024-01-26: qty 100

## 2024-01-26 MED ORDER — INSULIN GLARGINE-YFGN 100 UNIT/ML ~~LOC~~ SOLN
25.0000 [IU] | SUBCUTANEOUS | Status: DC
  Filled 2024-01-26: qty 0.25

## 2024-01-26 MED ORDER — IBUPROFEN 600 MG PO TABS
600.0000 mg | ORAL_TABLET | Freq: Four times a day (QID) | ORAL | Status: DC | PRN
Start: 2024-01-26 — End: 2024-01-27
  Administered 2024-01-26: 600 mg via ORAL
  Filled 2024-01-26: qty 1

## 2024-01-26 MED ORDER — BUPIVACAINE-EPINEPHRINE 0.5% -1:200000 IJ SOLN
INTRAMUSCULAR | Status: DC | PRN
  Administered 2024-01-26: 20 mL

## 2024-01-26 MED ORDER — INSULIN GLARGINE-YFGN 100 UNIT/ML ~~LOC~~ SOLN: 25.0000 [IU] | SUBCUTANEOUS | Status: DC

## 2024-01-26 MED ORDER — LACTATED RINGERS IV SOLN: INTRAVENOUS | Status: DC

## 2024-01-26 MED ORDER — MIDAZOLAM HCL 2 MG/2ML IJ SOLN
INTRAMUSCULAR | Status: AC
  Filled 2024-01-26: qty 2

## 2024-01-26 MED ORDER — LIDOCAINE HCL (CARDIAC) PF 100 MG/5ML IV SOSY
PREFILLED_SYRINGE | INTRAVENOUS | Status: DC | PRN
  Administered 2024-01-26: 60 mg via INTRAVENOUS

## 2024-01-26 MED ORDER — CHLORHEXIDINE GLUCONATE 0.12 % MT SOLN: 15.0000 mL | Freq: Once | OROMUCOSAL | Status: AC

## 2024-01-26 MED ORDER — DEXAMETHASONE SOD PHOSPHATE PF 10 MG/ML IJ SOLN
INTRAMUSCULAR | Status: DC | PRN
  Administered 2024-01-26: 4 mg via INTRAVENOUS

## 2024-01-26 MED ORDER — CHLORHEXIDINE GLUCONATE 0.12 % MT SOLN
OROMUCOSAL | Status: AC
  Administered 2024-01-26: 15 mL via OROMUCOSAL
  Filled 2024-01-26: qty 15

## 2024-01-26 MED ORDER — FENTANYL CITRATE (PF) 100 MCG/2ML IJ SOLN
INTRAMUSCULAR | Status: DC | PRN
  Administered 2024-01-26: 100 ug via INTRAVENOUS
  Administered 2024-01-26: 50 ug via INTRAVENOUS

## 2024-01-26 MED ORDER — VANCOMYCIN HCL 750 MG IV SOLR
750.0000 mg | Freq: Two times a day (BID) | INTRAVENOUS | Status: DC
  Filled 2024-01-26 (×2): qty 15

## 2024-01-26 MED ORDER — METHYLERGONOVINE MALEATE 0.2 MG/ML IJ SOLN
INTRAMUSCULAR | Status: AC
  Filled 2024-01-26: qty 1

## 2024-01-26 MED ORDER — CARBOPROST TROMETHAMINE 250 MCG/ML IM SOLN
INTRAMUSCULAR | Status: AC
Start: 2024-01-26 — End: 2024-01-26
  Filled 2024-01-26: qty 1

## 2024-01-26 SURGICAL SUPPLY — 21 items
CATH ROBINSON RED A/P 16FR (CATHETERS) ×1 IMPLANT
COVER MAYO STAND STRL (DRAPES) ×1 IMPLANT
FILTER UTR ASPR ASSEMBLY (MISCELLANEOUS) ×1 IMPLANT
GAUZE 4X4 16PLY ~~LOC~~+RFID DBL (SPONGE) ×1 IMPLANT
GLOVE BIO SURGEON STRL SZ 6.5 (GLOVE) ×1 IMPLANT
GLOVE BIOGEL PI IND STRL 7.0 (GLOVE) ×2 IMPLANT
GOWN STRL REUS W/ TWL LRG LVL3 (GOWN DISPOSABLE) ×2 IMPLANT
HOSE CONNECTING 18IN BERKELEY (TUBING) ×1 IMPLANT
KIT BERKELEY 1ST TRI 3/8 NO TR (MISCELLANEOUS) ×1 IMPLANT
KIT BERKELEY 1ST TRIMESTER 3/8 (MISCELLANEOUS) ×1 IMPLANT
PACK VAGINAL MINOR WOMEN LF (CUSTOM PROCEDURE TRAY) ×1 IMPLANT
PAD OB MATERNITY 11 LF (PERSONAL CARE ITEMS) ×1 IMPLANT
SET BERKELEY SUCTION TUBING (SUCTIONS) ×1 IMPLANT
SOLN 0.9% NACL POUR BTL 1000ML (IV SOLUTION) ×1 IMPLANT
SPIKE FLUID TRANSFER (MISCELLANEOUS) ×1 IMPLANT
TOWEL GREEN STERILE FF (TOWEL DISPOSABLE) ×2 IMPLANT
UNDERPAD 30X36 HEAVY ABSORB (UNDERPADS AND DIAPERS) ×1 IMPLANT
VACURETTE 10 RIGID CVD (CANNULA) IMPLANT
VACURETTE 7MM CVD STRL WRAP (CANNULA) IMPLANT
VACURETTE 8 RIGID CVD (CANNULA) IMPLANT
VACURETTE 9 RIGID CVD (CANNULA) IMPLANT

## 2024-01-26 NOTE — Op Note (Signed)
 Preoperative diagnosis: Septic AB  Postoperative diagnosis: Same  Anesthesia: General and paracervical block  Procedure: Dilatation and evacuation  Surgeon: Dr. Delon Prude  Estimated blood loss: 25cc UOP: 200cc IVF: 400cc  Procedure:  Pt was taken to the OR where general anesthesia was performed.  She was placed in lithotomy position.  She was prepped and draped in a sterile fashion.  The bladder was drained using a red rubber.  A sterile speculum is inserted in the vagina and the anterior lip of the cervix was grasped with a tenaculum forcep. Paracervical block was completed using 20 cc of 0.5% Lidocaine  with epi. The uterus was then sounded at 8 cm. The cervix was easily dilated using Hegar dilators. Using a #7 curved cannula, evacuation of products of conception was completed without difficulty. Due to concern for inadequate removal a #9 curved cannula was also used.  Sharp curettage of the uterine cavity was performed.  Bedside US  performed and confirmed complete evacuation.  Instruments were then removed. Instrument and sponge count is complete x2.  The procedure is very well tolerated by the patient is taken to recovery room in a well and stable condition.  Specimen: Products of conception sent to pathology

## 2024-01-26 NOTE — Anesthesia Preprocedure Evaluation (Addendum)
 Anesthesia Evaluation  Patient identified by MRN, date of birth, ID band Patient awake    Reviewed: Allergy & Precautions, NPO status , Patient's Chart, lab work & pertinent test results  History of Anesthesia Complications Negative for: history of anesthetic complications  Airway Mallampati: III  TM Distance: >3 FB Neck ROM: Full    Dental  (+) Dental Advisory Given, Teeth Intact   Pulmonary neg pulmonary ROS   Pulmonary exam normal        Cardiovascular hypertension, Pt. on medications Normal cardiovascular exam     Neuro/Psych negative neurological ROS  negative psych ROS   GI/Hepatic negative GI ROS, Neg liver ROS,,,  Endo/Other  diabetes, Type 2, Oral Hypoglycemic Agents, Insulin  Dependent   Obesity   Renal/GU negative Renal ROS     Musculoskeletal negative musculoskeletal ROS (+)    Abdominal   Peds  Hematology  (+) Blood dyscrasia, Sickle cell trait and anemia  Plt 132k    Anesthesia Other Findings HSV  Reproductive/Obstetrics                              Anesthesia Physical Anesthesia Plan  ASA: 2  Anesthesia Plan: General   Post-op Pain Management: Minimal or no pain anticipated and Tylenol  PO (pre-op)*   Induction: Intravenous  PONV Risk Score and Plan: 3 and Treatment may vary due to age or medical condition, Ondansetron , Dexamethasone and Midazolam  Airway Management Planned: LMA  Additional Equipment: None  Intra-op Plan:   Post-operative Plan: Extubation in OR  Informed Consent: I have reviewed the patients History and Physical, chart, labs and discussed the procedure including the risks, benefits and alternatives for the proposed anesthesia with the patient or authorized representative who has indicated his/her understanding and acceptance.     Dental advisory given  Plan Discussed with: CRNA and Anesthesiologist  Anesthesia Plan Comments:           Anesthesia Quick Evaluation

## 2024-01-26 NOTE — Inpatient Diabetes Management (Signed)
 Inpatient Diabetes Program Recommendations  AACE/ADA: New Consensus Statement on Inpatient Glycemic Control (2015)  Target Ranges:  Prepandial:   less than 140 mg/dL      Peak postprandial:   less than 180 mg/dL (1-2 hours)      Critically ill patients:  140 - 180 mg/dL   Lab Results  Component Value Date   GLUCAP 125 (H) 01/26/2024   HGBA1C 6.5 (H) 01/23/2024    Review of Glycemic Control  Latest Reference Range & Units 01/25/24 05:44 01/25/24 10:09 01/25/24 12:44 01/25/24 14:56 01/25/24 15:30 01/25/24 22:05 01/26/24 06:36 01/26/24 11:33  Glucose-Capillary 70 - 99 mg/dL 799 (H) 829 (H) 848 (H) 65 (L) 80 123 (H) 106 (H) 125 (H)  (H): Data is abnormally high (L): Data is abnormally low  Diabetes history: DM 2 Outpatient Diabetes medications: Freestyle Libre 3 plus, Novolog  10-20 tid, Tresiba  42 units Daily Current orders for Inpatient glycemic control:  Novolog  0-9 units Q4H Semglee 25 units Q24 hours   Inpatient Diabetes Program Recommendations:    Patient received Decadron 4 mg today for procedure.     Might consider ordering Glycemic control Pregnancy order set which was ordered prior to OR.    Semglee 30 units every day Novolog  6 units TID with meals Novolog  0-14 units TID 2 hrs after meals  Thank you, Wyvonna Pinal, MSN, CDCES Diabetes Coordinator Inpatient Diabetes Program [phone] (team pager from 8a-5p)

## 2024-01-26 NOTE — Transfer of Care (Signed)
 Immediate Anesthesia Transfer of Care Note  Patient: Allison Mathews  Procedure(s) Performed: DILATION AND EVACUATION, UTERUS  Patient Location: PACU  Anesthesia Type:General  Level of Consciousness: awake, drowsy, and patient cooperative  Airway & Oxygen Therapy: Patient Spontanous Breathing  Post-op Assessment: Report given to RN, Post -op Vital signs reviewed and stable, and Patient moving all extremities X 4  Post vital signs: Reviewed and stable  Last Vitals:  Vitals Value Taken Time  BP 120/83 01/26/24 11:30  Temp    Pulse 88 01/26/24 11:31  Resp 22 01/26/24 11:31  SpO2 89 % 01/26/24 11:31  Vitals shown include unfiled device data.  Last Pain:  Vitals:   01/26/24 1002  TempSrc:   PainSc: 0-No pain         Complications: No notable events documented.

## 2024-01-26 NOTE — Anesthesia Postprocedure Evaluation (Signed)
 Anesthesia Post Note  Patient: Mckaylie Vasey Fronek  Procedure(s) Performed: DILATION AND EVACUATION, UTERUS     Patient location during evaluation: PACU Anesthesia Type: General Level of consciousness: awake and alert Pain management: pain level controlled Vital Signs Assessment: post-procedure vital signs reviewed and stable Respiratory status: spontaneous breathing, nonlabored ventilation and respiratory function stable Cardiovascular status: stable and blood pressure returned to baseline Anesthetic complications: no   No notable events documented.  Last Vitals:  Vitals:   01/26/24 1229 01/26/24 1231  BP:    Pulse:    Resp:    Temp:    SpO2: 91% 97%    Last Pain:  Vitals:   01/26/24 1231  TempSrc:   PainSc: Asleep                 Debby FORBES Like

## 2024-01-26 NOTE — Anesthesia Procedure Notes (Signed)
 Procedure Name: LMA Insertion Date/Time: 01/26/2024 10:52 AM  Performed by: Cordella Elvie HERO, CRNAPre-anesthesia Checklist: Patient identified, Emergency Drugs available, Suction available, Patient being monitored and Timeout performed Patient Re-evaluated:Patient Re-evaluated prior to induction Oxygen Delivery Method: Circle system utilized Preoxygenation: Pre-oxygenation with 100% oxygen Induction Type: IV induction LMA: LMA inserted LMA Size: 4.0 Number of attempts: 1 Placement Confirmation: positive ETCO2, CO2 detector and breath sounds checked- equal and bilateral Tube secured with: Tape Dental Injury: Teeth and Oropharynx as per pre-operative assessment

## 2024-01-26 NOTE — Progress Notes (Signed)
 [facility]                                                                               [employer], is a 33 y.o. female, DOB - Sep 29, 1990, FMW:[id number] Admit date - 01/23/2024    Outpatient Primary MD for the patient is Chrystal Lamarr RAMAN, MD  LOS - 3  days    Brief summary   Allison Mathews is an 33 y.o. female with past medical history of insulin -dependent type 2 diabetes, hypertension, hyperlipidemia, sickle cell trait, anemia, G4P2012 at [redacted]w[redacted]d presented to [facility] for lab abnormalities noted by her PCP.  Patient reportedly seen at urgent care earlier this week for dysuria and urinary frequency.  They dropped her urine sample so did not test it but started on empiric antibiotics 2 days ago.  Patient saw her PCP yesterday for malaise and feeling hot.  She had outpatient labs done which showed hyperglycemia with elevated anion gap, elevated liver enzymes, and low potassium so she was sent to MICU.    Assessment & Plan    Assessment and Plan:    Sepsis: ? Septic abortion.  COVID, respiratory panel and influenza are negative. She denies any respiratory symptoms.  C diff pcr is negative. Lactate normal.  GI panel is negative.  Blood cultures are negative.  UA Showing small leukocytosis , rare bacteria.  She denies any headache, neck pain .  HSV 2 DNA is positive. GC/CT negative.  She was started on broad spectrum IV antibiotics. Continue IV antibiotics for another 48 hours.  S/p  dilatation and evacuation today.     Early DKA:  Hyperglycemia  Resolved.    Uncontrolled DM with hypoglycemia Insulin  dependent type 2 DM.  Labile cbg's. Started the patient on Semglee 25 units and check cbg's every 4 hours.  CBG (last 3)  Recent Labs    01/25/24 1530 01/25/24 2205 01/26/24 0636  GLUCAP 80 123* 106*   No change in meds.    Hypokalemia and hypomagnesemia Replaced. Repeat levels in am.    Mild thrombocytopenia.  Stable.    Mildly  elevated transaminases Suspect from sepsis.repeat levels wnl.    Hypertension BP parameters are well controlled.   HSV 2 Infection Started her valacyclovir.   Estimated body mass index is 32.76 kg/m as calculated from the following:   Height as of 10/26/23: 4' 11 (1.499 m).   Weight as of this encounter: 73.6 kg.  Code Status: full code.  DVT Prophylaxis:  SCDs Start: 01/23/24 2345   Level of Care: Level of care: Antepartum Family Communication: none at bedside.   Disposition Plan:     Remains inpatient appropriate:  pending clinical improvement.    Antimicrobials:   Anti-infectives (From admission, onward)    Start     Dose/Rate Route Frequency Ordered Stop   01/25/24 2200  vancomycin (VANCOCIN) 750 mg in sodium chloride  0.9 % 250 mL IVPB  Status:  Discontinued        750 mg 265 mL/hr over 60 Minutes Intravenous Every 12 hours 01/25/24 1707 01/25/24 1713   01/25/24 2200  vancomycin (VANCOCIN) 750 mg in sodium chloride  0.9 % 250 mL IVPB  750 mg 250 mL/hr over 60 Minutes Intravenous Every 12 hours 01/25/24 1716     01/24/24 2300  valACYclovir (VALTREX) tablet 1,000 mg        1,000 mg Oral 2 times daily 01/24/24 2207 01/31/24 2159   01/24/24 1400  metroNIDAZOLE (FLAGYL) IVPB 500 mg  Status:  Discontinued        500 mg 100 mL/hr over 60 Minutes Intravenous Every 12 hours 01/24/24 0003 01/24/24 2258   01/24/24 1200  vancomycin (VANCOCIN) IVPB 750 mg/150 ml premix  Status:  Discontinued        750 mg 75 mL/hr over 120 Minutes Intravenous Every 12 hours 01/24/24 0003 01/24/24 0700   01/24/24 1000  vancomycin (VANCOCIN) 750 mg in sodium chloride  0.9 % 250 mL IVPB  Status:  Discontinued        750 mg 265 mL/hr over 60 Minutes Intravenous Every 12 hours 01/23/24 2115 01/23/24 2348   01/24/24 1000  vancomycin (VANCOCIN) 750 mg in sodium chloride  0.9 % 250 mL IVPB  Status:  Discontinued        750 mg 265 mL/hr over 60 Minutes Intravenous Every 12 hours 01/24/24 0700  01/25/24 1706   01/24/24 0630  ceFEPIme (MAXIPIME) 2 g in sodium chloride  0.9 % 100 mL IVPB        2 g 200 mL/hr over 30 Minutes Intravenous Every 8 hours 01/24/24 0003     01/24/24 0600  ceFEPIme (MAXIPIME) 2 g in sodium chloride  0.9 % 100 mL IVPB  Status:  Discontinued        2 g 200 mL/hr over 30 Minutes Intravenous Every 8 hours 01/23/24 2117 01/23/24 2348   01/23/24 2000  ceFEPIme (MAXIPIME) 2 g in sodium chloride  0.9 % 100 mL IVPB        2 g 200 mL/hr over 30 Minutes Intravenous  Once 01/23/24 1943 01/23/24 2245   01/23/24 2000  metroNIDAZOLE (FLAGYL) IVPB 500 mg  Status:  Discontinued        500 mg 100 mL/hr over 60 Minutes Intravenous Every 12 hours 01/23/24 1943 01/23/24 2348   01/23/24 2000  vancomycin (VANCOCIN) 1,500 mg in sodium chloride  0.9 % 500 mL IVPB  Status:  Discontinued        1,500 mg 257.5 mL/hr over 120 Minutes Intravenous  Once 01/23/24 1943 01/23/24 1958   01/23/24 2000  Vancomycin (VANCOCIN) 1,250 mg in sodium chloride  0.9 % 250 mL IVPB        1,250 mg 166.7 mL/hr over 90 Minutes Intravenous  Once 01/23/24 1958 01/23/24 2332        Medications  Scheduled Meds:  insulin  aspart  0-9 Units Subcutaneous Q4H   insulin  glargine-yfgn  25 Units Subcutaneous Q24H   labetalol  200 mg Oral BID   potassium chloride   20 mEq Oral Daily   prenatal multivitamin  1 tablet Oral Q1200   valACYclovir  1,000 mg Oral BID   Continuous Infusions:  ceFEPime (MAXIPIME) IV 2 g (01/26/24 0041)   lactated ringers  75 mL/hr at 01/25/24 2336   vancomycin (VANCOCIN) 750 mg in sodium chloride  0.9 % 250 mL IVPB 250 mL/hr at 01/25/24 2336   PRN Meds:.acetaminophen , calcium  carbonate, dextrose , diphenhydrAMINE , loperamide, ondansetron  (ZOFRAN ) IV    Subjective:   Allison Mathews was seen and examined today.  No new complaints today. .   Objective:   Vitals:   01/25/24 2032 01/26/24 0006 01/26/24 0201 01/26/24 0550  BP: (!) 148/85 116/73 130/73 (!) 150/85  Pulse: 99 83  90 84   Resp: 16 16 16 16   Temp: 98.3 F (36.8 C) 98 F (36.7 C) 97.6 F (36.4 C) 98.3 F (36.8 C)  TempSrc: Oral Oral Oral Oral  SpO2:   98%   Weight:        Intake/Output Summary (Last 24 hours) at 01/26/2024 0851 Last data filed at 01/26/2024 0100 Gross per 24 hour  Intake 824.63 ml  Output 1100 ml  Net -275.37 ml   Filed Weights   01/23/24 1837  Weight: 73.6 kg     Exam General exam: Appears calm and comfortable  Respiratory system: Clear to auscultation. Respiratory effort normal. Cardiovascular system: S1 & S2 heard, RRR.  Gastrointestinal system: Abdomen is nondistended, soft and nontender.  Central nervous system: Alert and oriented. No focal neurological deficits. Extremities: Symmetric 5 x 5 power. Skin: No rashes,  Psychiatry: flat affect     Data Reviewed:  I have personally reviewed following labs and imaging studies   CBC Lab Results  Component Value Date   WBC 6.3 01/26/2024   RBC 4.06 01/26/2024   HGB 11.8 (L) 01/26/2024   HCT 32.6 (L) 01/26/2024   MCV 80.3 01/26/2024   MCH 29.1 01/26/2024   PLT 132 (L) 01/26/2024   MCHC 36.2 (H) 01/26/2024   RDW 14.1 01/26/2024   LYMPHSABS 1.1 01/26/2024   MONOABS 0.4 01/26/2024   EOSABS 0.1 01/26/2024   BASOSABS 0.0 01/26/2024     Last metabolic panel Lab Results  Component Value Date   NA 137 01/26/2024   K 3.3 (L) 01/26/2024   CL 108 01/26/2024   CO2 19 (L) 01/26/2024   BUN <5 (L) 01/26/2024   CREATININE 0.64 01/26/2024   GLUCOSE 115 (H) 01/26/2024   GFRNONAA >60 01/26/2024   GFRAA >60 08/31/2017   CALCIUM  7.7 (L) 01/26/2024   PROT 5.5 (L) 01/26/2024   ALBUMIN 2.6 (L) 01/26/2024   BILITOT 0.6 01/26/2024   ALKPHOS 44 01/26/2024   AST 34 01/26/2024   ALT 41 01/26/2024   ANIONGAP 10 01/26/2024    CBG (last 3)  Recent Labs    01/25/24 1530 01/25/24 2205 01/26/24 0636  GLUCAP 80 123* 106*      Coagulation Profile: Recent Labs  Lab 01/23/24 2018  INR 1.1     Radiology  Studies: No results found.      Elgie Butter M.D. [facility] 01/26/2024, 8:51 AM  Available via Epic secure chat 7am-7pm After 7 pm, please refer to night coverage provider listed on amion.

## 2024-01-26 NOTE — Progress Notes (Signed)
 FACULTY PRACTICE ANTEPARTUM(COMPREHENSIVE) NOTE  Allison Mathews is a 33 y.o. [id number] with Estimated Date of Delivery: 09/16/24  who is admitted for fever of unknown etiology.    Subjective: Pt noted vaginal bleeding and mild pelvic pain.  Otherwise still some nausea/decreased appetite.  No acute changes overnight.  Vitals:  Blood pressure 124/78, pulse 81, temperature 98.4 F (36.9 C), temperature source Oral, resp. rate 20, height 4' 11 (1.499 m), weight 73.6 kg, last menstrual period 12/11/2023, SpO2 96%. Vitals:   01/26/24 0201 01/26/24 0550 01/26/24 0907 01/26/24 0948  BP: 130/73 (!) 150/85 134/75 124/78  Pulse: 90 84 82 81  Resp: 16 16  20   Temp: 97.6 F (36.4 C) 98.3 F (36.8 C) 98.3 F (36.8 C) 98.4 F (36.9 C)  TempSrc: Oral Oral Oral Oral  SpO2: 98%  99% 96%  Weight:    73.6 kg  Height:    4' 11 (1.499 m)   Physical Examination:  General appearance - alert, well appearing, and in no distress Mental status - normal mood, behavior, speech, dress, motor activity, and thought processes Chest - CTAB, normal respiratory effort Heart - normal rate and regular rhythm Abdomen - soft and non-tender Musculoskeletal - no calf tenderness bilaterally Extremities - no edema Skin - warm and dry   Labs:  Results for orders placed or performed during the hospital encounter of 01/23/24 (from the past 24 hours)  Glucose, capillary   Collection Time: 01/25/24 12:44 PM  Result Value Ref Range   Glucose-Capillary 151 (H) 70 - 99 mg/dL  Glucose, capillary   Collection Time: 01/25/24  2:56 PM  Result Value Ref Range   Glucose-Capillary 65 (L) 70 - 99 mg/dL  Glucose, capillary   Collection Time: 01/25/24  3:30 PM  Result Value Ref Range   Glucose-Capillary 80 70 - 99 mg/dL  hCG, quantitative, pregnancy   Collection Time: 01/25/24  8:27 PM  Result Value Ref Range   hCG, Beta Chain, Quant, S 210 (H) <5 mIU/mL  Glucose, capillary   Collection Time: 01/25/24 10:05 PM  Result  Value Ref Range   Glucose-Capillary 123 (H) 70 - 99 mg/dL  CBC with Differential/Platelet   Collection Time: 01/26/24  4:38 AM  Result Value Ref Range   WBC 6.3 4.0 - 10.5 K/uL   RBC 4.06 3.87 - 5.11 MIL/uL   Hemoglobin 11.8 (L) 12.0 - 15.0 g/dL   HCT 67.3 (L) 63.9 - 53.9 %   MCV 80.3 80.0 - 100.0 fL   MCH 29.1 26.0 - 34.0 pg   MCHC 36.2 (H) 30.0 - 36.0 g/dL   RDW 85.8 88.4 - 84.4 %   Platelets 132 (L) 150 - 400 K/uL   nRBC 0.0 0.0 - 0.2 %   Neutrophils Relative % 74 %   Neutro Abs 4.7 1.7 - 7.7 K/uL   Lymphocytes Relative 18 %   Lymphs Abs 1.1 0.7 - 4.0 K/uL   Monocytes Relative 6 %   Monocytes Absolute 0.4 0.1 - 1.0 K/uL   Eosinophils Relative 2 %   Eosinophils Absolute 0.1 0.0 - 0.5 K/uL   Basophils Relative 0 %   Basophils Absolute 0.0 0.0 - 0.1 K/uL   WBC Morphology See Note    RBC Morphology MORPHOLOGY UNREMARKABLE    Smear Review See Note   Comprehensive metabolic panel   Collection Time: 01/26/24  4:38 AM  Result Value Ref Range   Sodium 137 135 - 145 mmol/L   Potassium 3.3 (L) 3.5 - 5.1 mmol/L  Chloride 108 98 - 111 mmol/L   CO2 19 (L) 22 - 32 mmol/L   Glucose, Bld 115 (H) 70 - 99 mg/dL   BUN <5 (L) 6 - 20 mg/dL   Creatinine, Ser 9.35 0.44 - 1.00 mg/dL   Calcium  7.7 (L) 8.9 - 10.3 mg/dL   Total Protein 5.5 (L) 6.5 - 8.1 g/dL   Albumin 2.6 (L) 3.5 - 5.0 g/dL   AST 34 15 - 41 U/L   ALT 41 0 - 44 U/L   Alkaline Phosphatase 44 38 - 126 U/L   Total Bilirubin 0.6 0.0 - 1.2 mg/dL   GFR, Estimated >39 >39 mL/min   Anion gap 10 5 - 15  Glucose, capillary   Collection Time: 01/26/24  6:36 AM  Result Value Ref Range   Glucose-Capillary 106 (H) 70 - 99 mg/dL    Imaging Studies:    US  Abdomen Limited RUQ (LIVER/GB) Result Date: 01/24/2024 CLINICAL DATA:  Elevated liver enzymes. EXAM: ULTRASOUND ABDOMEN LIMITED RIGHT UPPER QUADRANT COMPARISON:  CT scan 04/07/2022 FINDINGS: Gallbladder: No gallstones or gallbladder wall thickening. No pericholecystic fluid. The  sonographer reports no sonographic Murphy's sign. Common bile duct: Diameter: 2 mm Liver: No focal lesion identified. Within normal limits in parenchymal echogenicity. Portal vein is patent on color Doppler imaging with normal direction of blood flow towards the liver. Other: None. IMPRESSION: Unremarkable right upper quadrant ultrasound. Electronically Signed   By: Camellia Candle M.D.   On: 01/24/2024 07:50   US  OB LESS THAN 14 WEEKS WITH OB TRANSVAGINAL Result Date: 01/23/2024 EXAM: OBSTETRIC ULTRASOUND FIRST TRIMESTER TECHNIQUE: Transvaginal first trimester obstetric pelvic duplex ultrasound was performed with real-time imaging, color flow Doppler imaging, and spectral analysis. COMPARISON: CT abdomen and pelvis 04/07/2022. CLINICAL HISTORY: Pregnancy, location unknown [phone]. last menstrual: 12/11/2023. Gestational age by last menstrual: 6 weeks and 1 day. Estimated due date: 09/16/2024 FINDINGS: UTERUS: 3.1 x 2.9 x 2.7 cm left uterine fibroid. GESTATIONAL SAC(S): Single intrauterine gestational sac within the endometrium. No subchorionic hemorrhage. YOLK SAC: No yolk sac. EMBRYO(<11WK) /FETUS(>=11WK): No embryo. CROWN RUMP LENGTH: Not measured. RIGHT OVARY: Unremarkable. Normal arterial and venous flow. LEFT OVARY: Unremarkable. Normal arterial and venous flow. FREE FLUID: No free fluid. MEASUREMENTS ESTIMATED GESTATIONAL AGE BY CURRENT ULTRASOUND: 5 weeks and 2 days gestation (based on mean sac diameter of 6 mm). IMPRESSION: 1. Probable early intrauterine gestational sac, but no yolk sac, fetal pole, or cardiac activity yet visualized. Recommend follow-up quantitative B-HCG levels and follow-up US  in 14 days to assess viability. This recommendation follows SRU consensus guidelines: Diagnostic Criteria for Nonviable Pregnancy Early in the First Trimester. LOISE Diedra PARAS Med 2013; 630:8556-48. 2. Left uterine fibroid measuring 3.1 x 2.9 x 2.7 cm. Electronically signed by: Morgane Naveau MD 01/23/2024 09:06 PM EDT  RP Workstation: [id number]      ASSESSMENT: [id number] [redacted]w[redacted]d Estimated Date of Delivery: 09/16/24  Patient Active Problem List   Diagnosis Date Noted   GBS bacteriuria 01/24/2024   Genital HSV 01/24/2024   Pure hypercholesterolemia 01/23/2024   Hyperglycemia due to type 2 diabetes mellitus (HCC) 01/23/2024   Fever 01/23/2024   Hypertensive disorder 05/21/2022   Sickle cell trait 05/20/2022   Type 2 diabetes mellitus (HCC) 05/20/2022   Uterine leiomyoma 04/07/2022   Hypokalemia 08/09/2021   PP numbness/neuro sx 01/04/2012   Scoliosis     PLAN: 1) Suspect fever likely due to septic AB -plan for D&E today -NPO -Risk/benefit reviewed including but not limited to risk of bleeding, infection and  injury such as perforation requiring further surgical intervention.  Questions/concerns were addressed and pt desires to proceed -OR notified -continue IV antibiotics  Corrine Tillis, DO Attending Obstetrician & Gynecologist, [facility] [employer], [facility]

## 2024-01-27 ENCOUNTER — Encounter (HOSPITAL_COMMUNITY): Payer: Self-pay

## 2024-01-27 ENCOUNTER — Encounter: Payer: Self-pay | Admitting: Obstetrics & Gynecology

## 2024-01-27 ENCOUNTER — Other Ambulatory Visit (HOSPITAL_COMMUNITY): Payer: Self-pay

## 2024-01-27 LAB — CBC WITH DIFFERENTIAL/PLATELET
Abs Immature Granulocytes: 0.04 K/uL (ref 0.00–0.07)
Basophils Absolute: 0 K/uL (ref 0.0–0.1)
Basophils Relative: 0 %
Eosinophils Absolute: 0 K/uL (ref 0.0–0.5)
Eosinophils Relative: 0 %
HCT: 30.9 % — ABNORMAL LOW (ref 36.0–46.0)
Hemoglobin: 11.1 g/dL — ABNORMAL LOW (ref 12.0–15.0)
Immature Granulocytes: 1 %
Lymphocytes Relative: 27 %
Lymphs Abs: 1.8 K/uL (ref 0.7–4.0)
MCH: 28.7 pg (ref 26.0–34.0)
MCHC: 35.9 g/dL (ref 30.0–36.0)
MCV: 79.8 fL — ABNORMAL LOW (ref 80.0–100.0)
Monocytes Absolute: 0.7 K/uL (ref 0.1–1.0)
Monocytes Relative: 11 %
Neutro Abs: 4.1 K/uL (ref 1.7–7.7)
Neutrophils Relative %: 61 %
Platelets: 161 K/uL (ref 150–400)
RBC: 3.87 MIL/uL (ref 3.87–5.11)
RDW: 13.9 % (ref 11.5–15.5)
Smear Review: NORMAL
WBC: 6.7 K/uL (ref 4.0–10.5)
nRBC: 0 % (ref 0.0–0.2)

## 2024-01-27 LAB — COMPREHENSIVE METABOLIC PANEL WITH GFR
ALT: 38 U/L (ref 0–44)
AST: 34 U/L (ref 15–41)
Albumin: 2.5 g/dL — ABNORMAL LOW (ref 3.5–5.0)
Alkaline Phosphatase: 44 U/L (ref 38–126)
Anion gap: 9 (ref 5–15)
BUN: 5 mg/dL — ABNORMAL LOW (ref 6–20)
CO2: 18 mmol/L — ABNORMAL LOW (ref 22–32)
Calcium: 7.6 mg/dL — ABNORMAL LOW (ref 8.9–10.3)
Chloride: 112 mmol/L — ABNORMAL HIGH (ref 98–111)
Creatinine, Ser: 0.51 mg/dL (ref 0.44–1.00)
GFR, Estimated: >60 mL/min (ref >60)
Glucose, Bld: 104 mg/dL — ABNORMAL HIGH (ref 70–99)
Potassium: 3.5 mmol/L (ref 3.5–5.1)
Sodium: 139 mmol/L (ref 135–145)
Total Bilirubin: 0.5 mg/dL (ref 0.0–1.2)
Total Protein: 5.5 g/dL — ABNORMAL LOW (ref 6.5–8.1)

## 2024-01-27 LAB — MAGNESIUM: Magnesium: 2.1 mg/dL (ref 1.7–2.4)

## 2024-01-27 LAB — GLUCOSE, CAPILLARY: Glucose-Capillary: 80 mg/dL (ref 70–99)

## 2024-01-27 MED ORDER — VALACYCLOVIR HCL 1 G PO TABS
1000.0000 mg | ORAL_TABLET | Freq: Two times a day (BID) | ORAL | 0 refills | Status: AC
  Filled 2024-01-27: qty 10, 5d supply, fill #0

## 2024-01-27 MED ORDER — ACETAMINOPHEN 325 MG PO TABS
650.0000 mg | ORAL_TABLET | Freq: Four times a day (QID) | ORAL | 0 refills | Status: AC | PRN
  Filled 2024-01-27: qty 30, 4d supply, fill #0

## 2024-01-27 MED ORDER — LANTUS SOLOSTAR 100 UNIT/ML ~~LOC~~ SOPN
30.0000 [IU] | PEN_INJECTOR | Freq: Every day | SUBCUTANEOUS | 3 refills | Status: DC
  Filled 2024-01-27: qty 9, 30d supply, fill #0

## 2024-01-27 MED ORDER — METRONIDAZOLE 500 MG PO TABS
500.0000 mg | ORAL_TABLET | Freq: Two times a day (BID) | ORAL | 0 refills | Status: AC
  Filled 2024-01-27: qty 20, 10d supply, fill #0

## 2024-01-27 MED ORDER — IBUPROFEN 600 MG PO TABS
600.0000 mg | ORAL_TABLET | Freq: Four times a day (QID) | ORAL | 0 refills | Status: AC | PRN
  Filled 2024-01-27: qty 30, 8d supply, fill #0

## 2024-01-27 MED ORDER — LABETALOL HCL 200 MG PO TABS
200.0000 mg | ORAL_TABLET | Freq: Two times a day (BID) | ORAL | 6 refills | Status: AC
  Filled 2024-01-27: qty 30, 15d supply, fill #0

## 2024-01-27 MED ORDER — METRONIDAZOLE 500 MG PO TABS
500.0000 mg | ORAL_TABLET | Freq: Two times a day (BID) | ORAL | Status: DC
  Administered 2024-01-27: 500 mg via ORAL
  Filled 2024-01-27: qty 1

## 2024-01-27 MED ORDER — INSULIN PEN NEEDLE 32G X 4 MM MISC
0 refills | Status: AC
  Filled 2024-01-27: qty 100, 30d supply, fill #0

## 2024-01-27 MED ORDER — DOXYCYCLINE HYCLATE 100 MG PO TABS
100.0000 mg | ORAL_TABLET | Freq: Two times a day (BID) | ORAL | Status: DC
  Administered 2024-01-27: 100 mg via ORAL
  Filled 2024-01-27: qty 1

## 2024-01-27 MED ORDER — INSULIN GLARGINE-YFGN 100 UNIT/ML ~~LOC~~ SOLN
30.0000 [IU] | SUBCUTANEOUS | Status: DC
Start: 2024-01-27 — End: 2024-01-27
  Administered 2024-01-27: 30 [IU] via SUBCUTANEOUS
  Filled 2024-01-27: qty 0.3

## 2024-01-27 MED ORDER — DOXYCYCLINE HYCLATE 100 MG PO TABS
100.0000 mg | ORAL_TABLET | Freq: Two times a day (BID) | ORAL | 0 refills | Status: AC
  Filled 2024-01-27: qty 20, 10d supply, fill #0

## 2024-01-27 MED ORDER — NOVOLOG FLEXPEN 100 UNIT/ML ~~LOC~~ SOPN
6.0000 [IU] | PEN_INJECTOR | Freq: Three times a day (TID) | SUBCUTANEOUS | 3 refills | Status: DC
  Filled 2024-01-27: qty 6, 30d supply, fill #0

## 2024-01-27 NOTE — Discharge Summary (Signed)
 Physician Discharge Summary  Patient ID: Allison Mathews MRN: [id number] DOB/AGE: 35-Mar-1992 35 y.o.  Admit date: 01/23/2024 Discharge date: 01/27/2024  Admission Diagnoses: Fever of unknown etiology, Hypokalemia, Hyperglycemia due to T2DM, Primary HSV1  Discharge Diagnoses:  Principal Problem:   Fever Active Problems:   Hypokalemia   Hyperglycemia due to type 2 diabetes mellitus (HCC)   GBS bacteriuria   Genital HSV   Abortion with septicemia   Discharged Condition: stable  Hospital Course: 64bn [id number] at [redacted]w[redacted]d by LMP who presented to [facility] for lab abnormalities by PCP.  Pt was diagnosed with concern for sepsis and high fever of unknown etiology. Initially differential included UTI, primary HSV and potential for septic AB.  Due to the early state of pregnancy it was unclear if this was a septic AB or normal pregnancy.  US  did show gestational sac; however FHT not visualized.  Additionally, pt noted to have DKA with h.o T2DM.   Pt was treated with IV fluids, insulin  and antibiotics.  Once 48hr had passed, repeat hcg was noted to decline, which verified diagnosis of Septic AB/miscarriage and pt started to note vaginal spotting.  Pt underwent D&E on 10/21, see operative note regarding procedure.  Pt noted significant improved with normal white count, afebrile x 24hr and clinical improvement.  She was transitioned to oral antibiotics and discharged home with plan for 14 day course which will be additional 10 days of Doxy/Flagyl.  From a DM standpoint, insulin  was titrated and electrolytes were repleted as needed.  She was discharged home on Insulin  glargine 30units daily and Novolog  6u with meals with plan for close outpatient with her primary endocrinologist.  Pt also diagnosed with primary HSV during this admission.  Long discussion with patient and partner about transmission, future outbreaks as well as treatment.  For now plan to complete 7 day course and plan to follow up as  outpatient.  Addressed contraception- pt does desire a pregnancy in the future- advised waiting for at least one regular menses, better management     Consults: hospitalist  Significant Diagnostic Studies: labs:  Results for orders placed or performed during the hospital encounter of 01/23/24 (from the past 48 hours)  hCG, quantitative, pregnancy     Status: Abnormal   Collection Time: 01/25/24  8:27 PM  Result Value Ref Range   hCG, Beta Chain, Quant, S 210 (H) <5 mIU/mL    Comment:          GEST. AGE      CONC.  (mIU/mL)   <=1 WEEK        5 - 50     2 WEEKS       50 - 500     3 WEEKS       100 - 10,000     4 WEEKS     1,000 - 30,000     5 WEEKS     3,500 - 115,000   6-8 WEEKS     12,000 - 270,000    12 WEEKS     15,000 - 220,000        FEMALE AND NON-PREGNANT FEMALE:     LESS THAN 5 mIU/mL Performed at [facility], [address], [place], [place] [address]   Glucose, capillary     Status: Abnormal   Collection Time: 01/25/24 10:05 PM  Result Value Ref Range   Glucose-Capillary 123 (H) 70 - 99 mg/dL    Comment: Glucose reference range applies only to samples  taken after fasting for at least 8 hours.  CBC with Differential/Platelet     Status: Abnormal   Collection Time: 01/26/24  4:38 AM  Result Value Ref Range   WBC 6.3 4.0 - 10.5 K/uL   RBC 4.06 3.87 - 5.11 MIL/uL   Hemoglobin 11.8 (L) 12.0 - 15.0 g/dL   HCT 67.3 (L) 63.9 - 53.9 %   MCV 80.3 80.0 - 100.0 fL   MCH 29.1 26.0 - 34.0 pg   MCHC 36.2 (H) 30.0 - 36.0 g/dL   RDW 85.8 88.4 - 84.4 %   Platelets 132 (L) 150 - 400 K/uL    Comment: REPEATED TO VERIFY   nRBC 0.0 0.0 - 0.2 %   Neutrophils Relative % 74 %   Neutro Abs 4.7 1.7 - 7.7 K/uL   Lymphocytes Relative 18 %   Lymphs Abs 1.1 0.7 - 4.0 K/uL   Monocytes Relative 6 %   Monocytes Absolute 0.4 0.1 - 1.0 K/uL   Eosinophils Relative 2 %   Eosinophils Absolute 0.1 0.0 - 0.5 K/uL   Basophils Relative 0 %   Basophils Absolute 0.0 0.0 - 0.1 K/uL   WBC  Morphology See Note     Comment:  Morphology unremarkable   RBC Morphology MORPHOLOGY UNREMARKABLE     Comment:  Morphology unremarkable   Smear Review See Note     Comment:  Morphology unremarkable Performed at [facility], [address], [place], [place] [address]   Comprehensive metabolic panel     Status: Abnormal   Collection Time: 01/26/24  4:38 AM  Result Value Ref Range   Sodium 137 135 - 145 mmol/L   Potassium 3.3 (L) 3.5 - 5.1 mmol/L   Chloride 108 98 - 111 mmol/L   CO2 19 (L) 22 - 32 mmol/L   Glucose, Bld 115 (H) 70 - 99 mg/dL    Comment: Glucose reference range applies only to samples taken after fasting for at least 8 hours.   BUN <5 (L) 6 - 20 mg/dL   Creatinine, Ser 9.35 0.44 - 1.00 mg/dL   Calcium  7.7 (L) 8.9 - 10.3 mg/dL   Total Protein 5.5 (L) 6.5 - 8.1 g/dL   Albumin 2.6 (L) 3.5 - 5.0 g/dL   AST 34 15 - 41 U/L   ALT 41 0 - 44 U/L   Alkaline Phosphatase 44 38 - 126 U/L   Total Bilirubin 0.6 0.0 - 1.2 mg/dL   GFR, Estimated >39 >39 mL/min    Comment: (NOTE) Calculated using the CKD-EPI Creatinine Equation (2021)    Anion gap 10 5 - 15    Comment: Performed at [facility], [address], [place], [place] [address]  Glucose, capillary     Status: Abnormal   Collection Time: 01/26/24  6:36 AM  Result Value Ref Range   Glucose-Capillary 106 (H) 70 - 99 mg/dL    Comment: Glucose reference range applies only to samples taken after fasting for at least 8 hours.  Type and screen [facility] For surgery today.     Status: None   Collection Time: 01/26/24 10:05 AM  Result Value Ref Range   ABO/RH(D) O POS    Antibody Screen NEG    Sample Expiration      01/29/2024,2359 Performed at [facility], [address], [place], [place] [address]   Glucose, capillary     Status: Abnormal   Collection Time: 01/26/24 11:33 AM  Result Value  Ref Range   Glucose-Capillary 125 (H) 70 - 99 mg/dL    Comment: Glucose reference range  applies only to samples taken after fasting for at least 8 hours.  Magnesium      Status: None   Collection Time: 01/27/24  4:32 AM  Result Value Ref Range   Magnesium  2.1 1.7 - 2.4 mg/dL    Comment: Performed at [facility], [address], [place], [place] [address]  CBC with Differential/Platelet     Status: Abnormal   Collection Time: 01/27/24  4:35 AM  Result Value Ref Range   WBC 6.7 4.0 - 10.5 K/uL   RBC 3.87 3.87 - 5.11 MIL/uL   Hemoglobin 11.1 (L) 12.0 - 15.0 g/dL   HCT 69.0 (L) 63.9 - 53.9 %   MCV 79.8 (L) 80.0 - 100.0 fL   MCH 28.7 26.0 - 34.0 pg   MCHC 35.9 30.0 - 36.0 g/dL   RDW 86.0 88.4 - 84.4 %   Platelets 161 150 - 400 K/uL   nRBC 0.0 0.0 - 0.2 %   Neutrophils Relative % 61 %   Neutro Abs 4.1 1.7 - 7.7 K/uL   Lymphocytes Relative 27 %   Lymphs Abs 1.8 0.7 - 4.0 K/uL   Monocytes Relative 11 %   Monocytes Absolute 0.7 0.1 - 1.0 K/uL   Eosinophils Relative 0 %   Eosinophils Absolute 0.0 0.0 - 0.5 K/uL   Basophils Relative 0 %   Basophils Absolute 0.0 0.0 - 0.1 K/uL   WBC Morphology MORPHOLOGY UNREMARKABLE    RBC Morphology MORPHOLOGY UNREMARKABLE    Smear Review Normal platelet morphology    Immature Granulocytes 1 %   Abs Immature Granulocytes 0.04 0.00 - 0.07 K/uL    Comment: Performed at [facility], [address] [place], [place] [address]  Comprehensive metabolic panel     Status: Abnormal   Collection Time: 01/27/24  4:35 AM  Result Value Ref Range   Sodium 139 135 - 145 mmol/L   Potassium 3.5 3.5 - 5.1 mmol/L   Chloride 112 (H) 98 - 111 mmol/L   CO2 18 (L) 22 - 32 mmol/L   Glucose, Bld 104 (H) 70 - 99 mg/dL    Comment: Glucose reference range applies only to samples taken after fasting for at least 8 hours.   BUN 5 (L) 6 - 20 mg/dL   Creatinine, Ser 9.48 0.44 - 1.00 mg/dL   Calcium  7.6 (L) 8.9 - 10.3 mg/dL   Total Protein 5.5 (L) 6.5 - 8.1 g/dL   Albumin 2.5 (L) 3.5 - 5.0 g/dL   AST 34 15 - 41 U/L   ALT 38 0 - 44 U/L   Alkaline  Phosphatase 44 38 - 126 U/L   Total Bilirubin 0.5 0.0 - 1.2 mg/dL   GFR, Estimated >39 >39 mL/min    Comment: (NOTE) Calculated using the CKD-EPI Creatinine Equation (2021)    Anion gap 9 5 - 15    Comment: Performed at [facility], [address], [place], [place] [address]  Glucose, capillary     Status: None   Collection Time: 01/27/24  9:28 AM  Result Value Ref Range   Glucose-Capillary 80 70 - 99 mg/dL    Comment: Glucose reference range applies only to samples taken after fasting for at least 8 hours.   HSV 2 PCR positive; HSG IgG negative HCG 2836> 210   Treatments: IV hydration, antibiotics: IV Cefepime, Vanc, analgesia: tylenol  then ibuprofen ,  and surgery: dilation and evacuation  Discharge Exam: Blood pressure 139/80, pulse 70, temperature 98 F (36.7 C), temperature source Oral, resp. rate 17, height 4' 11 (1.499 m), weight 73.6 kg, last menstrual period 12/11/2023, SpO2 100%, unknown if currently breastfeeding. General appearance: alert, cooperative, and no distress Resp: clear to auscultation bilaterally Cardio: regular rate and rhythm GI: soft, non-tneder, no rebound, no guarding Extremities: no edema, no calf tenderness bilaterally Skin: warm and dry  Disposition: Discharge disposition: 01-Home or Self Care        Allergies as of 01/27/2024   No Known Allergies      Medication List     STOP taking these medications    glipiZIDE  5 MG tablet Commonly known as: GLUCOTROL    Insulin  Degludec FlexTouch 200 UNIT/ML Sopn   NIFEdipine  30 MG 24 hr tablet Commonly known as: PROCARDIA -XL/NIFEDICAL-XL   ondansetron  4 MG disintegrating tablet Commonly known as: ZOFRAN -ODT       TAKE these medications    Accu-Chek Guide Test test strip Generic drug: glucose blood Use as instructed   Accu-Chek Guide w/Device Kit Use as advised   Accu-Chek Softclix Lancets lancets Use as instructed 1x a day   acetaminophen  325 MG tablet Commonly known  as: Tylenol  Take 2 tablets (650 mg total) by mouth every 6 (six) hours as needed.   doxycycline 100 MG tablet Commonly known as: VIBRA-TABS Take 1 tablet (100 mg total) by mouth every 12 (twelve) hours for 10 days.   FreeStyle Libre 3 Plus Sensor Misc 1 each by Does not apply route every 14 (fourteen) days.   ibuprofen  600 MG tablet Commonly known as: ADVIL  Take 1 tablet (600 mg total) by mouth every 6 (six) hours as needed for mild pain (pain score 1-3).   Insulin  Pen Needle 32G X 4 MM Misc Use 4x a day   labetalol 200 MG tablet Commonly known as: NORMODYNE Take 1 tablet (200 mg total) by mouth 2 (two) times daily.   Lantus 100 UNIT/ML injection Generic drug: insulin  glargine Inject 0.3 mLs (30 Units total) into the skin daily.   loratadine 10 MG tablet Commonly known as: CLARITIN Take 10 mg by mouth daily.   metroNIDAZOLE 500 MG tablet Commonly known as: FLAGYL Take 1 tablet (500 mg total) by mouth every 12 (twelve) hours for 10 days.   NovoLOG  FlexPen 100 UNIT/ML FlexPen Generic drug: insulin  aspart Inject 6 Units into the skin 3 (three) times daily with meals. What changed: how much to take   valACYclovir 1000 MG tablet Commonly known as: VALTREX Take 1 tablet (1,000 mg total) by mouth 2 (two) times daily for 5 days.        Follow-up Information     [facility] Follow up in 2 week(s).   Specialty: Obstetrics and Gynecology Why: Please schedule a follow up appointment Contact information: [address] [place]  [address] [phone]                Signed: Delon CHRISTELLA Prude 01/27/2024, 9:37 AM

## 2024-01-27 NOTE — Progress Notes (Signed)
   01/27/24 1122  Departure Condition  Departure Condition Good  Mobility at [employer]  Patient/Caregiver Teaching Teach Back Method Used;Discharge instructions reviewed;Prescriptions reviewed;Pain management discussed;Follow-up care reviewed;Medications discussed;Patient/caregiver verbalized understanding  Departure Mode With significant other   Patient alert and oriented x4; pain stable.

## 2024-01-27 NOTE — Inpatient Diabetes Management (Signed)
 Inpatient Diabetes Program Recommendations  AACE/ADA: New Consensus Statement on Inpatient Glycemic Control (2015)  Target Ranges:  Prepandial:   less than 140 mg/dL      Peak postprandial:   less than 180 mg/dL (1-2 hours)      Critically ill patients:  140 - 180 mg/dL   Lab Results  Component Value Date   GLUCAP 80 01/27/2024   HGBA1C 6.5 (H) 01/23/2024    Discharge Recommendations: Other recommendations: Follow up with Endocrinogist in 2 weeks or sooner if glucose trends elevated Long acting recommendations: Insulin  Degludec (TRESIBA ) FlexTouch Pen 30 units QHS  Short acting recommendations:  Meal coverage ONLY Insulin  aspart (NOVOLOG ) FlexPen  6 units TID with meals     Use Adult Diabetes Insulin  Treatment Post Discharge order set.   Met with patient at bedside.  Reviewed goal glucose trends fasting and 2 hours postprandial.  Discussed importance of optimal glucose control and risks.  Also discussed goal carbohydrate per meal.   Asked her to follow up with endocrinologist, Dr. Trixie in 2 weeks or sooner if glucose trends elevated.  She verbalizes understanding.  She is wearing a [employer] which I placed on Monday and I have provided her with an extra.  The cost for her the the voucher at the pharmacy is $75/mo.    Thank you, Wyvonna Pinal, MSN, CDCES Diabetes Coordinator Inpatient Diabetes Program [phone] (team pager from 8a-5p)

## 2024-01-27 NOTE — Progress Notes (Signed)
 Progress Note   Patient: Allison Mathews FMW:[id number] DOB: 06/16/1990 DOA: 01/23/2024     4 DOS: the patient was seen and examined on 01/27/2024   Brief hospital course: 33 year old female with PMHx of insulin -dependent type 2 diabetes mellitus, HTN, HLD, sickle cell trait anemia, G4 P2-0-1-2 at 6 weeks 1 day presented to [facility] for evaluation after outpatient labs showed hyperglycemia with anion gap, elevated liver enzymes, and low potassium.  She reported lower extremity cramps as well as vaginal bleeding, lower abdominal cramps, decreased appetite, diarrhea, and malaise.  She was found to be febrile to 103 F, tachycardic to 130s, and hypotensive.  Sepsis protocol was initiated.  OB/GYN evaluated the patient, she was found to have dark blood in the vaginal vault but no active bleeding, with a closed cervix, small uterus, and mobile with mild tenderness.  She was started on vancomycin, cefepime, and Flagyl.  TRH was consulted due to concern for sepsis, electrolyte derangements, elevated LFTs, and possible DKA.  Ultimately, it was determined that the most likely cause of the patient's fever was a septic abortion and she underwent dilation and evacuation with OB/GYN on 01/26/2024.  Assessment and Plan:  # Septic abortion  # s/p dilation and evacuation on 10/21 - Management per OB/GYN. Was informed of likely discharge today.  - On vancomycin and cefepime. Discharge PO antibiotics per OBGYN  #HSV-2 infection - Continue valacyclovir  # Early DKA - Resolved  #Hypokalemia - Resolved  #Hypomagnesemia - Repeat level pending. Consider repeating in outpatient setting if discharged prior to results.  #Insulin -dependent T2DM - HgbA1c 6.5 (01/23/2024) - Follows with outpatient endocrinology - Overall patient's blood glucose trends have been labile during hospitalization due to n.p.o. status preoperatively and then receiving Decadron intraoperatively - Diabetes coordinator following - Home  regimen includes Tresiba  42 units daily, NovoLog  10-20 3 times daily, freestyle libre 3+ - Discharge regimen recommendation per diabetes coordinator: Tresiba  30 units nightly, NovoLog  6 units 3 times daily with meals.  - Patient to follow-up with outpatient endocrinology provider within 2 weeks or earlier if blood glucose levels are elevated  #Mild thrombocytopenia - Resolved     Subjective: Patient seen at bedside this morning.  Husband at bedside.  Patient was getting ready for discharge.  Patient was  Physical Exam: Vitals:   01/26/24 1610 01/26/24 1614 01/26/24 2030 01/27/24 0637  BP:  127/75 137/76 134/69  Pulse: 99 98 70 73  Resp:   16 16  Temp:  97.9 F (36.6 C) 97.6 F (36.4 C)   TempSrc:  Oral Oral   SpO2: 96%  98%   Weight:      Height:       Physical Exam Constitutional:      General: She is not in acute distress.    Appearance: She is not ill-appearing.  Cardiovascular:     Rate and Rhythm: Normal rate and regular rhythm.  Pulmonary:     Effort: Pulmonary effort is normal.     Breath sounds: Normal breath sounds.  Abdominal:     Palpations: Abdomen is soft.     Tenderness: There is no abdominal tenderness.  Musculoskeletal:     Right lower leg: No edema.     Left lower leg: No edema.  Skin:    General: Skin is warm and dry.     Capillary Refill: Capillary refill takes less than 2 seconds.  Neurological:     Mental Status: She is alert and oriented to person, place, and time. Mental status  is at baseline.  Psychiatric:     Comments: Sad affect     Family Communication: Husband at bedside   Time spent: 40 minutes  Author: Duffy Larch, MD 01/27/2024 7:17 AM  For on call review [email or web address].

## 2024-01-27 NOTE — Plan of Care (Signed)
  Problem: Fluid Volume: Goal: Hemodynamic stability will improve Outcome: Adequate for Discharge   Problem: Clinical Measurements: Goal: Diagnostic test results will improve Outcome: Adequate for Discharge Goal: Signs and symptoms of infection will decrease Outcome: Adequate for Discharge   Problem: Respiratory: Goal: Ability to maintain adequate ventilation will improve Outcome: Adequate for Discharge   Problem: Education: Goal: Knowledge of disease or condition will improve Outcome: Adequate for Discharge Goal: Knowledge of the prescribed therapeutic regimen will improve Outcome: Adequate for Discharge Goal: Individualized Educational Video(s) Outcome: Adequate for Discharge   Problem: Clinical Measurements: Goal: Complications related to the disease process, condition or treatment will be avoided or minimized Outcome: Adequate for Discharge   Problem: Education: Goal: Knowledge of General Education information will improve Description: Including pain rating scale, medication(s)/side effects and non-pharmacologic comfort measures Outcome: Adequate for Discharge   Problem: Health Behavior/Discharge Planning: Goal: Ability to manage health-related needs will improve Outcome: Adequate for Discharge   Problem: Clinical Measurements: Goal: Ability to maintain clinical measurements within normal limits will improve Outcome: Adequate for Discharge Goal: Will remain free from infection Outcome: Adequate for Discharge Goal: Diagnostic test results will improve Outcome: Adequate for Discharge Goal: Respiratory complications will improve Outcome: Adequate for Discharge Goal: Cardiovascular complication will be avoided Outcome: Adequate for Discharge   Problem: Activity: Goal: Risk for activity intolerance will decrease Outcome: Adequate for Discharge   Problem: Nutrition: Goal: Adequate nutrition will be maintained Outcome: Adequate for Discharge   Problem:  Coping: Goal: Level of anxiety will decrease Outcome: Adequate for Discharge   Problem: Elimination: Goal: Will not experience complications related to bowel motility Outcome: Adequate for Discharge Goal: Will not experience complications related to urinary retention Outcome: Adequate for Discharge   Problem: Pain Managment: Goal: General experience of comfort will improve and/or be controlled Outcome: Adequate for Discharge   Problem: Safety: Goal: Ability to remain free from injury will improve Outcome: Adequate for Discharge   Problem: Skin Integrity: Goal: Risk for impaired skin integrity will decrease Outcome: Adequate for Discharge   Problem: Education: Goal: Ability to describe self-care measures that may prevent or decrease complications (Diabetes Survival Skills Education) will improve Outcome: Adequate for Discharge Goal: Individualized Educational Video(s) Outcome: Adequate for Discharge   Problem: Coping: Goal: Ability to adjust to condition or change in health will improve Outcome: Adequate for Discharge   Problem: Fluid Volume: Goal: Ability to maintain a balanced intake and output will improve Outcome: Adequate for Discharge   Problem: Health Behavior/Discharge Planning: Goal: Ability to identify and utilize available resources and services will improve Outcome: Adequate for Discharge Goal: Ability to manage health-related needs will improve Outcome: Adequate for Discharge   Problem: Metabolic: Goal: Ability to maintain appropriate glucose levels will improve Outcome: Adequate for Discharge   Problem: Nutritional: Goal: Maintenance of adequate nutrition will improve Outcome: Adequate for Discharge Goal: Progress toward achieving an optimal weight will improve Outcome: Adequate for Discharge   Problem: Skin Integrity: Goal: Risk for impaired skin integrity will decrease Outcome: Adequate for Discharge   Problem: Tissue Perfusion: Goal: Adequacy  of tissue perfusion will improve Outcome: Adequate for Discharge

## 2024-01-28 ENCOUNTER — Ambulatory Visit: Payer: Self-pay | Admitting: Obstetrics and Gynecology

## 2024-01-28 LAB — CULTURE, BLOOD (ROUTINE X 2)
Culture: NO GROWTH
Culture: NO GROWTH

## 2024-01-29 LAB — SURGICAL PATHOLOGY

## 2024-01-29 MED ORDER — ONDANSETRON 8 MG PO TBDP: 8.0000 mg | ORAL_TABLET | Freq: Three times a day (TID) | ORAL | 0 refills | Status: AC | PRN

## 2024-02-19 NOTE — Telephone Encounter (Signed)
 Patient's significant other, Sergio Shropshire, came in to office today and picked up Carmina's FMLA papers.

## 2024-02-29 DIAGNOSIS — E1165 Type 2 diabetes mellitus with hyperglycemia: Secondary | ICD-10-CM | POA: Diagnosis not present

## 2024-02-29 DIAGNOSIS — E538 Deficiency of other specified B group vitamins: Secondary | ICD-10-CM | POA: Diagnosis not present

## 2024-02-29 DIAGNOSIS — Z0000 Encounter for general adult medical examination without abnormal findings: Secondary | ICD-10-CM | POA: Diagnosis not present

## 2024-02-29 DIAGNOSIS — I10 Essential (primary) hypertension: Secondary | ICD-10-CM | POA: Diagnosis not present

## 2024-02-29 DIAGNOSIS — E559 Vitamin D deficiency, unspecified: Secondary | ICD-10-CM | POA: Diagnosis not present

## 2024-03-17 ENCOUNTER — Telehealth: Payer: Self-pay | Admitting: Internal Medicine

## 2024-03-17 NOTE — Telephone Encounter (Signed)
 Return in about 1 month (around 11/26/2023).   Pt was not seen in September she was last seen in July. Please get pt schedule and send back to me and ill refill her medication.   Mentioned in the message:LAST APPOINTMENT DATE: @9 /24/2025

## 2024-03-17 NOTE — Telephone Encounter (Signed)
 MEDICATION: Tresiba   PHARMACY:  [facility] [address]  HAS THE PATIENT CONTACTED THEIR PHARMACY?    LAST REFILL:  @@LASTREFILL @  IS THIS A 90 DAY SUPPLY :   IS PATIENT OUT OF MEDICATION:   IF NOT; HOW MUCH IS LEFT:   LAST APPOINTMENT DATE: @9 /24/2025  NEXT APPOINTMENT DATE:@Visit  date not found  DO WE HAVE YOUR PERMISSION TO LEAVE A DETAILED MESSAGE?: Yes  OTHER COMMENTS:  Patient stated that she was seen in the ED recently and all of her diabetic Rxs were deleted and the new insulin  she was put on makes her sick.Please advise   **Let patient know to contact pharmacy at the end of the day to make sure medication is ready. **  ** Please notify patient to allow 48-72 hours to process**  **Encourage patient to contact the pharmacy for refills or they can request refills through [facility]**

## 2024-03-18 ENCOUNTER — Ambulatory Visit: Admitting: Internal Medicine

## 2024-03-18 ENCOUNTER — Encounter: Payer: Self-pay | Admitting: Internal Medicine

## 2024-03-18 VITALS — BP 122/70 | HR 80 | Ht 59.0 in | Wt 165.0 lb

## 2024-03-18 DIAGNOSIS — O24111 Pre-existing diabetes mellitus, type 2, in pregnancy, first trimester: Secondary | ICD-10-CM

## 2024-03-18 DIAGNOSIS — Z3A00 Weeks of gestation of pregnancy not specified: Secondary | ICD-10-CM

## 2024-03-18 DIAGNOSIS — E785 Hyperlipidemia, unspecified: Secondary | ICD-10-CM

## 2024-03-18 LAB — POCT GLYCOSYLATED HEMOGLOBIN (HGB A1C): Hemoglobin A1C: 7 % — AB (ref 4.0–5.6)

## 2024-03-18 MED ORDER — NOVOLOG FLEXPEN 100 UNIT/ML ~~LOC~~ SOPN: 6.0000 [IU] | PEN_INJECTOR | Freq: Three times a day (TID) | SUBCUTANEOUS | 3 refills | Status: AC

## 2024-03-18 MED ORDER — ACCU-CHEK SOFTCLIX LANCETS MISC: 3 refills | Status: AC

## 2024-03-18 MED ORDER — TRESIBA FLEXTOUCH 200 UNIT/ML ~~LOC~~ SOPN: 30.0000 [IU] | PEN_INJECTOR | Freq: Every day | SUBCUTANEOUS | 3 refills | Status: AC

## 2024-03-18 MED ORDER — ACCU-CHEK GUIDE TEST VI STRP: ORAL_STRIP | 3 refills | Status: AC

## 2024-03-18 NOTE — Progress Notes (Signed)
 Patient ID: Allison Mathews, female   DOB: [birth year], 33 y.o.   MRN: [id number]  HPI: Allison Mathews is a 33 y.o.-year-old female, returning for follow-up for DM2, dx in 2019, insulin -dependent since 2021, uncontrolled, without long-term complications. Pt. previously saw Dr. Kassie, but last visit with me 5 months ago.   Interim history: No increased urination, blurry vision, but has some nausea and fatigue. At last visit, in 10/2023, she was pregnant, [redacted] weeks along.  She unfortunately had to have a D&C 01/26/2024.  Reviewed HbA1c: Lab Results  Component Value Date   HGBA1C 6.5 (H) 01/23/2024   HGBA1C 9.3 (A) 10/26/2023   HGBA1C 7.5 (A) 02/24/2023   HGBA1C 7.6 10/02/2022   HGBA1C 6.3 (A) 03/27/2022   HGBA1C 7.0 (A) 11/25/2021   HGBA1C 7.7 (A) 08/09/2021   HGBA1C 9.3 (A) 03/20/2021  06/25/2023: HbA1c 6.5% 02/14/2021: HbA1c 10.7%  Pt is on a regimen of: - Tresiba  42 >> 36  >> 28 >> she increased to 46 units daily >> changed to Lantus  30 units daily - NovoLog  10-20 units 3x a day 15 min before meals - added 10/2023 >> 6-10 units 3x before meals - Januvia  100 mg daily -restarted 06/2023  >> stopped for the previous pregnancy - Glipizide  5 mg before a larger dinner -restarted 06/2023 >> stopped for the previous pregnancy She prev. stopped metformin  (nausea/hair loss), Januvia , and Ozempic due to weight loss, nausea, hair loss.  Pt checks her sugars 4x a day and they are (she had a defective sensor): - am: 93-100 >> 80-90 >> 90-120 >> 130-160 >> 239-279 >> 99-130 - 2h after b'fast: n/c - before lunch: 110 >> 120 >> n/c >> 311, 314 >> 100-130 - 2h after lunch: up to 170 >> 174-180 >> 130-140 >> n/c  - before dinner: 83 >> n/c >> 402 x1 >> 130-140 - 2h after dinner: 170-180, 443 >> 150-190, 200 >> 294 >> 150-160 - bedtime: n/c - nighttime: n/c Lowest sugar was 64 >> 90 >> 110 >> 99; she has hypoglycemia awareness at 90.  Highest sugar was  443 (pasta).>> 204>> 402 >>  174.  Glucometer:One Touch  - no CKD, last BUN/creatinine:  Lab Results  Component Value Date   BUN 5 (L) 01/27/2024   BUN <5 (L) 01/26/2024   CREATININE 0.51 01/27/2024   CREATININE 0.64 01/26/2024   Lab Results  Component Value Date   MICRALBCREAT NOTE 10/26/2023  Prev. On Cozaar 50 mg daily - off since 11/2021.  -+ HL; last set of lipids: Lab Results  Component Value Date   CHOL 174 02/24/2023   HDL 45.70 02/24/2023   LDLCALC 117 (H) 02/24/2023   TRIG 59.0 02/24/2023   CHOLHDL 4 02/24/2023  We stopped Crestor  5 mg daily previously due to the possibility of pregnancy.  I advised her to restart this in 03/2022, as she mentioned no desire for pregnancy at that time.  She was on it at last visit but we stopped it for her prev. pregnancy.  - + numbness and tingling in her feet.  Last foot exam 02/24/2023.  - Last eye exam 2024: No DR reportedly.  ROS: + see HPI  Past Medical History:  Diagnosis Date   Anemia    FeSO4 taken   Diabetes type 2 (HCC) 2022   Heart murmur    Hypercholesteremia    Hypertension    Scoliosis    Sickle cell trait    Yeast infection    NOT FREQUENT  Past Surgical History:  Procedure Laterality Date   DILATION AND EVACUATION N/A 01/26/2024   Procedure: DILATION AND EVACUATION, UTERUS;  Surgeon: Ozan, Jennifer, DO;  Location: [facility] OR;  Service: Gynecology;  Laterality: N/A;   NO PAST SURGERIES     Social History   Socioeconomic History   Marital status: Single    Spouse name: Not on file   Number of children: 1   Years of education: 43   Highest education level: Not on file  Occupational History   Occupation: [employer]  Tobacco Use   Smoking status: Never   Smokeless tobacco: Never  Vaping Use   Vaping status: Never Used  Substance and Sexual Activity   Alcohol use: No   Drug use: No   Sexual activity: Not Currently    Birth control/protection: None    Comment: currently pregnant  Other Topics Concern   Not on file   Social History Narrative   Not on file   Social Drivers of Health   Tobacco Use: Low Risk (01/26/2024)   Patient History    Smoking Tobacco Use: Never    Smokeless Tobacco Use: Never    Passive Exposure: Not on file  Financial Resource Strain: Not on file  Food Insecurity: No Food Insecurity (01/25/2024)   Epic    Worried About Programme Researcher, Broadcasting/film/video in the Last Year: Never true    Ran Out of Food in the Last Year: Never true  Transportation Needs: No Transportation Needs (01/25/2024)   Epic    Lack of Transportation (Medical): No    Lack of Transportation (Non-Medical): No  Physical Activity: Not on file  Stress: Not on file  Social Connections: Not on file  Intimate Partner Violence: Not At Risk (01/25/2024)   Epic    Fear of Current or Ex-Partner: No    Emotionally Abused: No    Physically Abused: No    Sexually Abused: No  Depression (PHQ2-9): Not on file  Alcohol Screen: Not on file  Housing: Low Risk (01/25/2024)   Epic    Unable to Pay for Housing in the Last Year: No    Number of Times Moved in the Last Year: 1    Homeless in the Last Year: No  Utilities: Not At Risk (01/25/2024)   Epic    Threatened with loss of utilities: No  Health Literacy: Not on file   Current Outpatient Medications on File Prior to Visit  Medication Sig Dispense Refill   Accu-Chek Softclix Lancets lancets Use as instructed 1x a day 100 each 3   acetaminophen  (TYLENOL ) 325 MG tablet Take 2 tablets (650 mg total) by mouth every 6 (six) hours as needed. 30 tablet 0   Blood Glucose Monitoring Suppl (ACCU-CHEK GUIDE) w/Device KIT Use as advised 1 kit 0   Continuous Glucose Sensor (FREESTYLE LIBRE 3 PLUS SENSOR) MISC 1 each by Does not apply route every 14 (fourteen) days. 6 each 3   glucose blood (ACCU-CHEK GUIDE TEST) test strip Use as instructed 100 each 12   ibuprofen  (ADVIL ) 600 MG tablet Take 1 tablet (600 mg total) by mouth every 6 (six) hours as needed for mild pain (pain score 1-3). 30  tablet 0   insulin  aspart (NOVOLOG  FLEXPEN) 100 UNIT/ML FlexPen Inject 6 Units into the skin 3 (three) times daily with meals. 30 mL 3   insulin  glargine (LANTUS  SOLOSTAR) 100 UNIT/ML Solostar Pen Inject 30 Units into the skin daily. 15 mL 3   Insulin  Pen Needle 32G  X 4 MM MISC Use 4x a day 400 each 3   Insulin  Pen Needle 32G X 4 MM MISC Use as directed up to four times daily 100 each 0   labetalol  (NORMODYNE ) 200 MG tablet Take 1 tablet (200 mg total) by mouth 2 (two) times daily. 30 tablet 6   loratadine (CLARITIN) 10 MG tablet Take 10 mg by mouth daily.     ondansetron  (ZOFRAN -ODT) 8 MG disintegrating tablet Take 1 tablet (8 mg total) by mouth every 8 (eight) hours as needed for nausea or vomiting. 20 tablet 0   No current facility-administered medications on file prior to visit.   No Known Allergies Family History  Problem Relation Age of Onset   Diabetes Father    Hypertension Paternal Aunt    Diabetes Paternal Grandmother    Anesthesia problems Neg Hx    Hypotension Neg Hx    Malignant hyperthermia Neg Hx    Pseudochol deficiency Neg Hx    ROS: + see HPI  Past Medical History:  Diagnosis Date   Anemia    FeSO4 taken   Diabetes type 2 (HCC) 2022   Heart murmur    Hypercholesteremia    Hypertension    Scoliosis    Sickle cell trait    Yeast infection    NOT FREQUENT   Past Surgical History:  Procedure Laterality Date   DILATION AND EVACUATION N/A 01/26/2024   Procedure: DILATION AND EVACUATION, UTERUS;  Surgeon: Ozan, Jennifer, DO;  Location: [facility] OR;  Service: Gynecology;  Laterality: N/A;   NO PAST SURGERIES     Social History   Socioeconomic History   Marital status: Single    Spouse name: Not on file   Number of children: 1   Years of education: 20   Highest education level: Not on file  Occupational History   Occupation: [employer]  Tobacco Use   Smoking status: Never   Smokeless tobacco: Never  Vaping Use   Vaping status: Never Used  Substance and  Sexual Activity   Alcohol use: No   Drug use: No   Sexual activity: Not Currently    Birth control/protection: None    Comment: currently pregnant  Other Topics Concern   Not on file  Social History Narrative   Not on file   Social Drivers of Health   Tobacco Use: Low Risk (01/26/2024)   Patient History    Smoking Tobacco Use: Never    Smokeless Tobacco Use: Never    Passive Exposure: Not on file  Financial Resource Strain: Not on file  Food Insecurity: No Food Insecurity (01/25/2024)   Epic    Worried About Programme Researcher, Broadcasting/film/video in the Last Year: Never true    Ran Out of Food in the Last Year: Never true  Transportation Needs: No Transportation Needs (01/25/2024)   Epic    Lack of Transportation (Medical): No    Lack of Transportation (Non-Medical): No  Physical Activity: Not on file  Stress: Not on file  Social Connections: Not on file  Intimate Partner Violence: Not At Risk (01/25/2024)   Epic    Fear of Current or Ex-Partner: No    Emotionally Abused: No    Physically Abused: No    Sexually Abused: No  Depression (PHQ2-9): Not on file  Alcohol Screen: Not on file  Housing: Low Risk (01/25/2024)   Epic    Unable to Pay for Housing in the Last Year: No    Number of Times Moved in  the Last Year: 1    Homeless in the Last Year: No  Utilities: Not At Risk (01/25/2024)   Epic    Threatened with loss of utilities: No  Health Literacy: Not on file   No Known Allergies Family History  Problem Relation Age of Onset   Diabetes Father    Hypertension Paternal Aunt    Diabetes Paternal Grandmother    Anesthesia problems Neg Hx    Hypotension Neg Hx    Malignant hyperthermia Neg Hx    Pseudochol deficiency Neg Hx    PE: BP 122/70   Pulse 80   Ht 4' 11 (1.499 m)   Wt 165 lb (74.8 kg)   SpO2 99%   BMI 33.33 kg/m  Wt Readings from Last 3 Encounters:  03/18/24 165 lb (74.8 kg)  01/26/24 162 lb 4.1 oz (73.6 kg)  10/26/23 164 lb (74.4 kg)   Constitutional:  overweight, in NAD Eyes:  EOMI, no exophthalmos ENT: no neck masses, no cervical lymphadenopathy Cardiovascular:  RRR, No MRG Respiratory: CTA B Musculoskeletal: no deformities Skin:no rashes Neurological: no tremor with outstretched hands  ASSESSMENT: 1. DM2, insulin -dependent, uncontrolled, without long-term complications, but with hyperglycemia - pt is now pregnant, in her first trimester  2. HL  PLAN:  1. Patient with longstanding, uncontrolled, type 2 diabetes, will return at last visit with very poor diabetes control and an HbA1c of 9.3%, while pregnant in the first trimester.  Blood sugars were increasing up to 400s, while before the pregnancy, she reported that these were exemplary.  At last visit, we discussed about target for blood sugars and HbA1c for pregnancy and we also stopped Januvia  and glipizide  and started NovoLog  along with continuing Tresiba .  I also recommended a CGM and send the prescription for the freestyle  3+ to her pharmacy.  Dexcom was not previously covered for her.  Unfortunately she lost her baby.  HbA1c improved afterwards, being 6.5% 2 months ago. -At today's visit, she tells me that she has more fatigue and nausea after she changed from Tresiba  to Lantus  approximately a week ago.  She would be interested to go back to Tresiba  if possible.  I called in a prescription for this to her pharmacy.  She does tell me that the Lantus  was actually changed in the hospital, not because the insurance did not cover it.  She is still on NovoLog , using lower doses then she needed at last visit.  Sugars remain fairly well-controlled.  For now, we will go ahead and switch to Tresiba  and continue the rest of the regimen. -She tried the freestyle libre sensor since last visit but she did not like it as it was giving her alarms (we discussed that she can change these) and also inaccurate readings.  For now, she would like to continue to check her blood sugars manually.  I refilled her  testing supplies. -Regarding her mild nausea and increased fatigue, we discussed about taking a pregnancy test at home.  She does desire another pregnancy. - I suggested to:  Patient Instructions  Please change Lantus  to: - Tresiba  30 units daily  Continue: - NovoLog  6-10 units 15 min before each meal.  Please return in 3-4 months with your sugar log.   - we checked her HbA1c: 7.0% (higher) - advised to check sugars at different times of the day - 4x a day, rotating check times - advised for yearly eye exams >> she is UTD - return to clinic in 3 months  2.  Hyperlipidemia - Latest lipid panel reviewed from a year ago showed an LDL above target, otherwise fractions at goal Lab Results  Component Value Date   CHOL 174 02/24/2023   HDL 45.70 02/24/2023   LDLCALC 117 (H) 02/24/2023   TRIG 59.0 02/24/2023   CHOLHDL 4 02/24/2023  - Previously on Crestor  5 mg daily but came off due to the previous pregnancy. - She is due for another lipid panel -I did order this today but she declined due to the higher price here in the office compared to PCPs office.  Lela Fendt, MD PhD [facility]

## 2024-03-18 NOTE — Telephone Encounter (Signed)
 After reviewing this pts med list. It states that she is on Lantus .   Will discuss at her visit  [facility]

## 2024-03-18 NOTE — Patient Instructions (Addendum)
 Please change Lantus  to: - Tresiba  30 units daily  Continue: - NovoLog  6-10 units 15 min before each meal.  Please return in 3-4 months with your sugar log.

## 2024-07-21 ENCOUNTER — Ambulatory Visit: Admitting: Internal Medicine
